# Patient Record
Sex: Female | Born: 1960 | Race: White | Hispanic: No | Marital: Married | Smoking: Never smoker
Health system: Southern US, Community
[De-identification: ages and names within clinical notes are randomized; demographics above are authoritative.]

## PROBLEM LIST (undated history)

## (undated) DIAGNOSIS — K121 Other forms of stomatitis: Secondary | ICD-10-CM

## (undated) DIAGNOSIS — M810 Age-related osteoporosis without current pathological fracture: Secondary | ICD-10-CM

## (undated) DIAGNOSIS — I10 Essential (primary) hypertension: Secondary | ICD-10-CM

## (undated) DIAGNOSIS — D649 Anemia, unspecified: Secondary | ICD-10-CM

## (undated) DIAGNOSIS — F32A Depression, unspecified: Secondary | ICD-10-CM

## (undated) DIAGNOSIS — M8580 Other specified disorders of bone density and structure, unspecified site: Secondary | ICD-10-CM

## (undated) DIAGNOSIS — F329 Major depressive disorder, single episode, unspecified: Secondary | ICD-10-CM

## (undated) DIAGNOSIS — F419 Anxiety disorder, unspecified: Secondary | ICD-10-CM

## (undated) HISTORY — PX: TONSILLECTOMY: SUR1361

## (undated) HISTORY — DX: Other specified disorders of bone density and structure, unspecified site: M85.80

## (undated) HISTORY — DX: Other forms of stomatitis: K12.1

## (undated) HISTORY — DX: Major depressive disorder, single episode, unspecified: F32.9

## (undated) HISTORY — PX: HERNIA REPAIR: SHX51

## (undated) HISTORY — DX: Anxiety disorder, unspecified: F41.9

## (undated) HISTORY — DX: Anemia, unspecified: D64.9

## (undated) HISTORY — DX: Depression, unspecified: F32.A

## (undated) HISTORY — DX: Essential (primary) hypertension: I10

## (undated) HISTORY — DX: Age-related osteoporosis without current pathological fracture: M81.0

---

## 1983-04-12 HISTORY — PX: FOOT SURGERY: SHX648

## 1997-08-26 ENCOUNTER — Other Ambulatory Visit: Admission: RE | Admit: 1997-08-26 | Discharge: 1997-08-26 | Payer: Self-pay | Admitting: Obstetrics and Gynecology

## 1998-10-08 ENCOUNTER — Other Ambulatory Visit: Admission: RE | Admit: 1998-10-08 | Discharge: 1998-10-08 | Payer: Self-pay | Admitting: Obstetrics and Gynecology

## 1999-10-12 ENCOUNTER — Other Ambulatory Visit: Admission: RE | Admit: 1999-10-12 | Discharge: 1999-10-12 | Payer: Self-pay | Admitting: Gynecology

## 2000-11-27 ENCOUNTER — Other Ambulatory Visit: Admission: RE | Admit: 2000-11-27 | Discharge: 2000-11-27 | Payer: Self-pay | Admitting: Gynecology

## 2002-06-03 ENCOUNTER — Other Ambulatory Visit: Admission: RE | Admit: 2002-06-03 | Discharge: 2002-06-03 | Payer: Self-pay | Admitting: Gynecology

## 2002-08-12 ENCOUNTER — Encounter: Payer: Self-pay | Admitting: Gynecology

## 2002-08-12 ENCOUNTER — Ambulatory Visit (HOSPITAL_COMMUNITY): Admission: RE | Admit: 2002-08-12 | Discharge: 2002-08-12 | Payer: Self-pay | Admitting: Gynecology

## 2003-10-10 ENCOUNTER — Ambulatory Visit (HOSPITAL_COMMUNITY): Admission: RE | Admit: 2003-10-10 | Discharge: 2003-10-10 | Payer: Self-pay | Admitting: Gynecology

## 2003-12-30 ENCOUNTER — Other Ambulatory Visit: Admission: RE | Admit: 2003-12-30 | Discharge: 2003-12-30 | Payer: Self-pay | Admitting: Gynecology

## 2004-12-06 ENCOUNTER — Ambulatory Visit (HOSPITAL_COMMUNITY): Admission: RE | Admit: 2004-12-06 | Discharge: 2004-12-06 | Payer: Self-pay | Admitting: Gynecology

## 2005-02-18 ENCOUNTER — Other Ambulatory Visit: Admission: RE | Admit: 2005-02-18 | Discharge: 2005-02-18 | Payer: Self-pay | Admitting: Gynecology

## 2005-03-25 ENCOUNTER — Encounter: Admission: RE | Admit: 2005-03-25 | Discharge: 2005-03-25 | Payer: Self-pay | Admitting: Gynecology

## 2005-09-23 ENCOUNTER — Encounter: Admission: RE | Admit: 2005-09-23 | Discharge: 2005-09-23 | Payer: Self-pay | Admitting: Gynecology

## 2006-01-06 ENCOUNTER — Ambulatory Visit (HOSPITAL_COMMUNITY): Admission: RE | Admit: 2006-01-06 | Discharge: 2006-01-06 | Payer: Self-pay | Admitting: Gynecology

## 2006-02-20 ENCOUNTER — Other Ambulatory Visit: Admission: RE | Admit: 2006-02-20 | Discharge: 2006-02-20 | Payer: Self-pay | Admitting: Gynecology

## 2007-01-09 ENCOUNTER — Ambulatory Visit (HOSPITAL_COMMUNITY): Admission: RE | Admit: 2007-01-09 | Discharge: 2007-01-09 | Payer: Self-pay | Admitting: Gynecology

## 2007-03-12 ENCOUNTER — Other Ambulatory Visit: Admission: RE | Admit: 2007-03-12 | Discharge: 2007-03-12 | Payer: Self-pay | Admitting: Gynecology

## 2007-12-21 ENCOUNTER — Ambulatory Visit: Payer: Self-pay | Admitting: Women's Health

## 2008-01-25 ENCOUNTER — Ambulatory Visit (HOSPITAL_COMMUNITY): Admission: RE | Admit: 2008-01-25 | Discharge: 2008-01-25 | Payer: Self-pay | Admitting: Gynecology

## 2008-05-02 ENCOUNTER — Other Ambulatory Visit: Admission: RE | Admit: 2008-05-02 | Discharge: 2008-05-02 | Payer: Self-pay | Admitting: Gynecology

## 2008-05-02 ENCOUNTER — Ambulatory Visit: Payer: Self-pay | Admitting: Women's Health

## 2008-05-02 ENCOUNTER — Encounter: Payer: Self-pay | Admitting: Women's Health

## 2009-01-30 ENCOUNTER — Ambulatory Visit: Payer: Self-pay | Admitting: Women's Health

## 2009-02-04 ENCOUNTER — Ambulatory Visit (HOSPITAL_COMMUNITY): Admission: RE | Admit: 2009-02-04 | Discharge: 2009-02-04 | Payer: Self-pay | Admitting: Gynecology

## 2009-05-08 ENCOUNTER — Other Ambulatory Visit: Admission: RE | Admit: 2009-05-08 | Discharge: 2009-05-08 | Payer: Self-pay | Admitting: Gynecology

## 2009-05-08 ENCOUNTER — Ambulatory Visit: Payer: Self-pay | Admitting: Women's Health

## 2009-09-11 ENCOUNTER — Ambulatory Visit: Payer: Self-pay | Admitting: Women's Health

## 2009-09-24 ENCOUNTER — Ambulatory Visit: Payer: Self-pay | Admitting: Women's Health

## 2009-11-11 ENCOUNTER — Other Ambulatory Visit: Admission: RE | Admit: 2009-11-11 | Discharge: 2009-11-11 | Payer: Self-pay | Admitting: Gynecology

## 2009-11-11 ENCOUNTER — Ambulatory Visit: Payer: Self-pay | Admitting: Women's Health

## 2010-02-05 ENCOUNTER — Ambulatory Visit (HOSPITAL_COMMUNITY): Admission: RE | Admit: 2010-02-05 | Discharge: 2010-02-05 | Payer: Self-pay | Admitting: Gynecology

## 2010-05-12 ENCOUNTER — Other Ambulatory Visit (HOSPITAL_COMMUNITY)
Admission: RE | Admit: 2010-05-12 | Discharge: 2010-05-12 | Disposition: A | Discharge status: Home / Self Care | Payer: BC Managed Care – PPO | Source: Ambulatory Visit | Attending: Gynecology | Admitting: Gynecology

## 2010-05-12 ENCOUNTER — Other Ambulatory Visit: Payer: Self-pay | Admitting: Women's Health

## 2010-05-12 ENCOUNTER — Encounter: Payer: Self-pay | Admitting: Women's Health

## 2010-05-12 ENCOUNTER — Encounter (INDEPENDENT_AMBULATORY_CARE_PROVIDER_SITE_OTHER): Payer: BC Managed Care – PPO | Admitting: Women's Health

## 2010-05-12 ENCOUNTER — Ambulatory Visit: Admit: 2010-05-12 | Payer: Self-pay | Admitting: Women's Health

## 2010-05-12 DIAGNOSIS — Z01419 Encounter for gynecological examination (general) (routine) without abnormal findings: Secondary | ICD-10-CM

## 2010-05-12 DIAGNOSIS — Z13220 Encounter for screening for lipoid disorders: Secondary | ICD-10-CM

## 2010-05-12 DIAGNOSIS — Z833 Family history of diabetes mellitus: Secondary | ICD-10-CM

## 2010-05-12 DIAGNOSIS — R87619 Unspecified abnormal cytological findings in specimens from cervix uteri: Secondary | ICD-10-CM | POA: Insufficient documentation

## 2011-01-21 ENCOUNTER — Other Ambulatory Visit: Payer: Self-pay | Admitting: Gynecology

## 2011-01-21 DIAGNOSIS — Z1231 Encounter for screening mammogram for malignant neoplasm of breast: Secondary | ICD-10-CM

## 2011-02-11 ENCOUNTER — Ambulatory Visit (HOSPITAL_COMMUNITY)
Admission: RE | Admit: 2011-02-11 | Discharge: 2011-02-11 | Disposition: A | Discharge status: Home / Self Care | Payer: BC Managed Care – PPO | Source: Ambulatory Visit | Attending: Gynecology | Admitting: Gynecology

## 2011-02-11 DIAGNOSIS — Z1231 Encounter for screening mammogram for malignant neoplasm of breast: Secondary | ICD-10-CM | POA: Insufficient documentation

## 2011-07-15 ENCOUNTER — Encounter: Payer: Self-pay | Admitting: Women's Health

## 2011-07-15 ENCOUNTER — Ambulatory Visit (INDEPENDENT_AMBULATORY_CARE_PROVIDER_SITE_OTHER): Payer: BC Managed Care – PPO | Admitting: Women's Health

## 2011-07-15 ENCOUNTER — Other Ambulatory Visit (HOSPITAL_COMMUNITY)
Admission: RE | Admit: 2011-07-15 | Discharge: 2011-07-15 | Disposition: A | Discharge status: Home / Self Care | Payer: BC Managed Care – PPO | Source: Ambulatory Visit | Attending: Obstetrics and Gynecology | Admitting: Obstetrics and Gynecology

## 2011-07-15 VITALS — BP 118/70 | Ht 67.0 in | Wt 108.0 lb

## 2011-07-15 DIAGNOSIS — N912 Amenorrhea, unspecified: Secondary | ICD-10-CM

## 2011-07-15 DIAGNOSIS — Z833 Family history of diabetes mellitus: Secondary | ICD-10-CM

## 2011-07-15 DIAGNOSIS — F419 Anxiety disorder, unspecified: Secondary | ICD-10-CM | POA: Insufficient documentation

## 2011-07-15 DIAGNOSIS — Z01419 Encounter for gynecological examination (general) (routine) without abnormal findings: Secondary | ICD-10-CM | POA: Insufficient documentation

## 2011-07-15 DIAGNOSIS — F411 Generalized anxiety disorder: Secondary | ICD-10-CM

## 2011-07-15 DIAGNOSIS — E079 Disorder of thyroid, unspecified: Secondary | ICD-10-CM

## 2011-07-15 NOTE — Progress Notes (Signed)
Lindsey Sanford 09-28-1960 [id number]    History:    The patient presents for annual exam. Regular monthly 3-4 day cycle until January. Amenorrheic for 3 months with increased hot flushes. History of normal mammograms, ascus on Pap in 2000 with normal Paps since. History of anxiety and depression currently without symptoms.  Past medical history, past surgical history, family history and social history were all reviewed and documented in the EPIC chart. 2 daughters doing well, one daughter graduating from [employer] planning to go to med school. Son doing well academically although having a few issues at  [facility].   ROS:  A  ROS was performed and pertinent positives and negatives are included in the history.  Exam:  Filed Vitals:   07/15/11 1104  BP: 118/70    General appearance:  Normal Head/Neck:  Normal, without cervical or supraclavicular adenopathy. Thyroid:  Symmetrical, normal in size, without palpable masses or nodularity. Respiratory  Effort:  Normal  Auscultation:  Clear without wheezing or rhonchi Cardiovascular  Auscultation:  Regular rate, without rubs, murmurs or gallops  Edema/varicosities:  Not grossly evident Abdominal  Soft,nontender, without masses, guarding or rebound.  Liver/spleen:  No organomegaly noted  Hernia:  None appreciated  Skin  Inspection:  Grossly normal  Palpation:  Grossly normal Neurologic/psychiatric  Orientation:  Normal with appropriate conversation.  Mood/affect:  Normal  Genitourinary    Breasts: Examined lying and sitting.     Right: Without masses, retractions, discharge or axillary adenopathy.     Left: Without masses, retractions, discharge or axillary adenopathy.   Inguinal/mons:  Normal without inguinal adenopathy  External genitalia:  Normal  BUS/Urethra/Skene's glands:  Normal  Bladder:  Normal  Vagina:  Normal  Cervix:  Normal  Uterus:   normal in size, shape and contour.  Midline and mobile  Adnexa/parametria:      Rt: Without masses or tenderness.   Lt: Without masses or tenderness.  Anus and perineum: Normal  Digital rectal exam: Normal sphincter tone without palpated masses or tenderness  Assessment/Plan:  51 y.o.  MWF G3 P3 for annual exam amenorrheic for 3 months with menopausal symptoms.  Menopausal symptoms  Plan: CBC, FSH, TSH, glucose, UA and Pap. Instructed to call if further bleeding. If FSH elevated proceed with the density. Instructed to schedule screening colonoscopy. HRT pros, cons, discussed declines at this time will try vitamin E twice a day. SBE's, annual mammogram, calcium rich diet, continue exercise and vitamin D 2000 daily.    Harrington Challenger [facility], 12:45 PM 07/15/2011

## 2011-07-15 NOTE — Patient Instructions (Signed)

## 2011-07-16 LAB — URINALYSIS W MICROSCOPIC + REFLEX CULTURE
Casts: NONE SEEN
Crystals: NONE SEEN
Glucose, UA: NEGATIVE mg/dL
Hgb urine dipstick: NEGATIVE
Leukocytes, UA: NEGATIVE
Specific Gravity, Urine: 1.008 (ref 1.005–1.030)
pH: 7 (ref 5.0–8.0)

## 2011-07-16 LAB — CBC WITH DIFFERENTIAL/PLATELET
Basophils Relative: 1 % (ref 0–1)
Eosinophils Absolute: 0.1 10*3/uL (ref 0.0–0.7)
HCT: 43.5 % (ref 36.0–46.0)
Hemoglobin: 13.8 g/dL (ref 12.0–15.0)
Lymphs Abs: 1.6 10*3/uL (ref 0.7–4.0)
MCH: 29.2 pg (ref 26.0–34.0)
MCHC: 31.7 g/dL (ref 30.0–36.0)
Monocytes Absolute: 0.5 10*3/uL (ref 0.1–1.0)
Monocytes Relative: 7 % (ref 3–12)
Neutro Abs: 4.5 10*3/uL (ref 1.7–7.7)

## 2011-07-16 LAB — GLUCOSE, RANDOM: Glucose, Bld: 91 mg/dL (ref 70–99)

## 2011-10-06 ENCOUNTER — Telehealth: Payer: Self-pay | Admitting: *Deleted

## 2011-10-06 NOTE — Telephone Encounter (Signed)
Pt was told to call if bleeding should occur again,  Cycle started yesterday, very light, no cycle in 6 months prior to this cycle. Please advise

## 2011-10-07 NOTE — Telephone Encounter (Signed)
Telephone call, states had a light period she is still within the first year of no cycles. Had an elevated FSH in March having less hot flushes at this time. Reviewed definition of menopause is on no HRT instructed to call if further bleeding.

## 2012-05-10 ENCOUNTER — Other Ambulatory Visit: Payer: Self-pay | Admitting: Women's Health

## 2012-05-10 DIAGNOSIS — Z1231 Encounter for screening mammogram for malignant neoplasm of breast: Secondary | ICD-10-CM

## 2012-05-16 ENCOUNTER — Ambulatory Visit (HOSPITAL_COMMUNITY)
Admission: RE | Admit: 2012-05-16 | Discharge: 2012-05-16 | Disposition: A | Discharge status: Home / Self Care | Payer: BC Managed Care – PPO | Source: Ambulatory Visit | Attending: Women's Health | Admitting: Women's Health

## 2012-05-16 DIAGNOSIS — Z1231 Encounter for screening mammogram for malignant neoplasm of breast: Secondary | ICD-10-CM | POA: Insufficient documentation

## 2012-05-17 ENCOUNTER — Other Ambulatory Visit: Payer: Self-pay | Admitting: Women's Health

## 2012-05-17 DIAGNOSIS — R928 Other abnormal and inconclusive findings on diagnostic imaging of breast: Secondary | ICD-10-CM

## 2012-05-18 ENCOUNTER — Other Ambulatory Visit: Payer: Self-pay | Admitting: *Deleted

## 2012-05-18 DIAGNOSIS — N63 Unspecified lump in unspecified breast: Secondary | ICD-10-CM

## 2012-05-30 ENCOUNTER — Ambulatory Visit
Admission: RE | Admit: 2012-05-30 | Discharge: 2012-05-30 | Disposition: A | Discharge status: Home / Self Care | Payer: BC Managed Care – PPO | Source: Ambulatory Visit | Attending: Women's Health | Admitting: Women's Health

## 2012-07-20 ENCOUNTER — Ambulatory Visit (INDEPENDENT_AMBULATORY_CARE_PROVIDER_SITE_OTHER): Payer: BC Managed Care – PPO | Admitting: Women's Health

## 2012-07-20 ENCOUNTER — Encounter: Payer: Self-pay | Admitting: Women's Health

## 2012-07-20 VITALS — BP 132/62 | Ht 67.0 in | Wt 115.0 lb

## 2012-07-20 DIAGNOSIS — Z13220 Encounter for screening for lipoid disorders: Secondary | ICD-10-CM

## 2012-07-20 DIAGNOSIS — Z833 Family history of diabetes mellitus: Secondary | ICD-10-CM

## 2012-07-20 DIAGNOSIS — Z01419 Encounter for gynecological examination (general) (routine) without abnormal findings: Secondary | ICD-10-CM

## 2012-07-20 LAB — CBC WITH DIFFERENTIAL/PLATELET
Basophils Absolute: 0.1 10*3/uL (ref 0.0–0.1)
Basophils Relative: 2 % — ABNORMAL HIGH (ref 0–1)
Eosinophils Absolute: 0.1 10*3/uL (ref 0.0–0.7)
Eosinophils Relative: 3 % (ref 0–5)
HCT: 41.8 % (ref 36.0–46.0)
Hemoglobin: 13.8 g/dL (ref 12.0–15.0)
MCH: 28.8 pg (ref 26.0–34.0)
MCHC: 33 g/dL (ref 30.0–36.0)
MCV: 87.1 fL (ref 78.0–100.0)
Monocytes Absolute: 0.4 10*3/uL (ref 0.1–1.0)
Monocytes Relative: 8 % (ref 3–12)
Neutro Abs: 2.3 10*3/uL (ref 1.7–7.7)
RDW: 14.1 % (ref 11.5–15.5)

## 2012-07-20 LAB — LIPID PANEL
LDL Cholesterol: 94 mg/dL (ref 0–99)
Total CHOL/HDL Ratio: 2.1 Ratio
VLDL: 8 mg/dL (ref 0–40)

## 2012-07-20 NOTE — Progress Notes (Signed)
RICHARD HOLZ 04/10/1961 [id number]    History:    The patient presents for annual exam.  Regular monthly cycle since last  May, had skipped several cycles had an elevated FSH of 69, 07/2011 with hot flushes which have now resolved. History of normal Paps and mammograms. Has not had a colonoscopy. Has had some problems with depression, doing better on no medication.   Past medical history, past surgical history, family history and social history were all reviewed and documented in the EPIC chart. 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 Judie Grieve at [employer] doing much better. Katie graduated from [employer], Lowella Dandy at [employer] applying to med school doing well.   ROS:  A  ROS was performed and pertinent positives and negatives are included in the history.  Exam:  Filed Vitals:   07/20/12 0911  BP: 132/62    General appearance:  Normal Head/Neck:  Normal, without cervical or supraclavicular adenopathy. Thyroid:  Symmetrical, normal in size, without palpable masses or nodularity. Respiratory  Effort:  Normal  Auscultation:  Clear without wheezing or rhonchi Cardiovascular  Auscultation:  Regular rate, without rubs, murmurs or gallops  Edema/varicosities:  Not grossly evident Abdominal  Soft,nontender, without masses, guarding or rebound.  Liver/spleen:  No organomegaly noted  Hernia:  None appreciated  Skin  Inspection:  Grossly normal  Palpation:  Grossly normal Neurologic/psychiatric  Orientation:  Normal with appropriate conversation.  Mood/affect:  Normal  Genitourinary    Breasts: Examined lying and sitting.     Right: Without masses, retractions, discharge or axillary adenopathy.     Left: Without masses, retractions, discharge or axillary adenopathy.   Inguinal/mons:  Normal without inguinal adenopathy  External genitalia:  Normal  BUS/Urethra/Skene's glands:  Normal  Bladder:  Normal  Vagina:  Normal  Cervix:  Normal  Uterus:   normal in size, shape and contour.   Midline and mobile  Adnexa/parametria:     Rt: Without masses or tenderness.   Lt: Without masses or tenderness.  Anus and perineum: Normal  Digital rectal exam: Normal sphincter tone without palpated masses or tenderness  Assessment/Plan:  52 y.o.  M. WF G3 P99for annual exam with no complaints.  Normal GYN exam Anxiety and depression stable on no medication  Plan: Menopause reviewed, instructed to return to office after 3 minutes cycles for repeat FSH. CBC, glucose, lipid panel, UA, Pap normal 2013, new screening guidelines reviewed. SBE's, continue annual mammogram, calcium rich diet, vitamin D 2000 daily and regular exercise encouraged. Instructed to schedule screening colonoscopy, reviewed importance of screening.     Harrington Challenger [facility], 1:37 PM 07/20/2012

## 2012-07-20 NOTE — Patient Instructions (Addendum)

## 2012-07-21 LAB — URINALYSIS W MICROSCOPIC + REFLEX CULTURE
Bilirubin Urine: NEGATIVE
Crystals: NONE SEEN
Glucose, UA: NEGATIVE mg/dL
Leukocytes, UA: NEGATIVE
Protein, ur: NEGATIVE mg/dL
Specific Gravity, Urine: 1.019 (ref 1.005–1.030)
Squamous Epithelial / LPF: NONE SEEN
pH: 5.5 (ref 5.0–8.0)

## 2012-07-24 LAB — URINE CULTURE

## 2012-07-25 ENCOUNTER — Other Ambulatory Visit: Payer: Self-pay | Admitting: Women's Health

## 2012-07-25 DIAGNOSIS — N390 Urinary tract infection, site not specified: Secondary | ICD-10-CM

## 2012-07-25 MED ORDER — CIPROFLOXACIN HCL 250 MG PO TABS: 250.0000 mg | ORAL_TABLET | Freq: Two times a day (BID) | ORAL | Status: DC

## 2012-07-26 ENCOUNTER — Encounter: Payer: Self-pay | Admitting: Women's Health

## 2012-08-10 ENCOUNTER — Other Ambulatory Visit: Payer: BC Managed Care – PPO

## 2012-08-10 DIAGNOSIS — N390 Urinary tract infection, site not specified: Secondary | ICD-10-CM

## 2012-08-11 LAB — URINALYSIS W MICROSCOPIC + REFLEX CULTURE
Bacteria, UA: NONE SEEN
Casts: NONE SEEN
Crystals: NONE SEEN
Glucose, UA: NEGATIVE mg/dL
Hgb urine dipstick: NEGATIVE
Ketones, ur: NEGATIVE mg/dL
Leukocytes, UA: NEGATIVE
Nitrite: NEGATIVE
Specific Gravity, Urine: 1.007 (ref 1.005–1.030)
pH: 7.5 (ref 5.0–8.0)

## 2013-02-14 ENCOUNTER — Other Ambulatory Visit: Payer: Self-pay

## 2013-05-22 ENCOUNTER — Other Ambulatory Visit: Payer: Self-pay | Admitting: Women's Health

## 2013-05-22 DIAGNOSIS — Z1231 Encounter for screening mammogram for malignant neoplasm of breast: Secondary | ICD-10-CM

## 2013-05-29 ENCOUNTER — Ambulatory Visit (HOSPITAL_COMMUNITY)
Admission: RE | Admit: 2013-05-29 | Discharge: 2013-05-29 | Disposition: A | Discharge status: Home / Self Care | Payer: BC Managed Care – PPO | Source: Ambulatory Visit | Attending: Women's Health | Admitting: Women's Health

## 2013-05-29 DIAGNOSIS — Z1231 Encounter for screening mammogram for malignant neoplasm of breast: Secondary | ICD-10-CM | POA: Insufficient documentation

## 2013-05-30 ENCOUNTER — Encounter: Payer: Self-pay | Admitting: Women's Health

## 2013-10-18 ENCOUNTER — Encounter: Payer: Self-pay | Admitting: Women's Health

## 2013-10-18 ENCOUNTER — Ambulatory Visit (INDEPENDENT_AMBULATORY_CARE_PROVIDER_SITE_OTHER): Payer: BC Managed Care – PPO | Admitting: Women's Health

## 2013-10-18 VITALS — BP 134/86 | Ht 67.0 in | Wt 110.8 lb

## 2013-10-18 DIAGNOSIS — E079 Disorder of thyroid, unspecified: Secondary | ICD-10-CM

## 2013-10-18 DIAGNOSIS — Z01419 Encounter for gynecological examination (general) (routine) without abnormal findings: Secondary | ICD-10-CM

## 2013-10-18 DIAGNOSIS — M722 Plantar fascial fibromatosis: Secondary | ICD-10-CM

## 2013-10-18 DIAGNOSIS — F411 Generalized anxiety disorder: Secondary | ICD-10-CM

## 2013-10-18 DIAGNOSIS — Z13220 Encounter for screening for lipoid disorders: Secondary | ICD-10-CM

## 2013-10-18 LAB — LIPID PANEL
CHOL/HDL RATIO: 2.1 ratio
CHOLESTEROL: 158 mg/dL (ref 0–200)
HDL: 75 mg/dL (ref >39)
LDL Cholesterol: 71 mg/dL (ref 0–99)
Triglycerides: 61 mg/dL (ref <150)
VLDL: 12 mg/dL (ref 0–40)

## 2013-10-18 LAB — CBC WITH DIFFERENTIAL/PLATELET
Basophils Absolute: 0 10*3/uL (ref 0.0–0.1)
Basophils Relative: 1 % (ref 0–1)
EOS ABS: 0.1 10*3/uL (ref 0.0–0.7)
Eosinophils Relative: 3 % (ref 0–5)
HCT: 39.4 % (ref 36.0–46.0)
HEMOGLOBIN: 13.4 g/dL (ref 12.0–15.0)
LYMPHS ABS: 1.3 10*3/uL (ref 0.7–4.0)
LYMPHS PCT: 34 % (ref 12–46)
MCH: 28.7 pg (ref 26.0–34.0)
MCHC: 34 g/dL (ref 30.0–36.0)
MCV: 84.4 fL (ref 78.0–100.0)
MONOS PCT: 11 % (ref 3–12)
Monocytes Absolute: 0.4 10*3/uL (ref 0.1–1.0)
NEUTROS ABS: 2 10*3/uL (ref 1.7–7.7)
NEUTROS PCT: 51 % (ref 43–77)
Platelets: 229 10*3/uL (ref 150–400)
RBC: 4.67 MIL/uL (ref 3.87–5.11)
RDW: 13.4 % (ref 11.5–15.5)
WBC: 3.9 10*3/uL — AB (ref 4.0–10.5)

## 2013-10-18 LAB — COMPREHENSIVE METABOLIC PANEL
ALT: 15 U/L (ref 0–35)
AST: 17 U/L (ref 0–37)
Albumin: 3.9 g/dL (ref 3.5–5.2)
Alkaline Phosphatase: 49 U/L (ref 39–117)
BILIRUBIN TOTAL: 0.5 mg/dL (ref 0.2–1.2)
BUN: 12 mg/dL (ref 6–23)
CALCIUM: 8.7 mg/dL (ref 8.4–10.5)
CO2: 24 meq/L (ref 19–32)
CREATININE: 0.82 mg/dL (ref 0.50–1.10)
Chloride: 103 mEq/L (ref 96–112)
Glucose, Bld: 97 mg/dL (ref 70–99)
Potassium: 3.6 mEq/L (ref 3.5–5.3)
Sodium: 135 mEq/L (ref 135–145)
Total Protein: 6.7 g/dL (ref 6.0–8.3)

## 2013-10-18 LAB — TSH: TSH: 2.043 u[IU]/mL (ref 0.350–4.500)

## 2013-10-18 MED ORDER — ESCITALOPRAM OXALATE 10 MG PO TABS: 10.0000 mg | ORAL_TABLET | Freq: Every day | ORAL | Status: DC

## 2013-10-18 NOTE — Patient Instructions (Signed)
Health Recommendations for Postmenopausal Women Respected and ongoing research has looked at the most common causes of death, disability, and poor quality of life in postmenopausal women. The causes include heart disease, diseases of blood vessels, diabetes, depression, cancer, and bone loss (osteoporosis). Many things can be done to help lower the chances of developing these and other common problems: CARDIOVASCULAR DISEASE Heart Disease: A heart attack is a medical emergency. Know the signs and symptoms of a heart attack. Below are things women can do to reduce their risk for heart disease.   Do not smoke. If you smoke, quit.  Aim for a healthy weight. Being overweight causes many preventable deaths. Eat a healthy and balanced diet and drink an adequate amount of liquids.  Get moving. Make a commitment to be more physically active. Aim for 30 minutes of activity on most, if not all days of the week.  Eat for heart health. Choose a diet that is low in saturated fat and cholesterol and eliminate trans fat. Include whole grains, vegetables, and fruits. Read and understand the labels on food containers before buying.  Know your numbers. Ask your caregiver to check your blood pressure, cholesterol (total, HDL, LDL, triglycerides) and blood glucose. Work with your caregiver on improving your entire clinical picture.  High blood pressure. Limit or stop your table salt intake (try salt substitute and food seasonings). Avoid salty foods and drinks. Read labels on food containers before buying. Eating well and exercising can help control high blood pressure. STROKE  Stroke is a medical emergency. Stroke may be the result of a blood clot in a blood vessel in the brain or by a brain hemorrhage (bleeding). Know the signs and symptoms of a stroke. To lower the risk of developing a stroke:  Avoid fatty foods.  Quit smoking.  Control your diabetes, blood pressure, and irregular heart rate. THROMBOPHLEBITIS  (BLOOD CLOT) OF THE LEG  Becoming overweight and leading a stationary lifestyle may also contribute to developing blood clots. Controlling your diet and exercising will help lower the risk of developing blood clots. CANCER SCREENING  Breast Cancer: Take steps to reduce your risk of breast cancer.  You should practice "breast self-awareness." This means understanding the normal appearance and feel of your breasts and should include breast self-examination. Any changes detected, no matter how small, should be reported to your caregiver.  After age 40, you should have a clinical breast exam (CBE) every year.  Starting at age 40, you should consider having a mammogram (breast X-ray) every year.  If you have a family history of breast cancer, talk to your caregiver about genetic screening.  If you are at high risk for breast cancer, talk to your caregiver about having an MRI and a mammogram every year.  Intestinal or Stomach Cancer: Tests to consider are a rectal exam, fecal occult blood, sigmoidoscopy, and colonoscopy. Women who are high risk may need to be screened at an earlier age and more often.  Cervical Cancer:  Beginning at age 30, you should have a Pap test every 3 years as long as the past 3 Pap tests have been normal.  If you have had past treatment for cervical cancer or a condition that could lead to cancer, you need Pap tests and screening for cancer for at least 20 years after your treatment.  If you had a hysterectomy for a problem that was not cancer or a condition that could lead to cancer, then you no longer need Pap tests.    If you are between ages 65 and 70, and you have had normal Pap tests going back 10 years, you no longer need Pap tests.  If Pap tests have been discontinued, risk factors (such as a new sexual partner) need to be reassessed to determine if screening should be resumed.  Some medical problems can increase the chance of getting cervical cancer. In these  cases, your caregiver may recommend more frequent screening and Pap tests.  Uterine Cancer: If you have vaginal bleeding after reaching menopause, you should notify your caregiver.  Ovarian cancer: Other than yearly pelvic exams, there are no reliable tests available to screen for ovarian cancer at this time except for yearly pelvic exams.  Lung Cancer: Yearly chest X-rays can detect lung cancer and should be done on high risk women, such as cigarette smokers and women with chronic lung disease (emphysema).  Skin Cancer: A complete body skin exam should be done at your yearly examination. Avoid overexposure to the sun and ultraviolet light lamps. Use a strong sun block cream when in the sun. All of these things are important in lowering the risk of skin cancer. MENOPAUSE Menopause Symptoms: Hormone therapy products are effective for treating symptoms associated with menopause:  Moderate to severe hot flashes.  Night sweats.  Mood swings.  Headaches.  Tiredness.  Loss of sex drive.  Insomnia.  Other symptoms. Hormone replacement carries certain risks, especially in older women. Women who use or are thinking about using estrogen or estrogen with progestin treatments should discuss that with their caregiver. Your caregiver will help you understand the benefits and risks. The ideal dose of hormone replacement therapy is not known. The Food and Drug Administration (FDA) has concluded that hormone therapy should be used only at the lowest doses and for the shortest amount of time to reach treatment goals.  OSTEOPOROSIS Protecting Against Bone Loss and Preventing Fracture: If you use hormone therapy for prevention of bone loss (osteoporosis), the risks for bone loss must outweigh the risk of the therapy. Ask your caregiver about other medications known to be safe and effective for preventing bone loss and fractures. To guard against bone loss or fractures, the following is recommended:  If  you are less than age 50, take 1000 mg of calcium and at least 600 mg of Vitamin D per day.  If you are greater than age 50 but less than age 70, take 1200 mg of calcium and at least 600 mg of Vitamin D per day.  If you are greater than age 70, take 1200 mg of calcium and at least 800 mg of Vitamin D per day. Smoking and excessive alcohol intake increases the risk of osteoporosis. Eat foods rich in calcium and vitamin D and do weight bearing exercises several times a week as your caregiver suggests. DIABETES Diabetes Melitus: If you have Type I or Type 2 diabetes, you should keep your blood sugar under control with diet, exercise and recommended medication. Avoid too many sweets, starchy and fatty foods. Being overweight can make control more difficult. COGNITION AND MEMORY Cognition and Memory: Menopausal hormone therapy is not recommended for the prevention of cognitive disorders such as Alzheimer's disease or memory loss.  DEPRESSION  Depression may occur at any age, but is common in elderly women. The reasons may be because of physical, medical, social (loneliness), or financial problems and needs. If you are experiencing depression because of medical problems and control of symptoms, talk to your caregiver about this. Physical activity and   exercise may help with mood and sleep. Community and volunteer involvement may help your sense of value and worth. If you have depression and you feel that the problem is getting worse or becoming severe, talk to your caregiver about treatment options that are best for you. ACCIDENTS  Accidents are common and can be serious in the elderly woman. Prepare your house to prevent accidents. Eliminate throw rugs, place hand bars in the bath, shower and toilet areas. Avoid wearing high heeled shoes or walking on wet, snowy, and icy areas. Limit or stop driving if you have vision or hearing problems, or you feel you are unsteady with you movements and  reflexes. HEPATITIS C Hepatitis C is a type of viral infection affecting the liver. It is spread mainly through contact with blood from an infected person. It can be treated, but if left untreated, it can lead to severe liver damage over years. Many people who are infected do not know that the virus is in their blood. If you are a "baby-boomer", it is recommended that you have one screening test for Hepatitis C. IMMUNIZATIONS  Several immunizations are important to consider having during your senior years, including:   Tetanus, diptheria, and pertussis booster shot.  Influenza every year before the flu season begins.  Pneumonia vaccine.  Shingles vaccine.  Others as indicated based on your specific needs. Talk to your caregiver about these. Document Released: 05/20/2005 Document Revised: 03/14/2012 Document Reviewed: 01/14/2008 [facility] Patient Information 2015 [place], [place]. This information is not intended to replace advice given to you by your health care provider. Make sure you discuss any questions you have with your health care provider.

## 2013-10-18 NOTE — Progress Notes (Signed)
Lindsey Sanford 1960/04/12 [id number]    History:    Presents for annual exam.  Continues to have irregular cycles, has had a cycle last 2-3 months, prior amenorrheic for 6 months. Diaphragm. Vasomotor symptoms. Continues to struggle with anxiety and depression has been resistant to medication but would like to try. Reports home life, children all doing well, job change in past year.  Normal Pap and mammogram history. Has not had a colonoscopy  Past medical history, past surgical history, family history and social history were all reviewed and documented in the EPIC chart. 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 [name] starting med school, Scientist, research (physical sciences). graduating from [employer] graduating from [employer].  Father diabetes.  ROS:  A  12 point ROS was performed and pertinent positives and negatives are included.  Exam:  Filed Vitals:   10/18/13 0813  BP: 134/86    General appearance:  Normal Thyroid:  Symmetrical, normal in size, without palpable masses or nodularity. Respiratory  Auscultation:  Clear without wheezing or rhonchi Cardiovascular  Auscultation:  Regular rate, without rubs, murmurs or gallops  Edema/varicosities:  Not grossly evident Abdominal  Soft,nontender, without masses, guarding or rebound.  Liver/spleen:  No organomegaly noted  Hernia:  None appreciated  Skin  Inspection:  Grossly normal   Breasts: Examined lying and sitting.     Right: Without masses, retractions, discharge or axillary adenopathy.     Left: Without masses, retractions, discharge or axillary adenopathy. Gentitourinary   Inguinal/mons:  Normal without inguinal adenopathy  External genitalia:  Normal  BUS/Urethra/Skene's glands:  Normal  Vagina:  Normal  Cervix:  Normal  Uterus:   normal in size, shape and contour.  Midline and mobile  Adnexa/parametria:     Rt: Without masses or tenderness.   Lt: Without masses or tenderness.  Anus and perineum: Normal  Digital rectal exam: Normal sphincter  tone without palpated masses or tenderness  Assessment/Plan:  53 y.o. MWF G3P3 for annual exam.     Perimenopausal/diaphragm for contraception Anxiety/depression  Plan: Counseling suggested, Lexapro 10 mg start with half tablet daily and increase to full tablet after 2 weeks. Reviewed importance of self-care, increasing leisure activities, delligating at work. SBE's, continue annual mammogram,3D tomography reviewed and encouraged history of dense breast. Calcium rich diet, vitamin D 2000 daily encouraged. CBC, comprehensive metabolic panel, TSH, lipid panel, UA, Pap normal 2013, new screening guidelines reviewed. Aware of importance of screening colonoscopy will schedule.   Note: This dictation was prepared with Dragon/digital dictation.  Any transcriptional errors that result are unintentional. Huel Cote [facility], 9:30 AM 10/18/2013

## 2013-10-19 LAB — URINALYSIS W MICROSCOPIC + REFLEX CULTURE
BILIRUBIN URINE: NEGATIVE
Bacteria, UA: NONE SEEN
Casts: NONE SEEN
Crystals: NONE SEEN
Glucose, UA: NEGATIVE mg/dL
HGB URINE DIPSTICK: NEGATIVE
Ketones, ur: NEGATIVE mg/dL
Leukocytes, UA: NEGATIVE
Nitrite: NEGATIVE
PROTEIN: NEGATIVE mg/dL
Specific Gravity, Urine: 1.015 (ref 1.005–1.030)
Urobilinogen, UA: 0.2 mg/dL (ref 0.0–1.0)
pH: 6 (ref 5.0–8.0)

## 2013-10-21 ENCOUNTER — Encounter: Payer: Self-pay | Admitting: Women's Health

## 2013-10-24 ENCOUNTER — Telehealth: Payer: Self-pay | Admitting: *Deleted

## 2013-10-24 NOTE — Telephone Encounter (Signed)
Please call and have her take half tablet daily, may need to take every other day if she is taking a half tablet now and increase more gradually.

## 2013-10-24 NOTE — Telephone Encounter (Signed)
Pt informed with the below note. 

## 2013-10-24 NOTE — Telephone Encounter (Signed)
Pt was prescribed lexapro 10 mg on 10/18/13 she asked if feeling "sick is normal'? C/o body weakness, fatigue if this is common for body to adjust to medication. Please advise

## 2013-11-12 ENCOUNTER — Telehealth: Payer: Self-pay | Admitting: *Deleted

## 2013-11-12 NOTE — Telephone Encounter (Signed)
I called and informed her that her orthotics are here.  She asked if we open at 8 in the morning.  I told her yes.  She stated thanks for calling.

## 2013-11-12 NOTE — Telephone Encounter (Signed)
I sent a pair of orthotics out to be refurbished.  Just checking to see if they are back in the office.

## 2013-12-25 ENCOUNTER — Telehealth: Payer: Self-pay | Admitting: *Deleted

## 2013-12-25 NOTE — Telephone Encounter (Signed)
Pt called requesting name of orthopedic office, I left message on pt voicemail [facility]al[facility] with number [phone]

## 2014-02-10 ENCOUNTER — Encounter: Payer: Self-pay | Admitting: Women's Health

## 2014-05-13 ENCOUNTER — Other Ambulatory Visit: Payer: Self-pay | Admitting: Orthopedic Surgery

## 2014-05-13 DIAGNOSIS — M25511 Pain in right shoulder: Secondary | ICD-10-CM

## 2014-06-02 ENCOUNTER — Ambulatory Visit
Admission: RE | Admit: 2014-06-02 | Discharge: 2014-06-02 | Disposition: A | Discharge status: Home / Self Care | Payer: PRIVATE HEALTH INSURANCE | Source: Ambulatory Visit | Attending: Orthopedic Surgery | Admitting: Orthopedic Surgery

## 2014-06-02 DIAGNOSIS — M25511 Pain in right shoulder: Secondary | ICD-10-CM

## 2014-08-12 ENCOUNTER — Other Ambulatory Visit: Payer: Self-pay | Admitting: Women's Health

## 2014-08-12 DIAGNOSIS — Z1231 Encounter for screening mammogram for malignant neoplasm of breast: Secondary | ICD-10-CM

## 2014-08-22 ENCOUNTER — Ambulatory Visit (HOSPITAL_COMMUNITY)
Admission: RE | Admit: 2014-08-22 | Discharge: 2014-08-22 | Disposition: A | Discharge status: Home / Self Care | Payer: PRIVATE HEALTH INSURANCE | Source: Ambulatory Visit | Attending: Women's Health | Admitting: Women's Health

## 2014-08-22 DIAGNOSIS — Z1231 Encounter for screening mammogram for malignant neoplasm of breast: Secondary | ICD-10-CM | POA: Insufficient documentation

## 2014-11-05 ENCOUNTER — Ambulatory Visit (INDEPENDENT_AMBULATORY_CARE_PROVIDER_SITE_OTHER): Payer: PRIVATE HEALTH INSURANCE | Admitting: Women's Health

## 2014-11-05 ENCOUNTER — Other Ambulatory Visit (HOSPITAL_COMMUNITY)
Admission: RE | Admit: 2014-11-05 | Discharge: 2014-11-05 | Disposition: A | Discharge status: Home / Self Care | Payer: PRIVATE HEALTH INSURANCE | Source: Ambulatory Visit | Attending: Women's Health | Admitting: Women's Health

## 2014-11-05 ENCOUNTER — Encounter: Payer: Self-pay | Admitting: Women's Health

## 2014-11-05 VITALS — BP 118/80 | Ht 67.0 in | Wt 113.0 lb

## 2014-11-05 DIAGNOSIS — Z1151 Encounter for screening for human papillomavirus (HPV): Secondary | ICD-10-CM | POA: Insufficient documentation

## 2014-11-05 DIAGNOSIS — Z01419 Encounter for gynecological examination (general) (routine) without abnormal findings: Secondary | ICD-10-CM | POA: Diagnosis present

## 2014-11-05 LAB — CBC WITH DIFFERENTIAL/PLATELET
BASOS ABS: 0.1 10*3/uL (ref 0.0–0.1)
Basophils Relative: 2 % — ABNORMAL HIGH (ref 0–1)
EOS ABS: 0.2 10*3/uL (ref 0.0–0.7)
Eosinophils Relative: 4 % (ref 0–5)
HCT: 39.2 % (ref 36.0–46.0)
Hemoglobin: 13.2 g/dL (ref 12.0–15.0)
LYMPHS ABS: 1.7 10*3/uL (ref 0.7–4.0)
Lymphocytes Relative: 36 % (ref 12–46)
MCH: 29.3 pg (ref 26.0–34.0)
MCHC: 33.7 g/dL (ref 30.0–36.0)
MCV: 87.1 fL (ref 78.0–100.0)
MONO ABS: 0.4 10*3/uL (ref 0.1–1.0)
MPV: 9 fL (ref 8.6–12.4)
Monocytes Relative: 8 % (ref 3–12)
NEUTROS ABS: 2.4 10*3/uL (ref 1.7–7.7)
NEUTROS PCT: 50 % (ref 43–77)
Platelets: 237 10*3/uL (ref 150–400)
RBC: 4.5 MIL/uL (ref 3.87–5.11)
RDW: 13.8 % (ref 11.5–15.5)
WBC: 4.8 10*3/uL (ref 4.0–10.5)

## 2014-11-05 NOTE — Progress Notes (Signed)
Lindsey Sanford 1961/01/19 [id number]    History:    Presents for annual exam.  Postmenopausal 1 year with elevated FSH. Has not had a colonoscopy. History of  Pap in 2011 ascus with negative HR HPV  and normal mammograms.  Past medical history, past surgical history, family history and social history were all reviewed and documented in the EPIC chart. 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 Lindsey Sanford med school at [employer] graduated from [employer] living in [place] doing well. 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 Father diabetes. Mother doing well , living at [facility].  ROS:  A ROS was performed and pertinent positives and negatives are included.  Exam:  Filed Vitals:   11/05/14 0857  BP: 118/80    General appearance:  Normal Thyroid:  Symmetrical, normal in size, without palpable masses or nodularity. Respiratory  Auscultation:  Clear without wheezing or rhonchi Cardiovascular  Auscultation:  Regular rate, without rubs, murmurs or gallops  Edema/varicosities:  Not grossly evident Abdominal  Soft,nontender, without masses, guarding or rebound.  Liver/spleen:  No organomegaly noted  Hernia:  None appreciated  Skin  Inspection:  Grossly normal   Breasts: Examined lying and sitting.     Right: Without masses, retractions, discharge or axillary adenopathy.     Left: Without masses, retractions, discharge or axillary adenopathy. Gentitourinary   Inguinal/mons:  Normal without inguinal adenopathy  External genitalia:  Normal  BUS/Urethra/Skene's glands:  Normal  Vagina:  Normal  Cervix:  Normal  Uterus:  normal in size, shape and contour.  Midline and mobile  Adnexa/parametria:     Rt: Without masses or tenderness.   Lt: Without masses or tenderness.  Anus and perineum: Normal  Digital rectal exam: Normal sphincter tone without palpated masses or tenderness  Assessment/Plan:  54 y.o. MWF G3P3 for annual exam.     Postmenopausal/no  bleeding greater than one year with elevated FSH  Menopausal symptoms Anxiety/depression-no medication  [place]: Options for menopausal symptoms reviewed HRT, declines. Reviewed importance of continuing regular exercise, leisure activities, vaginal lubricants with intercourse. SBE's, continue annual screening mammogram, 3-D tomography reviewed and encouraged history of dense breasts. CBC, CMP, vitamin D, UA, Pap with HR HPV typing new screening guidelines reviewed. (Excellent lipid panel 2015.) Denies need for counseling or medication. [facility] information given instructed to schedule colonoscopy. Will check DEXA next year.   Huel Cote WHNP, 11:59 AM 11/05/2014

## 2014-11-06 LAB — COMPREHENSIVE METABOLIC PANEL
ALK PHOS: 53 U/L (ref 33–130)
ALT: 13 U/L (ref 6–29)
AST: 17 U/L (ref 10–35)
Albumin: 4 g/dL (ref 3.6–5.1)
BILIRUBIN TOTAL: 0.4 mg/dL (ref 0.2–1.2)
BUN: 12 mg/dL (ref 7–25)
CO2: 24 mEq/L (ref 20–31)
Calcium: 8.7 mg/dL (ref 8.6–10.4)
Chloride: 100 mEq/L (ref 98–110)
Creat: 0.73 mg/dL (ref 0.50–1.05)
GLUCOSE: 84 mg/dL (ref 65–99)
POTASSIUM: 3.6 meq/L (ref 3.5–5.3)
Sodium: 132 mEq/L — ABNORMAL LOW (ref 135–146)
Total Protein: 6.5 g/dL (ref 6.1–8.1)

## 2014-11-06 LAB — VITAMIN D 25 HYDROXY (VIT D DEFICIENCY, FRACTURES): Vit D, 25-Hydroxy: 45 ng/mL (ref 30–100)

## 2014-11-06 LAB — CYTOLOGY - PAP

## 2015-11-11 ENCOUNTER — Encounter: Payer: Self-pay | Admitting: Women's Health

## 2015-11-11 ENCOUNTER — Ambulatory Visit (INDEPENDENT_AMBULATORY_CARE_PROVIDER_SITE_OTHER): Payer: PRIVATE HEALTH INSURANCE | Admitting: Women's Health

## 2015-11-11 VITALS — BP 119/78 | Ht 67.0 in | Wt 108.0 lb

## 2015-11-11 DIAGNOSIS — Z1329 Encounter for screening for other suspected endocrine disorder: Secondary | ICD-10-CM

## 2015-11-11 DIAGNOSIS — Z13220 Encounter for screening for lipoid disorders: Secondary | ICD-10-CM

## 2015-11-11 DIAGNOSIS — M25539 Pain in unspecified wrist: Secondary | ICD-10-CM

## 2015-11-11 DIAGNOSIS — Z01419 Encounter for gynecological examination (general) (routine) without abnormal findings: Secondary | ICD-10-CM | POA: Diagnosis not present

## 2015-11-11 LAB — RHEUMATOID FACTOR: Rhuematoid fact SerPl-aCnc: 15 IU/mL — ABNORMAL HIGH (ref <=14)

## 2015-11-11 LAB — COMPREHENSIVE METABOLIC PANEL
ALT: 21 U/L (ref 6–29)
AST: 25 U/L (ref 10–35)
Albumin: 4.3 g/dL (ref 3.6–5.1)
Alkaline Phosphatase: 70 U/L (ref 33–130)
BUN: 13 mg/dL (ref 7–25)
CO2: 24 mmol/L (ref 20–31)
CREATININE: 0.91 mg/dL (ref 0.50–1.05)
Calcium: 9.4 mg/dL (ref 8.6–10.4)
Chloride: 101 mmol/L (ref 98–110)
GLUCOSE: 97 mg/dL (ref 65–99)
Potassium: 4.3 mmol/L (ref 3.5–5.3)
Sodium: 136 mmol/L (ref 135–146)
Total Bilirubin: 0.5 mg/dL (ref 0.2–1.2)
Total Protein: 7.4 g/dL (ref 6.1–8.1)

## 2015-11-11 LAB — CBC WITH DIFFERENTIAL/PLATELET
BASOS ABS: 54 {cells}/uL (ref 0–200)
Basophils Relative: 1 %
EOS ABS: 108 {cells}/uL (ref 15–500)
Eosinophils Relative: 2 %
HEMATOCRIT: 43.6 % (ref 35.0–45.0)
HEMOGLOBIN: 14.7 g/dL (ref 11.7–15.5)
LYMPHS PCT: 29 %
Lymphs Abs: 1566 cells/uL (ref 850–3900)
MCH: 29.6 pg (ref 27.0–33.0)
MCHC: 33.7 g/dL (ref 32.0–36.0)
MCV: 87.9 fL (ref 80.0–100.0)
MONO ABS: 540 {cells}/uL (ref 200–950)
MPV: 9.6 fL (ref 7.5–12.5)
Monocytes Relative: 10 %
NEUTROS PCT: 58 %
Neutro Abs: 3132 cells/uL (ref 1500–7800)
Platelets: 264 10*3/uL (ref 140–400)
RBC: 4.96 MIL/uL (ref 3.80–5.10)
RDW: 13.6 % (ref 11.0–15.0)
WBC: 5.4 10*3/uL (ref 3.8–10.8)

## 2015-11-11 LAB — LIPID PANEL
Cholesterol: 207 mg/dL — ABNORMAL HIGH (ref 125–200)
HDL: 104 mg/dL (ref >=46)
LDL CALC: 91 mg/dL (ref <130)
Total CHOL/HDL Ratio: 2 Ratio (ref <=5.0)
Triglycerides: 60 mg/dL (ref <150)
VLDL: 12 mg/dL (ref <30)

## 2015-11-11 LAB — TSH: TSH: 1.12 mIU/L

## 2015-11-11 NOTE — Progress Notes (Signed)
Lindsey Sanford 1960/08/21 [id number]    History:    Presents for annual exam.  Postmenopausal 2 years with no bleeding no HRT. Normal Pap and mammogram history. Has not had a colonoscopy. 2013 T dap. Vitamin D 45.  Past medical history, past surgical history, family history and social history were all reviewed and documented in the EPIC chart. 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 Mother healthy. Son living in [place] doing sustainable farming. Daughter in med school, other daughter in [place].   ROS:  A ROS was performed and pertinent positives and negatives are included.  Exam:  Vitals:   11/11/15 0934  BP: 119/78     General appearance:  Normal Thyroid:  Symmetrical, normal in size, without palpable masses or nodularity. Respiratory  Auscultation:  Clear without wheezing or rhonchi Cardiovascular  Auscultation:  Regular rate, without rubs, murmurs or gallops  Edema/varicosities:  Not grossly evident Abdominal  Soft,nontender, without masses, guarding or rebound.  Liver/spleen:  No organomegaly noted  Hernia:  None appreciated  Skin  Inspection:  Grossly normal   Breasts: Examined lying and sitting.     Right: Without masses, retractions, discharge or axillary adenopathy.     Left: Without masses, retractions, discharge or axillary adenopathy. Gentitourinary   Inguinal/mons:  Normal without inguinal adenopathy  External genitalia:  Normal  BUS/Urethra/Skene's glands:  Normal  Vagina:  Atrophic  Cervix:  Normal  Uterus:   normal in size, shape and contour.  Midline and mobile  Adnexa/parametria:     Rt: Without masses or tenderness.   Lt: Without masses or tenderness.  Anus and perineum: Normal  Digital rectal exam: Normal sphincter tone without palpated masses or tenderness  Assessment/Plan:  55 y.o. MWF G3 P3 for annual exam with complaint of joint discomfort with mild swelling occasionally in hands.  Postmenopausal/no bleeding/no  HRT Probable arthritis  Plan: CBC, CMP, lipid panel, TSH, rheumatoid factor, UA, Pap normal with negative HR HPV 2016 new screening guidelines reviewed. SBE's, overdue for mammogram instructed to schedule 3-D tomography history of dense breasts. Continue active lifestyle, calcium rich diet, vitamin D 1000 daily encouraged. History of a normal vitamin D level. Reviewed importance of screening colonoscopy, [facility] information given and reviewed instructed to schedule. Reviewed joint discomfort and hands and mild swelling occasionally is more likely related to arthritis, reviewed importance of drinking enough fluids preventing overuse of joints. Will check a DEXA next year.  Huel Cote [facility], 10:35 AM 11/11/2015

## 2015-11-11 NOTE — Patient Instructions (Signed)
Schedule mammogram  [id number]  Colonoscopy  Dr Celine Ahr  [phone]  Menopause is a normal process in which your reproductive ability comes to an end. This process happens gradually over a span of months to years, usually between the ages of 33 and 79. Menopause is complete when you have missed 12 consecutive menstrual periods. It is important to talk with your health care provider about some of the most common conditions that affect postmenopausal women, such as heart disease, cancer, and bone loss (osteoporosis). Adopting a healthy lifestyle and getting preventive care can help to promote your health and wellness. Those actions can also lower your chances of developing some of these common conditions. WHAT SHOULD I KNOW ABOUT MENOPAUSE? During menopause, you may experience a number of symptoms, such as:  Moderate-to-severe hot flashes.  Night sweats.  Decrease in sex drive.  Mood swings.  Headaches.  Tiredness.  Irritability.  Memory problems.  Insomnia. Choosing to treat or not to treat menopausal changes is an individual decision that you make with your health care provider. WHAT SHOULD I KNOW ABOUT HORMONE REPLACEMENT THERAPY AND SUPPLEMENTS? Hormone therapy products are effective for treating symptoms that are associated with menopause, such as hot flashes and night sweats. Hormone replacement carries certain risks, especially as you become older. If you are thinking about using estrogen or estrogen with progestin treatments, discuss the benefits and risks with your health care provider. WHAT SHOULD I KNOW ABOUT HEART DISEASE AND STROKE? Heart disease, heart attack, and stroke become more likely as you age. This may be due, in part, to the hormonal changes that your body experiences during menopause. These can affect how your body processes dietary fats, triglycerides, and cholesterol. Heart attack and stroke are both medical emergencies. There are many things that you can  do to help prevent heart disease and stroke:  Have your blood pressure checked at least every 1-2 years. High blood pressure causes heart disease and increases the risk of stroke.  If you are 59-60 years old, ask your health care provider if you should take aspirin to prevent a heart attack or a stroke.  Do not use any tobacco products, including cigarettes, chewing tobacco, or electronic cigarettes. If you need help quitting, ask your health care provider.  It is important to eat a healthy diet and maintain a healthy weight.  Be sure to include plenty of vegetables, fruits, low-fat dairy products, and lean protein.  Avoid eating foods that are high in solid fats, added sugars, or salt (sodium).  Get regular exercise. This is one of the most important things that you can do for your health.  Try to exercise for at least 150 minutes each week. The type of exercise that you do should increase your heart rate and make you sweat. This is known as moderate-intensity exercise.  Try to do strengthening exercises at least twice each week. Do these in addition to the moderate-intensity exercise.  Know your numbers.Ask your health care provider to check your cholesterol and your blood glucose. Continue to have your blood tested as directed by your health care provider. WHAT SHOULD I KNOW ABOUT CANCER SCREENING? There are several types of cancer. Take the following steps to reduce your risk and to catch any cancer development as early as possible. Breast Cancer  Practice breast self-awareness.  This means understanding how your breasts normally appear and feel.  It also means doing regular breast self-exams. Let your health care provider know about any changes, no matter how  small.  If you are 40 or older, have a clinician do a breast exam (clinical breast exam or CBE) every year. Depending on your age, family history, and medical history, it may be recommended that you also have a yearly breast  X-ray (mammogram).  If you have a family history of breast cancer, talk with your health care provider about genetic screening.  If you are at high risk for breast cancer, talk with your health care provider about having an MRI and a mammogram every year.  Breast cancer (BRCA) gene test is recommended for women who have family members with BRCA-related cancers. Results of the assessment will determine the need for genetic counseling and BRCA1 and for BRCA2 testing. BRCA-related cancers include these types:  Breast. This occurs in males or females.  Ovarian.  Tubal. This may also be called fallopian tube cancer.  Cancer of the abdominal or pelvic lining (peritoneal cancer).  Prostate.  Pancreatic. Cervical, Uterine, and Ovarian Cancer Your health care provider may recommend that you be screened regularly for cancer of the pelvic organs. These include your ovaries, uterus, and vagina. This screening involves a pelvic exam, which includes checking for microscopic changes to the surface of your cervix (Pap test).  For women ages 21-65, health care providers may recommend a pelvic exam and a Pap test every three years. For women ages 98-65, they may recommend the Pap test and pelvic exam, combined with testing for human papilloma virus (HPV), every five years. Some types of HPV increase your risk of cervical cancer. Testing for HPV may also be done on women of any age who have unclear Pap test results.  Other health care providers may not recommend any screening for nonpregnant women who are considered low risk for pelvic cancer and have no symptoms. Ask your health care provider if a screening pelvic exam is right for you.  If you have had past treatment for cervical cancer or a condition that could lead to cancer, you need Pap tests and screening for cancer for at least 20 years after your treatment. If Pap tests have been discontinued for you, your risk factors (such as having a new sexual  partner) need to be reassessed to determine if you should start having screenings again. Some women have medical problems that increase the chance of getting cervical cancer. In these cases, your health care provider may recommend that you have screening and Pap tests more often.  If you have a family history of uterine cancer or ovarian cancer, talk with your health care provider about genetic screening.  If you have vaginal bleeding after reaching menopause, tell your health care provider.  There are currently no reliable tests available to screen for ovarian cancer. Lung Cancer Lung cancer screening is recommended for adults 74-56 years old who are at high risk for lung cancer because of a history of smoking. A yearly low-dose CT scan of the lungs is recommended if you:  Currently smoke.  Have a history of at least 30 pack-years of smoking and you currently smoke or have quit within the past 15 years. A pack-year is smoking an average of one pack of cigarettes per day for one year. Yearly screening should:  Continue until it has been 15 years since you quit.  Stop if you develop a health problem that would prevent you from having lung cancer treatment. Colorectal Cancer  This type of cancer can be detected and can often be prevented.  Routine colorectal cancer screening usually  begins at age 50 and continues through age 3.  If you have risk factors for colon cancer, your health care provider may recommend that you be screened at an earlier age.  If you have a family history of colorectal cancer, talk with your health care provider about genetic screening.  Your health care provider may also recommend using home test kits to check for hidden blood in your stool.  A small camera at the end of a tube can be used to examine your colon directly (sigmoidoscopy or colonoscopy). This is done to check for the earliest forms of colorectal cancer.  Direct examination of the colon should be  repeated every 5-10 years until age 65. However, if early forms of precancerous polyps or small growths are found or if you have a family history or genetic risk for colorectal cancer, you may need to be screened more often. Skin Cancer  Check your skin from head to toe regularly.  Monitor any moles. Be sure to tell your health care provider:  About any new moles or changes in moles, especially if there is a change in a mole's shape or color.  If you have a mole that is larger than the size of a pencil eraser.  If any of your family members has a history of skin cancer, especially at a Elenie Coven age, talk with your health care provider about genetic screening.  Always use sunscreen. Apply sunscreen liberally and repeatedly throughout the day.  Whenever you are outside, protect yourself by wearing long sleeves, pants, a wide-brimmed hat, and sunglasses. WHAT SHOULD I KNOW ABOUT OSTEOPOROSIS? Osteoporosis is a condition in which bone destruction happens more quickly than new bone creation. After menopause, you may be at an increased risk for osteoporosis. To help prevent osteoporosis or the bone fractures that can happen because of osteoporosis, the following is recommended:  If you are 51-30 years old, get at least 1,000 mg of calcium and at least 600 mg of vitamin D per day.  If you are older than age 74 but younger than age 34, get at least 1,200 mg of calcium and at least 600 mg of vitamin D per day.  If you are older than age 30, get at least 1,200 mg of calcium and at least 800 mg of vitamin D per day. Smoking and excessive alcohol intake increase the risk of osteoporosis. Eat foods that are rich in calcium and vitamin D, and do weight-bearing exercises several times each week as directed by your health care provider. WHAT SHOULD I KNOW ABOUT HOW MENOPAUSE AFFECTS [place]? Depression may occur at any age, but it is more common as you become older. Common symptoms of depression  include:  Low or sad mood.  Changes in sleep patterns.  Changes in appetite or eating patterns.  Feeling an overall lack of motivation or enjoyment of activities that you previously enjoyed.  Frequent crying spells. Talk with your health care provider if you think that you are experiencing depression. WHAT SHOULD I KNOW ABOUT IMMUNIZATIONS? It is important that you get and maintain your immunizations. These include:  Tetanus, diphtheria, and pertussis (Tdap) booster vaccine.  Influenza every year before the flu season begins.  Pneumonia vaccine.  Shingles vaccine. Your health care provider may also recommend other immunizations.   This information is not intended to replace advice given to you by your health care provider. Make sure you discuss any questions you have with your health care provider.   Document Released:  05/20/2005 Document Revised: 04/18/2014 Document Reviewed: 11/28/2013 Elsevier Interactive Patient Education [employer].

## 2015-11-12 ENCOUNTER — Telehealth: Payer: Self-pay | Admitting: *Deleted

## 2015-11-12 NOTE — Telephone Encounter (Signed)
Notes faxed the doctor will review notes to approve referral, if approved they will fax me back with time and date.

## 2015-11-12 NOTE — Telephone Encounter (Signed)
Per nancy young, NP ". refer to rheumatologist Dr. Estanislado Pandy"

## 2015-11-19 NOTE — Telephone Encounter (Signed)
I called Dr.Deveshwar office and she did approve to see patient, the scheduler will call to schedule.

## 2015-11-30 NOTE — Telephone Encounter (Signed)
Appointment on 01/05/16 @ 8:45am with Dr.Deveshwar

## 2016-01-20 ENCOUNTER — Ambulatory Visit (INDEPENDENT_AMBULATORY_CARE_PROVIDER_SITE_OTHER): Payer: PRIVATE HEALTH INSURANCE | Admitting: Rheumatology

## 2016-01-20 DIAGNOSIS — M79641 Pain in right hand: Secondary | ICD-10-CM | POA: Diagnosis not present

## 2016-01-20 DIAGNOSIS — M79642 Pain in left hand: Secondary | ICD-10-CM | POA: Diagnosis not present

## 2016-02-22 DIAGNOSIS — M19042 Primary osteoarthritis, left hand: Principal | ICD-10-CM

## 2016-02-22 DIAGNOSIS — R5383 Other fatigue: Secondary | ICD-10-CM | POA: Insufficient documentation

## 2016-02-22 DIAGNOSIS — M19071 Primary osteoarthritis, right ankle and foot: Secondary | ICD-10-CM | POA: Insufficient documentation

## 2016-02-22 DIAGNOSIS — M19072 Primary osteoarthritis, left ankle and foot: Secondary | ICD-10-CM

## 2016-02-22 DIAGNOSIS — M19041 Primary osteoarthritis, right hand: Secondary | ICD-10-CM | POA: Insufficient documentation

## 2016-02-22 DIAGNOSIS — D1631 Benign neoplasm of short bones of right lower limb: Secondary | ICD-10-CM | POA: Insufficient documentation

## 2016-02-22 NOTE — Progress Notes (Signed)
Office Visit Note  Patient: Lindsey Sanford             Date of Birth: November 01, 1960           MRN: [id number]             PCP: Huel Cote, NP Referring: No ref. provider found Visit Date: 02/25/2016 Occupation: Event coordinator    Subjective:  Pain hands   History of Present Illness: Lindsey Sanford is a 55 y.o. right-handed female she continues to have some pain and stiffness in her hands. She also reports some discomfort in her feet. She denies any joint swelling. She states her fatigue is a still an issue. She has had no episodes of recent oral ulcers or photosensitivity.  Activities of Daily Living:  Patient reports morning stiffness for 5 minutes.   Patient Denies nocturnal pain.  Difficulty dressing/grooming: Denies Difficulty climbing stairs: Denies Difficulty getting out of chair: Denies Difficulty using hands for taps, buttons, cutlery, and/or writing: Denies   Review of Systems  Constitutional: Negative for fatigue, night sweats, weight gain, weight loss and weakness.  HENT: Negative for mouth sores, trouble swallowing, trouble swallowing, mouth dryness and nose dryness.   Eyes: Negative for pain, redness, visual disturbance and dryness.  Respiratory: Negative for cough, shortness of breath and difficulty breathing.   Cardiovascular: Negative for chest pain, palpitations, hypertension, irregular heartbeat and swelling in legs/feet.  Gastrointestinal: Negative for blood in stool, constipation and diarrhea.  Endocrine: Negative for increased urination.  Genitourinary: Negative for vaginal dryness.  Musculoskeletal: Negative for arthralgias, joint pain, joint swelling, myalgias, muscle weakness, morning stiffness, muscle tenderness and myalgias.  Skin: Negative for color change, rash, hair loss, skin tightness, ulcers and sensitivity to sunlight.  Allergic/Immunologic: Negative for susceptible to infections.  Neurological: Negative for dizziness, memory loss and  night sweats.  Hematological: Negative for swollen glands.  Psychiatric/Behavioral: Negative for depressed mood and sleep disturbance. The patient is not nervous/anxious.     PMFS History:  Patient Active Problem List   Diagnosis Date Noted  . Primary osteoarthritis of both hands 02/22/2016  . Primary osteoarthritis of both feet 02/22/2016  . Fatigue 02/22/2016  . Osteochondroma of foot, right 02/22/2016  . Anxiety 07/15/2011    Past Medical History:  Diagnosis Date  . Anxiety   . Depression   . Osteopenia     Family History  Problem Relation Age of Onset  . Diabetes Father   . Eczema Father   . Breast cancer Paternal Aunt   . Eczema Sister    Past Surgical History:  Procedure Laterality Date  . FOOT SURGERY    . HERNIA REPAIR    . TONSILLECTOMY     Social History   Social History Narrative  . No narrative on file     Objective: Vital Signs: BP 123/60 (BP Location: Left Arm, Patient Position: Sitting, Cuff Size: Large)   Pulse 70   Resp 12   Ht 5' 7.5" (1.715 m)   Wt 111 lb (50.3 kg)   LMP 09/11/2013   BMI 17.13 kg/m    Physical Exam  Constitutional: She is oriented to person, place, and time. She appears well-developed and well-nourished.  HENT:  Head: Normocephalic and atraumatic.  Eyes: Conjunctivae and EOM are normal.  Neck: Normal range of motion.  Cardiovascular: Normal rate, regular rhythm, normal heart sounds and intact distal pulses.   Pulmonary/Chest: Effort normal and breath sounds normal.  Abdominal: Soft. Bowel sounds are normal.  Lymphadenopathy:  She has no cervical adenopathy.  Neurological: She is alert and oriented to person, place, and time.  Skin: Skin is warm and dry. Capillary refill takes less than 2 seconds.  Psychiatric: She has a normal mood and affect. Her behavior is normal.  Nursing note and vitals reviewed.    Musculoskeletal Exam: C-spine, thoracic spine, lumbar spine good range of motion no SI joint tenderness.  Bilateral shoulder joints elbow joints, wrist joints, MCPs with good range of motion with no synovitis. She has some thickening of PIP/DIP joints in her hands. Hip joints, knee joints, ankle joints are good range of motion she is thickening of bilateral first MTP joints. With no synovitis.  CDAI Exam: No CDAI exam completed.    Investigation: Findings:  01/05/2016 ESR 4, CK 65, UA negative, ANA negative, CCP negative, 14 33 eta negative, 11/12/2015 RF15 normal<14    Imaging: No results found.  Speciality Comments: No specialty comments available.    Procedures:  No procedures performed Allergies: Nitrofurantoin monohyd macro   Assessment / Plan: Visit Diagnoses: Primary osteoarthritis of both hands - RF15,-CCP,-14-3-3n, ESRn,01/05/16 Hand xrays c/w OA,US -synovitis. She continues to have some stiffness in her hands and discomfort she has no findings of rheumatoid arthritis on clinical examination or ultrasound examination. Detailed counseling regarding osteoarthritis was provided and muscle strengthening exercises were demonstrated and discussed. Joint protection was also discussed.  Primary osteoarthritis of both feet: Proper fitting shoes were discussed.  Fatigue: Need for regular exercise was discussed.  Viral warts - On hands: Advised to see dermatologist.   Her other medical problems are listed as follows:  Osteochondroma of foot, right - Status post surgery 1985     Face-to-face time spent with patient was 30 minutes. 50% of time was spent in counseling and coordination of care.  Follow-Up Instructions: Return in about 1 year (around 02/24/2017) for Osteoarthritis.   Shaili Deveshwar, MD    

## 2016-02-24 ENCOUNTER — Encounter: Payer: Self-pay | Admitting: *Deleted

## 2016-02-25 ENCOUNTER — Encounter: Payer: Self-pay | Admitting: Rheumatology

## 2016-02-25 ENCOUNTER — Ambulatory Visit (INDEPENDENT_AMBULATORY_CARE_PROVIDER_SITE_OTHER): Payer: PRIVATE HEALTH INSURANCE | Admitting: Rheumatology

## 2016-02-25 VITALS — BP 123/60 | HR 70 | Resp 12 | Ht 67.5 in | Wt 111.0 lb

## 2016-02-25 DIAGNOSIS — M19072 Primary osteoarthritis, left ankle and foot: Secondary | ICD-10-CM | POA: Diagnosis not present

## 2016-02-25 DIAGNOSIS — B079 Viral wart, unspecified: Secondary | ICD-10-CM | POA: Diagnosis not present

## 2016-02-25 DIAGNOSIS — M19042 Primary osteoarthritis, left hand: Secondary | ICD-10-CM

## 2016-02-25 DIAGNOSIS — D1631 Benign neoplasm of short bones of right lower limb: Secondary | ICD-10-CM | POA: Diagnosis not present

## 2016-02-25 DIAGNOSIS — M19071 Primary osteoarthritis, right ankle and foot: Secondary | ICD-10-CM

## 2016-02-25 DIAGNOSIS — M19041 Primary osteoarthritis, right hand: Secondary | ICD-10-CM

## 2016-02-25 DIAGNOSIS — R5383 Other fatigue: Secondary | ICD-10-CM | POA: Diagnosis not present

## 2016-02-25 NOTE — Patient Instructions (Signed)
Supplements for OA Natural anti-inflammatories  You can purchase these at [employer], [employer] or online.  . Turmeric (capsules)  . Ginger (ginger root or capsules)  . Omega 3 (Fish, flax seeds, chia seeds, walnuts, almonds)  . Tart cherry (dried or extract)   Patient should be under the care of a physician while taking these supplements. This may not be reproduced without the permission of Dr. Chanita Boden.  

## 2016-08-24 ENCOUNTER — Encounter: Payer: Self-pay | Admitting: Gynecology

## 2016-10-07 ENCOUNTER — Encounter: Payer: Self-pay | Admitting: Gynecology

## 2016-10-07 ENCOUNTER — Ambulatory Visit (INDEPENDENT_AMBULATORY_CARE_PROVIDER_SITE_OTHER): Payer: PRIVATE HEALTH INSURANCE | Admitting: Gynecology

## 2016-10-07 VITALS — BP 128/70

## 2016-10-07 DIAGNOSIS — B9689 Other specified bacterial agents as the cause of diseases classified elsewhere: Secondary | ICD-10-CM

## 2016-10-07 DIAGNOSIS — N760 Acute vaginitis: Secondary | ICD-10-CM | POA: Diagnosis not present

## 2016-10-07 DIAGNOSIS — L292 Pruritus vulvae: Secondary | ICD-10-CM

## 2016-10-07 LAB — WET PREP FOR TRICH, YEAST, CLUE
TRICH WET PREP: NONE SEEN
Yeast Wet Prep HPF POC: NONE SEEN

## 2016-10-07 MED ORDER — TINIDAZOLE 500 MG PO TABS: ORAL_TABLET | ORAL | 0 refills | Status: DC

## 2016-10-07 MED ORDER — FLUCONAZOLE 150 MG PO TABS: 150.0000 mg | ORAL_TABLET | Freq: Once | ORAL | 0 refills | Status: DC

## 2016-10-07 NOTE — Progress Notes (Signed)
   Patient is a 56 year old that returned from a camping trip and was complaining of vulvar irritation and pruritus but no true discharge. She is in a monogamous relationship. She is on no hormone replacement therapy. Patient denies any fever, chills, nausea, vomiting or any dysuria or frequency or any back pain and no GI complaints.  Exam: The external genitalia was inspected with a magnification lens and no abnormality was noted. Bartholin urethra Skene was within normal limits Vagina: No lesions or discharge Cervix: No lesions or discharge Bimanual exam not done  Wet prep: Moderate clue cells 2 numerous to count white blood cell tumors to count bacteria  Assessment/plan: Clinical evidence of bacterial vaginosis will be treated with Tindamax 500 mg tablets. She will take 4 tablets today repeat in 24 hours. For the external pruritus she's can use Monistat 3 to apply external genitalia before bedtime for 3 days. For future prophylaxis of recommended she take a daily refresh probiotic tablet. Patient due for her annual exam in August of this year.

## 2016-10-07 NOTE — Patient Instructions (Addendum)
Bacterial Vaginosis Bacterial vaginosis is a vaginal infection that occurs when the normal balance of bacteria in the vagina is disrupted. It results from an overgrowth of certain bacteria. This is the most common vaginal infection among women ages 15-44. Because bacterial vaginosis increases your risk for STIs (sexually transmitted infections), getting treated can help reduce your risk for chlamydia, gonorrhea, herpes, and HIV (human immunodeficiency virus). Treatment is also important for preventing complications in pregnant women, because this condition can cause an early (premature) delivery. What are the causes? This condition is caused by an increase in harmful bacteria that are normally present in small amounts in the vagina. However, the reason that the condition develops is not fully understood. What increases the risk? The following factors may make you more likely to develop this condition:  Having a new sexual partner or multiple sexual partners.  Having unprotected sex.  Douching.  Having an intrauterine device (IUD).  Smoking.  Drug and alcohol abuse.  Taking certain antibiotic medicines.  Being pregnant.  You cannot get bacterial vaginosis from toilet seats, bedding, swimming pools, or contact with objects around you. What are the signs or symptoms? Symptoms of this condition include:  Grey or white vaginal discharge. The discharge can also be watery or foamy.  A fish-like odor with discharge, especially after sexual intercourse or during menstruation.  Itching in and around the vagina.  Burning or pain with urination.  Some women with bacterial vaginosis have no signs or symptoms. How is this diagnosed? This condition is diagnosed based on:  Your medical history.  A physical exam of the vagina.  Testing a sample of vaginal fluid under a microscope to look for a large amount of bad bacteria or abnormal cells. Your health care provider may use a cotton swab  or a small wooden spatula to collect the sample.  How is this treated? This condition is treated with antibiotics. These may be given as a pill, a vaginal cream, or a medicine that is put into the vagina (suppository). If the condition comes back after treatment, a second round of antibiotics may be needed. Follow these instructions at home: Medicines  Take over-the-counter and prescription medicines only as told by your health care provider.  Take or use your antibiotic as told by your health care provider. Do not stop taking or using the antibiotic even if you start to feel better. General instructions  If you have a female sexual partner, tell her that you have a vaginal infection. She should see her health care provider and be treated if she has symptoms. If you have a female sexual partner, he does not need treatment.  During treatment: ? Avoid sexual activity until you finish treatment. ? Do not douche. ? Avoid alcohol as directed by your health care provider. ? Avoid breastfeeding as directed by your health care provider.  Drink enough water and fluids to keep your urine clear or pale yellow.  Keep the area around your vagina and rectum clean. ? Wash the area daily with warm water. ? Wipe yourself from front to back after using the toilet.  Keep all follow-up visits as told by your health care provider. This is important. How is this prevented?  Do not douche.  Wash the outside of your vagina with warm water only.  Use protection when having sex. This includes latex condoms and dental dams.  Limit how many sexual partners you have. To help prevent bacterial vaginosis, it is best to have sex with just   one partner (monogamous).  Make sure you and your sexual partner are tested for STIs.  Wear cotton or cotton-lined underwear.  Avoid wearing tight pants and pantyhose, especially during summer.  Limit the amount of alcohol that you drink.  Do not use any products that  contain nicotine or tobacco, such as cigarettes and e-cigarettes. If you need help quitting, ask your health care provider.  Do not use illegal drugs. Where to find more information:  Centers for Disease Control and Prevention: AppraiserFraud.fi  American Sexual Health Association (ASHA): www.ashastd.org  [place] Department of Health and Financial controller, Office on Women's Health: DustingSprays.pl or SecuritiesCard.it Contact a health care provider if:  Your symptoms do not improve, even after treatment.  You have more discharge or pain when urinating.  You have a fever.  You have pain in your abdomen.  You have pain during sex.  You have vaginal bleeding between periods. Summary  Bacterial vaginosis is a vaginal infection that occurs when the normal balance of bacteria in the vagina is disrupted.  Because bacterial vaginosis increases your risk for STIs (sexually transmitted infections), getting treated can help reduce your risk for chlamydia, gonorrhea, herpes, and HIV (human immunodeficiency virus). Treatment is also important for preventing complications in pregnant women, because the condition can cause an early (premature) delivery.  This condition is treated with antibiotic medicines. These may be given as a pill, a vaginal cream, or a medicine that is put into the vagina (suppository). This information is not intended to replace advice given to you by your health care provider. Make sure you discuss any questions you have with your health care provider. Document Released: 03/28/2005 Document Revised: 12/12/2015 Document Reviewed: 12/12/2015 Elsevier Interactive Patient Education  2017 [employer]. Tinidazole tablets What is this medicine? TINIDAZOLE (tye NI da zole) is an antiinfective. It is used to treat amebiasis, giardiasis, trichomoniasis, and vaginosis. It will not work for colds, flu, or other viral infections. This medicine  may be used for other purposes; ask your health care provider or pharmacist if you have questions. COMMON BRAND NAME(S): Tindamax What should I tell my health care provider before I take this medicine? They need to know if you have any of these conditions: -anemia or other blood disorders -if you frequently drink alcohol containing drinks -receiving hemodialysis -seizure disorder -an unusual or allergic reaction to tinidazole, other medicines, foods, dyes, or preservatives -pregnant or trying to get pregnant -breast-feeding How should I use this medicine? Take this medicine by mouth with a full glass of water. Follow the directions on the prescription label. Take with food. Take your medicine at regular intervals. Do not take your medicine more often than directed. Take all of your medicine as directed even if you think you are better. Do not skip doses or stop your medicine early. Talk to your pediatrician regarding the use of this medicine in children. While this drug may be prescribed for children as young as 20 years of age for selected conditions, precautions do apply. Overdosage: If you think you have taken too much of this medicine contact a poison control center or emergency room at once. NOTE: This medicine is only for you. Do not share this medicine with others. What if I miss a dose? If you miss a dose, take it as soon as you can. If it is almost time for your next dose, take only that dose. Do not take double or extra doses. What may interact with this medicine? Do not take this  medicine with any of the following medications: -alcohol or any product that contains alcohol -amprenavir oral solution -disulfiram -paclitaxel injection -ritonavir oral solution -sertraline oral solution -sulfamethoxazole-trimethoprim injection This medicine may also interact with the following medications: -cholestyramine -cimetidine -conivaptan -cyclosporin -fluorouracil -fosphenytoin,  phenytoin -ketoconazole -lithium -phenobarbital -tacrolimus -warfarin This list may not describe all possible interactions. Give your health care provider a list of all the medicines, herbs, non-prescription drugs, or dietary supplements you use. Also tell them if you smoke, drink alcohol, or use illegal drugs. Some items may interact with your medicine. What should I watch for while using this medicine? Tell your doctor or health care professional if your symptoms do not improve or if they get worse. Avoid alcoholic drinks while you are taking this medicine and for three days afterward. Alcohol may make you feel dizzy, sick, or flushed. If you are being treated for a sexually transmitted disease, avoid sexual contact until you have finished your treatment. Your sexual partner may also need treatment. What side effects may I notice from receiving this medicine? Side effects that you should report to your doctor or health care professional as soon as possible: -allergic reactions like skin rash, itching or hives, swelling of the face, lips, or tongue -breathing problems -confusion, depression -dark or white patches in the mouth -feeling faint or lightheaded, falls -fever, infection -numbness, tingling, pain or weakness in the hands or feet -pain when passing urine -seizures -unusually weak or tired -vaginal irritation or discharge -vomiting Side effects that usually do not require medical attention (report to your doctor or health care professional if they continue or are bothersome): -dark brown or reddish urine -diarrhea -headache -loss of appetite -metallic taste -nausea -stomach upset This list may not describe all possible side effects. Call your doctor for medical advice about side effects. You may report side effects to FDA at 1-800-FDA-1088. Where should I keep my medicine? Keep out of the reach of children. Store at room temperature between 15 and 30 degrees C (59 and 86  degrees F). Protect from light and moisture. Keep container tightly closed. Throw away any unused medicine after the expiration date. NOTE: This sheet is a summary. It may not cover all possible information. If you have questions about this medicine, talk to your doctor, pharmacist, or health care provider.  2018 Elsevier/Gold Standard (2007-12-24 15:22:28)

## 2016-10-19 ENCOUNTER — Other Ambulatory Visit: Payer: Self-pay | Admitting: Women's Health

## 2016-10-19 DIAGNOSIS — Z1231 Encounter for screening mammogram for malignant neoplasm of breast: Secondary | ICD-10-CM

## 2016-10-28 ENCOUNTER — Ambulatory Visit
Admission: RE | Admit: 2016-10-28 | Discharge: 2016-10-28 | Disposition: A | Discharge status: Home / Self Care | Payer: PRIVATE HEALTH INSURANCE | Source: Ambulatory Visit | Attending: Women's Health | Admitting: Women's Health

## 2016-10-28 DIAGNOSIS — Z1231 Encounter for screening mammogram for malignant neoplasm of breast: Secondary | ICD-10-CM

## 2016-10-31 ENCOUNTER — Encounter: Payer: Self-pay | Admitting: Women's Health

## 2016-11-18 ENCOUNTER — Telehealth: Payer: Self-pay | Admitting: *Deleted

## 2016-11-18 NOTE — Telephone Encounter (Signed)
Pt called requesting to have Tdap vaccination, will be a new grandmother and was informed that she will need to have T-dap. Okay ?

## 2016-11-20 NOTE — Telephone Encounter (Signed)
WOW great news, have her come in for Tdap.  Yes everyone with close contact to the baby will need.  Grandfather too!

## 2016-11-21 NOTE — Telephone Encounter (Signed)
Pt informed, will call back to schedule nurse appointment

## 2016-11-25 ENCOUNTER — Ambulatory Visit (INDEPENDENT_AMBULATORY_CARE_PROVIDER_SITE_OTHER): Payer: PRIVATE HEALTH INSURANCE | Admitting: *Deleted

## 2016-11-25 DIAGNOSIS — Z23 Encounter for immunization: Secondary | ICD-10-CM | POA: Diagnosis not present

## 2017-01-02 ENCOUNTER — Encounter: Payer: PRIVATE HEALTH INSURANCE | Admitting: Women's Health

## 2017-01-10 ENCOUNTER — Encounter: Payer: Self-pay | Admitting: Women's Health

## 2017-01-10 ENCOUNTER — Ambulatory Visit (INDEPENDENT_AMBULATORY_CARE_PROVIDER_SITE_OTHER): Payer: PRIVATE HEALTH INSURANCE | Admitting: Women's Health

## 2017-01-10 VITALS — BP 148/80 | Ht 67.0 in | Wt 105.0 lb

## 2017-01-10 DIAGNOSIS — E559 Vitamin D deficiency, unspecified: Secondary | ICD-10-CM | POA: Diagnosis not present

## 2017-01-10 DIAGNOSIS — Z01419 Encounter for gynecological examination (general) (routine) without abnormal findings: Secondary | ICD-10-CM | POA: Diagnosis not present

## 2017-01-10 DIAGNOSIS — Z13820 Encounter for screening for osteoporosis: Secondary | ICD-10-CM | POA: Diagnosis not present

## 2017-01-10 DIAGNOSIS — Z13220 Encounter for screening for lipoid disorders: Secondary | ICD-10-CM | POA: Diagnosis not present

## 2017-01-10 NOTE — Progress Notes (Signed)
Lindsey Sanford 11-Nov-1960 [id number]    History:    Presents for annual exam.  Postmenopausal on no HRT with no bleeding. Normal Pap and mammogram history. 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 Has not had a screening colonoscopy. Feels very stressed with work, tearful most of exam.  Past medical history, past surgical history, family history and social history were all reviewed and documented in the EPIC chart. 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 One daughter and meds school, one daughter in [place], 1 son lives in [place]. Father diabetes, deceased, mother healthy.  ROS:  A ROS was performed and pertinent positives and negatives are included.  Exam:  Vitals:   01/10/17 0825  BP: (!) 148/80  Weight: 105 lb (47.6 kg)  Height: 5\' 7"  (1.702 m)   Body mass index is 16.45 kg/m.   General appearance:  Normal Thyroid:  Symmetrical, normal in size, without palpable masses or nodularity. Respiratory  Auscultation:  Clear without wheezing or rhonchi Cardiovascular  Auscultation:  Regular rate, without rubs, murmurs or gallops  Edema/varicosities:  Not grossly evident Abdominal  Soft,nontender, without masses, guarding or rebound.  Liver/spleen:  No organomegaly noted  Hernia:  None appreciated  Skin  Inspection:  Grossly normal   Breasts: Examined lying and sitting.     Right: Without masses, retractions, discharge or axillary adenopathy.     Left: Without masses, retractions, discharge or axillary adenopathy. Gentitourinary   Inguinal/mons:  Normal without inguinal adenopathy  External genitalia:  Normal  BUS/Urethra/Skene's glands:  Normal  Vagina:  Normal  Cervix:  Normal  Uterus:  normal in size, shape and contour.  Midline and mobile  Adnexa/parametria:     Rt: Without masses or tenderness.   Lt: Without masses or tenderness.  Anus and perineum: Normal  Digital rectal exam: Normal sphincter tone without palpated masses or tenderness  Assessment/Plan:  57 y.o. MWF G3  P3 for annual exam.     Postmenopausal/no HRT/no bleeding Elevated blood pressure Situational stress, anxiety, tearfulness Osteoarthritis-hands-rheumatologist  Plan: Strongly encouraged counseling, Sonda Primes name and number given. Reviewed importance of self-care, leisure activities. Denies any feelings of self-harm. SBE's, continue annual screening mammogram, calcium rich diet, vitamin D 2000 daily encouraged. DEXA. Reviewed importance of screening colonoscopy, [facility] information given instructed to schedule. Continue active lifestyle, cardia type exercise encouraged. Will check blood pressure call if continues greater than 130/80 follow-up with primary care for possible medication. Normal in the past. CBC, vitamin D, lipid panel, CMP. Pap normal with negative HR HPV 2016, new screening guidelines reviewed.      Huel Cote [facility], 8:31 AM 01/10/2017

## 2017-01-10 NOTE — Patient Instructions (Signed)
[facility]  [phone] Colonoscopy  Dr Leone Payor  [phone]  DASH Eating Plan DASH stands for "Dietary Approaches to Stop Hypertension." The DASH eating plan is a healthy eating plan that has been shown to reduce high blood pressure (hypertension). It may also reduce your risk for type 2 diabetes, heart disease, and stroke. The DASH eating plan may also help with weight loss. What are tips for following this plan? General guidelines  Avoid eating more than 2,300 mg (milligrams) of salt (sodium) a day. If you have hypertension, you may need to reduce your sodium intake to 1,500 mg a day.  Limit alcohol intake to no more than 1 drink a day for nonpregnant women and 2 drinks a day for men. One drink equals 12 oz of beer, 5 oz of wine, or 1 oz of hard liquor.  Work with your health care provider to maintain a healthy body weight or to lose weight. Ask what an ideal weight is for you.  Get at least 30 minutes of exercise that causes your heart to beat faster (aerobic exercise) most days of the week. Activities may include walking, swimming, or biking.  Work with your health care provider or diet and nutrition specialist (dietitian) to adjust your eating plan to your individual calorie needs. Reading food labels  Check food labels for the amount of sodium per serving. Choose foods with less than 5 percent of the Daily Value of sodium. Generally, foods with less than 300 mg of sodium per serving fit into this eating plan.  To find whole grains, look for the word "whole" as the first word in the ingredient list. Shopping  Buy products labeled as "low-sodium" or "no salt added."  Buy fresh foods. Avoid canned foods and premade or frozen meals. Cooking  Avoid adding salt when cooking. Use salt-free seasonings or herbs instead of table salt or sea salt. Check with your health care provider or pharmacist before using salt substitutes.  Do not fry foods. Cook foods using healthy methods such as  baking, boiling, grilling, and broiling instead.  Cook with heart-healthy oils, such as olive, canola, soybean, or sunflower oil. Meal planning   Eat a balanced diet that includes: ? 5 or more servings of fruits and vegetables each day. At each meal, try to fill half of your plate with fruits and vegetables. ? Up to 6-8 servings of whole grains each day. ? Less than 6 oz of lean meat, poultry, or fish each day. A 3-oz serving of meat is about the same size as a deck of cards. One egg equals 1 oz. ? 2 servings of low-fat dairy each day. ? A serving of nuts, seeds, or beans 5 times each week. ? Heart-healthy fats. Healthy fats called Omega-3 fatty acids are found in foods such as flaxseeds and coldwater fish, like sardines, salmon, and mackerel.  Limit how much you eat of the following: ? Canned or prepackaged foods. ? Food that is high in trans fat, such as fried foods. ? Food that is high in saturated fat, such as fatty meat. ? Sweets, desserts, sugary drinks, and other foods with added sugar. ? Full-fat dairy products.  Do not salt foods before eating.  Try to eat at least 2 vegetarian meals each week.  Eat more home-cooked food and less restaurant, buffet, and fast food.  When eating at a restaurant, ask that your food be prepared with less salt or no salt, if possible. What foods are recommended? The items listed may  not be a complete list. Talk with your dietitian about what dietary choices are best for you. Grains Whole-grain or whole-wheat bread. Whole-grain or whole-wheat pasta. Brown rice. Orpah Cobb. Bulgur. Whole-grain and low-sodium cereals. Pita bread. Low-fat, low-sodium crackers. Whole-wheat flour tortillas. Vegetables Fresh or frozen vegetables (raw, steamed, roasted, or grilled). Low-sodium or reduced-sodium tomato and vegetable juice. Low-sodium or reduced-sodium tomato sauce and tomato paste. Low-sodium or reduced-sodium canned vegetables. Fruits All fresh,  dried, or frozen fruit. Canned fruit in natural juice (without added sugar). Meat and other protein foods Skinless chicken or [place]. Ground chicken or [place]. Pork with fat trimmed off. Fish and seafood. Egg whites. Dried beans, peas, or lentils. Unsalted nuts, nut butters, and seeds. Unsalted canned beans. Lean cuts of beef with fat trimmed off. Low-sodium, lean deli meat. Dairy Low-fat (1%) or fat-free (skim) milk. Fat-free, low-fat, or reduced-fat cheeses. Nonfat, low-sodium ricotta or cottage cheese. Low-fat or nonfat yogurt. Low-fat, low-sodium cheese. Fats and oils Soft margarine without trans fats. Vegetable oil. Low-fat, reduced-fat, or light mayonnaise and salad dressings (reduced-sodium). Canola, safflower, olive, soybean, and sunflower oils. Avocado. Seasoning and other foods Herbs. Spices. Seasoning mixes without salt. Unsalted popcorn and pretzels. Fat-free sweets. What foods are not recommended? The items listed may not be a complete list. Talk with your dietitian about what dietary choices are best for you. Grains Baked goods made with fat, such as croissants, muffins, or some breads. Dry pasta or rice meal packs. Vegetables Creamed or fried vegetables. Vegetables in a cheese sauce. Regular canned vegetables (not low-sodium or reduced-sodium). Regular canned tomato sauce and paste (not low-sodium or reduced-sodium). Regular tomato and vegetable juice (not low-sodium or reduced-sodium). Rosita Fire. Olives. Fruits Canned fruit in a light or heavy syrup. Fried fruit. Fruit in cream or butter sauce. Meat and other protein foods Fatty cuts of meat. Ribs. Fried meat. Tomasa Blase. Sausage. Bologna and other processed lunch meats. Salami. Fatback. Hotdogs. Bratwurst. Salted nuts and seeds. Canned beans with added salt. Canned or smoked fish. Whole eggs or egg yolks. Chicken or [place] with skin. Dairy Whole or 2% milk, cream, and half-and-half. Whole or full-fat cream cheese. Whole-fat or sweetened  yogurt. Full-fat cheese. Nondairy creamers. Whipped toppings. Processed cheese and cheese spreads. Fats and oils Butter. Stick margarine. Lard. Shortening. Ghee. Bacon fat. Tropical oils, such as coconut, palm kernel, or palm oil. Seasoning and other foods Salted popcorn and pretzels. Onion salt, garlic salt, seasoned salt, table salt, and sea salt. Worcestershire sauce. Tartar sauce. Barbecue sauce. Teriyaki sauce. Soy sauce, including reduced-sodium. Steak sauce. Canned and packaged gravies. Fish sauce. Oyster sauce. Cocktail sauce. Horseradish that you find on the shelf. Ketchup. Mustard. Meat flavorings and tenderizers. Bouillon cubes. Hot sauce and Tabasco sauce. Premade or packaged marinades. Premade or packaged taco seasonings. Relishes. Regular salad dressings. Where to find more information:  National Heart, Lung, and Blood Institute: PopSteam.is  American Heart Association: www.heart.org Summary  The DASH eating plan is a healthy eating plan that has been shown to reduce high blood pressure (hypertension). It may also reduce your risk for type 2 diabetes, heart disease, and stroke.  With the DASH eating plan, you should limit salt (sodium) intake to 2,300 mg a day. If you have hypertension, you may need to reduce your sodium intake to 1,500 mg a day.  When on the DASH eating plan, aim to eat more fresh fruits and vegetables, whole grains, lean proteins, low-fat dairy, and heart-healthy fats.  Work with your health care provider or diet and  nutrition specialist (dietitian) to adjust your eating plan to your individual calorie needs. This information is not intended to replace advice given to you by your health care provider. Make sure you discuss any questions you have with your health care provider. Document Released: 03/17/2011 Document Revised: 03/21/2016 Document Reviewed: 03/21/2016 Elsevier Interactive Patient Education  2017 [employer]  Health Maintenance for  Postmenopausal Women Menopause is a normal process in which your reproductive ability comes to an end. This process happens gradually over a span of months to years, usually between the ages of 28 and 71. Menopause is complete when you have missed 12 consecutive menstrual periods. It is important to talk with your health care provider about some of the most common conditions that affect postmenopausal women, such as heart disease, cancer, and bone loss (osteoporosis). Adopting a healthy lifestyle and getting preventive care can help to promote your health and wellness. Those actions can also lower your chances of developing some of these common conditions. What should I know about menopause? During menopause, you may experience a number of symptoms, such as:  Moderate-to-severe hot flashes.  Night sweats.  Decrease in sex drive.  Mood swings.  Headaches.  Tiredness.  Irritability.  Memory problems.  Insomnia.  Choosing to treat or not to treat menopausal changes is an individual decision that you make with your health care provider. What should I know about hormone replacement therapy and supplements? Hormone therapy products are effective for treating symptoms that are associated with menopause, such as hot flashes and night sweats. Hormone replacement carries certain risks, especially as you become older. If you are thinking about using estrogen or estrogen with progestin treatments, discuss the benefits and risks with your health care provider. What should I know about heart disease and stroke? Heart disease, heart attack, and stroke become more likely as you age. This may be due, in part, to the hormonal changes that your body experiences during menopause. These can affect how your body processes dietary fats, triglycerides, and cholesterol. Heart attack and stroke are both medical emergencies. There are many things that you can do to help prevent heart disease and stroke:  Have  your blood pressure checked at least every 1-2 years. High blood pressure causes heart disease and increases the risk of stroke.  If you are 74-23 years old, ask your health care provider if you should take aspirin to prevent a heart attack or a stroke.  Do not use any tobacco products, including cigarettes, chewing tobacco, or electronic cigarettes. If you need help quitting, ask your health care provider.  It is important to eat a healthy diet and maintain a healthy weight. ? Be sure to include plenty of vegetables, fruits, low-fat dairy products, and lean protein. ? Avoid eating foods that are high in solid fats, added sugars, or salt (sodium).  Get regular exercise. This is one of the most important things that you can do for your health. ? Try to exercise for at least 150 minutes each week. The type of exercise that you do should increase your heart rate and make you sweat. This is known as moderate-intensity exercise. ? Try to do strengthening exercises at least twice each week. Do these in addition to the moderate-intensity exercise.  Know your numbers.Ask your health care provider to check your cholesterol and your blood glucose. Continue to have your blood tested as directed by your health care provider.  What should I know about cancer screening? There are several types of cancer.  Take the following steps to reduce your risk and to catch any cancer development as early as possible. Breast Cancer  Practice breast self-awareness. ? This means understanding how your breasts normally appear and feel. ? It also means doing regular breast self-exams. Let your health care provider know about any changes, no matter how small.  If you are 40 or older, have a clinician do a breast exam (clinical breast exam or CBE) every year. Depending on your age, family history, and medical history, it may be recommended that you also have a yearly breast X-ray (mammogram).  If you have a family history  of breast cancer, talk with your health care provider about genetic screening.  If you are at high risk for breast cancer, talk with your health care provider about having an MRI and a mammogram every year.  Breast cancer (BRCA) gene test is recommended for women who have family members with BRCA-related cancers. Results of the assessment will determine the need for genetic counseling and BRCA1 and for BRCA2 testing. BRCA-related cancers include these types: ? Breast. This occurs in males or females. ? Ovarian. ? Tubal. This may also be called fallopian tube cancer. ? Cancer of the abdominal or pelvic lining (peritoneal cancer). ? Prostate. ? Pancreatic.  Cervical, Uterine, and Ovarian Cancer Your health care provider may recommend that you be screened regularly for cancer of the pelvic organs. These include your ovaries, uterus, and vagina. This screening involves a pelvic exam, which includes checking for microscopic changes to the surface of your cervix (Pap test).  For women ages 21-65, health care providers may recommend a pelvic exam and a Pap test every three years. For women ages 51-65, they may recommend the Pap test and pelvic exam, combined with testing for human papilloma virus (HPV), every five years. Some types of HPV increase your risk of cervical cancer. Testing for HPV may also be done on women of any age who have unclear Pap test results.  Other health care providers may not recommend any screening for nonpregnant women who are considered low risk for pelvic cancer and have no symptoms. Ask your health care provider if a screening pelvic exam is right for you.  If you have had past treatment for cervical cancer or a condition that could lead to cancer, you need Pap tests and screening for cancer for at least 20 years after your treatment. If Pap tests have been discontinued for you, your risk factors (such as having a new sexual partner) need to be reassessed to determine if you  should start having screenings again. Some women have medical problems that increase the chance of getting cervical cancer. In these cases, your health care provider may recommend that you have screening and Pap tests more often.  If you have a family history of uterine cancer or ovarian cancer, talk with your health care provider about genetic screening.  If you have vaginal bleeding after reaching menopause, tell your health care provider.  There are currently no reliable tests available to screen for ovarian cancer.  Lung Cancer Lung cancer screening is recommended for adults 40-76 years old who are at high risk for lung cancer because of a history of smoking. A yearly low-dose CT scan of the lungs is recommended if you:  Currently smoke.  Have a history of at least 30 pack-years of smoking and you currently smoke or have quit within the past 15 years. A pack-year is smoking an average of one pack of cigarettes  per day for one year.  Yearly screening should:  Continue until it has been 15 years since you quit.  Stop if you develop a health problem that would prevent you from having lung cancer treatment.  Colorectal Cancer  This type of cancer can be detected and can often be prevented.  Routine colorectal cancer screening usually begins at age 61 and continues through age 59.  If you have risk factors for colon cancer, your health care provider may recommend that you be screened at an earlier age.  If you have a family history of colorectal cancer, talk with your health care provider about genetic screening.  Your health care provider may also recommend using home test kits to check for hidden blood in your stool.  A small camera at the end of a tube can be used to examine your colon directly (sigmoidoscopy or colonoscopy). This is done to check for the earliest forms of colorectal cancer.  Direct examination of the colon should be repeated every 5-10 years until age 17.  However, if early forms of precancerous polyps or small growths are found or if you have a family history or genetic risk for colorectal cancer, you may need to be screened more often.  Skin Cancer  Check your skin from head to toe regularly.  Monitor any moles. Be sure to tell your health care provider: ? About any new moles or changes in moles, especially if there is a change in a mole's shape or color. ? If you have a mole that is larger than the size of a pencil eraser.  If any of your family members has a history of skin cancer, especially at a young age, talk with your health care provider about genetic screening.  Always use sunscreen. Apply sunscreen liberally and repeatedly throughout the day.  Whenever you are outside, protect yourself by wearing long sleeves, pants, a wide-brimmed hat, and sunglasses.  What should I know about osteoporosis? Osteoporosis is a condition in which bone destruction happens more quickly than new bone creation. After menopause, you may be at an increased risk for osteoporosis. To help prevent osteoporosis or the bone fractures that can happen because of osteoporosis, the following is recommended:  If you are 31-67 years old, get at least 1,000 mg of calcium and at least 600 mg of vitamin D per day.  If you are older than age 29 but younger than age 26, get at least 1,200 mg of calcium and at least 600 mg of vitamin D per day.  If you are older than age 44, get at least 1,200 mg of calcium and at least 800 mg of vitamin D per day.  Smoking and excessive alcohol intake increase the risk of osteoporosis. Eat foods that are rich in calcium and vitamin D, and do weight-bearing exercises several times each week as directed by your health care provider. What should I know about how menopause affects my mental health? Depression may occur at any age, but it is more common as you become older. Common symptoms of depression include:  Low or sad  mood.  Changes in sleep patterns.  Changes in appetite or eating patterns.  Feeling an overall lack of motivation or enjoyment of activities that you previously enjoyed.  Frequent crying spells.  Talk with your health care provider if you think that you are experiencing depression. What should I know about immunizations? It is important that you get and maintain your immunizations. These include:  Tetanus, diphtheria, and  pertussis (Tdap) booster vaccine.  Influenza every year before the flu season begins.  Pneumonia vaccine.  Shingles vaccine.  Your health care provider may also recommend other immunizations. This information is not intended to replace advice given to you by your health care provider. Make sure you discuss any questions you have with your health care provider. Document Released: 05/20/2005 Document Revised: 10/16/2015 Document Reviewed: 12/30/2014 Elsevier Interactive Patient Education  2018 [employer].

## 2017-01-11 LAB — NO CULTURE INDICATED

## 2017-01-11 LAB — COMPREHENSIVE METABOLIC PANEL
AG RATIO: 1.5 (calc) (ref 1.0–2.5)
ALKALINE PHOSPHATASE (APISO): 66 U/L (ref 33–130)
ALT: 14 U/L (ref 6–29)
AST: 18 U/L (ref 10–35)
Albumin: 4.2 g/dL (ref 3.6–5.1)
BUN: 13 mg/dL (ref 7–25)
CALCIUM: 9.4 mg/dL (ref 8.6–10.4)
CHLORIDE: 102 mmol/L (ref 98–110)
CO2: 24 mmol/L (ref 20–32)
Creat: 0.83 mg/dL (ref 0.50–1.05)
GLOBULIN: 2.8 g/dL (ref 1.9–3.7)
Glucose, Bld: 110 mg/dL — ABNORMAL HIGH (ref 65–99)
Potassium: 3.9 mmol/L (ref 3.5–5.3)
Sodium: 136 mmol/L (ref 135–146)
Total Bilirubin: 0.4 mg/dL (ref 0.2–1.2)
Total Protein: 7 g/dL (ref 6.1–8.1)

## 2017-01-11 LAB — LIPID PANEL
CHOLESTEROL: 206 mg/dL — AB (ref <200)
HDL: 95 mg/dL (ref >50)
LDL Cholesterol (Calc): 95 mg/dL (calc)
Non-HDL Cholesterol (Calc): 111 mg/dL (calc) (ref <130)
Total CHOL/HDL Ratio: 2.2 (calc) (ref <5.0)
Triglycerides: 71 mg/dL (ref <150)

## 2017-01-11 LAB — CBC WITH DIFFERENTIAL/PLATELET
BASOS PCT: 1.1 %
Basophils Absolute: 57 cells/uL (ref 0–200)
EOS ABS: 130 {cells}/uL (ref 15–500)
Eosinophils Relative: 2.5 %
HEMATOCRIT: 40.5 % (ref 35.0–45.0)
Hemoglobin: 14 g/dL (ref 11.7–15.5)
LYMPHS ABS: 1622 {cells}/uL (ref 850–3900)
MCH: 29.5 pg (ref 27.0–33.0)
MCHC: 34.6 g/dL (ref 32.0–36.0)
MCV: 85.4 fL (ref 80.0–100.0)
MPV: 9.6 fL (ref 7.5–12.5)
Monocytes Relative: 9 %
Neutro Abs: 2922 cells/uL (ref 1500–7800)
Neutrophils Relative %: 56.2 %
Platelets: 276 10*3/uL (ref 140–400)
RBC: 4.74 10*6/uL (ref 3.80–5.10)
RDW: 12.8 % (ref 11.0–15.0)
TOTAL LYMPHOCYTE: 31.2 %
WBC: 5.2 10*3/uL (ref 3.8–10.8)
WBCMIX: 468 {cells}/uL (ref 200–950)

## 2017-01-11 LAB — URINALYSIS W MICROSCOPIC + REFLEX CULTURE
BILIRUBIN URINE: NEGATIVE
Bacteria, UA: NONE SEEN /HPF
Glucose, UA: NEGATIVE
HYALINE CAST: NONE SEEN /LPF
Hgb urine dipstick: NEGATIVE
Ketones, ur: NEGATIVE
Leukocyte Esterase: NEGATIVE
Nitrites, Initial: NEGATIVE
PROTEIN: NEGATIVE
RBC / HPF: NONE SEEN /HPF (ref 0–2)
SPECIFIC GRAVITY, URINE: 1.016 (ref 1.001–1.03)
SQUAMOUS EPITHELIAL / LPF: NONE SEEN /HPF (ref <=5)
WBC, UA: NONE SEEN /HPF (ref 0–5)
pH: 7 (ref 5.0–8.0)

## 2017-01-11 LAB — VITAMIN D 25 HYDROXY (VIT D DEFICIENCY, FRACTURES): VIT D 25 HYDROXY: 45 ng/mL (ref 30–100)

## 2017-02-24 ENCOUNTER — Ambulatory Visit: Payer: PRIVATE HEALTH INSURANCE | Admitting: Rheumatology

## 2017-03-21 ENCOUNTER — Ambulatory Visit: Payer: PRIVATE HEALTH INSURANCE | Admitting: Rheumatology

## 2017-05-10 ENCOUNTER — Ambulatory Visit (INDEPENDENT_AMBULATORY_CARE_PROVIDER_SITE_OTHER): Payer: PRIVATE HEALTH INSURANCE | Admitting: Licensed Clinical Social Worker

## 2017-05-10 DIAGNOSIS — F419 Anxiety disorder, unspecified: Secondary | ICD-10-CM

## 2017-06-27 ENCOUNTER — Ambulatory Visit: Payer: PRIVATE HEALTH INSURANCE | Admitting: Licensed Clinical Social Worker

## 2017-06-27 DIAGNOSIS — F419 Anxiety disorder, unspecified: Secondary | ICD-10-CM

## 2017-07-24 ENCOUNTER — Ambulatory Visit (INDEPENDENT_AMBULATORY_CARE_PROVIDER_SITE_OTHER): Payer: PRIVATE HEALTH INSURANCE | Admitting: Licensed Clinical Social Worker

## 2017-07-24 DIAGNOSIS — F419 Anxiety disorder, unspecified: Secondary | ICD-10-CM | POA: Diagnosis not present

## 2017-08-22 ENCOUNTER — Ambulatory Visit (INDEPENDENT_AMBULATORY_CARE_PROVIDER_SITE_OTHER): Payer: PRIVATE HEALTH INSURANCE | Admitting: Licensed Clinical Social Worker

## 2017-08-22 DIAGNOSIS — F419 Anxiety disorder, unspecified: Secondary | ICD-10-CM | POA: Diagnosis not present

## 2017-10-09 ENCOUNTER — Ambulatory Visit (INDEPENDENT_AMBULATORY_CARE_PROVIDER_SITE_OTHER): Payer: PRIVATE HEALTH INSURANCE | Admitting: Licensed Clinical Social Worker

## 2017-10-09 DIAGNOSIS — F419 Anxiety disorder, unspecified: Secondary | ICD-10-CM | POA: Diagnosis not present

## 2017-11-01 ENCOUNTER — Encounter: Payer: Self-pay | Admitting: Gastroenterology

## 2017-11-01 ENCOUNTER — Other Ambulatory Visit: Payer: Self-pay | Admitting: Women's Health

## 2017-11-01 DIAGNOSIS — Z1231 Encounter for screening mammogram for malignant neoplasm of breast: Secondary | ICD-10-CM

## 2017-11-23 ENCOUNTER — Ambulatory Visit
Admission: RE | Admit: 2017-11-23 | Discharge: 2017-11-23 | Disposition: A | Discharge status: Home / Self Care | Payer: PRIVATE HEALTH INSURANCE | Source: Ambulatory Visit | Attending: Women's Health | Admitting: Women's Health

## 2017-11-23 DIAGNOSIS — Z1231 Encounter for screening mammogram for malignant neoplasm of breast: Secondary | ICD-10-CM

## 2017-12-06 ENCOUNTER — Ambulatory Visit (AMBULATORY_SURGERY_CENTER): Payer: Self-pay

## 2017-12-06 VITALS — Ht 67.5 in | Wt 105.6 lb

## 2017-12-06 DIAGNOSIS — Z1211 Encounter for screening for malignant neoplasm of colon: Secondary | ICD-10-CM

## 2017-12-06 MED ORDER — NA SULFATE-K SULFATE-MG SULF 17.5-3.13-1.6 GM/177ML PO SOLN: 1.0000 | Freq: Once | ORAL | 0 refills | Status: AC

## 2017-12-06 NOTE — Progress Notes (Signed)
Per pt, no allergies to soy or egg products.Pt not taking any weight loss meds or using  O2 at home.  Pt refused emmi video. 

## 2017-12-12 ENCOUNTER — Encounter: Payer: Self-pay | Admitting: Gastroenterology

## 2017-12-13 ENCOUNTER — Telehealth: Payer: Self-pay | Admitting: Gastroenterology

## 2017-12-13 NOTE — Telephone Encounter (Signed)
Called patient, notified her that it is okay to take the antibiotic Doxycycline.

## 2017-12-18 ENCOUNTER — Ambulatory Visit: Payer: PRIVATE HEALTH INSURANCE | Admitting: Licensed Clinical Social Worker

## 2017-12-26 ENCOUNTER — Ambulatory Visit (AMBULATORY_SURGERY_CENTER): Payer: PRIVATE HEALTH INSURANCE | Admitting: Gastroenterology

## 2017-12-26 ENCOUNTER — Encounter: Payer: Self-pay | Admitting: Gastroenterology

## 2017-12-26 VITALS — BP 132/76 | HR 68 | Temp 97.7°F | Resp 16 | Ht 67.0 in | Wt 105.0 lb

## 2017-12-26 DIAGNOSIS — D122 Benign neoplasm of ascending colon: Secondary | ICD-10-CM | POA: Diagnosis not present

## 2017-12-26 DIAGNOSIS — Z1211 Encounter for screening for malignant neoplasm of colon: Secondary | ICD-10-CM

## 2017-12-26 MED ORDER — SODIUM CHLORIDE 0.9 % IV SOLN
500.0000 mL | Freq: Once | INTRAVENOUS | Status: DC
Start: 2017-12-26 — End: 2017-12-26

## 2017-12-26 NOTE — Op Note (Signed)
[place] Patient Name: Lindsey Sanford Procedure Date: 12/26/2017 8:51 AM MRN: [id number] Endoscopist: Justice Britain , MD Age: 57 Referring MD:  Date of Birth: 22-Jun-1960 Gender: Female Account #: [id number] Procedure:                Colonoscopy Indications:              Screening for colorectal malignant neoplasm, This                            is the patient's first colonoscopy Medicines:                Monitored Anesthesia Care Procedure:                Pre-Anesthesia Assessment:                           - Prior to the procedure, a History and Physical                            was performed, and patient medications and                            allergies were reviewed. The patient's tolerance of                            previous anesthesia was also reviewed. The risks                            and benefits of the procedure and the sedation                            options and risks were discussed with the patient.                            All questions were answered, and informed consent                            was obtained. Prior Anticoagulants: The patient has                            taken no previous anticoagulant or antiplatelet                            agents. ASA Grade Assessment: I - A normal, healthy                            patient. After reviewing the risks and benefits,                            the patient was deemed in satisfactory condition to                            undergo the procedure.  After obtaining informed consent, the colonoscope                            was passed under direct vision. Throughout the                            procedure, the patient's blood pressure, pulse, and                            oxygen saturations were monitored continuously. The                            Colonoscope was introduced through the anus and                            advanced to the 5 cm into the  ileum. The                            colonoscopy was performed without difficulty. The                            patient tolerated the procedure well. The quality                            of the bowel preparation was evaluated using the                            BBPS [facility] Bowel Preparation Scale) with scores                            of: Right Colon = 3 (entire mucosa seen well with                            no residual staining, small fragments of stool or                            opaque liquid), Transverse Colon = 2 (minor amount                            of residual staining, small fragments of stool                            and/or opaque liquid, but mucosa seen well) and                            Left Colon = 2 (minor amount of residual staining,                            small fragments of stool and/or opaque liquid, but                            mucosa seen well). The total BBPS score equals 7.  The quality of the bowel preparation was fair. Scope In: 9:03:17 AM Scope Out: 9:27:57 AM Scope Withdrawal Time: 0 hours 16 minutes 33 seconds  Total Procedure Duration: 0 hours 24 minutes 40 seconds  Findings:                 Hemorrhoids were found on perianal exam.                           The terminal ileum and ileocecal valve appeared                            normal.                           A single medium-sized angioectasia with typical                            arborization was found in the cecum.                           Two sessile polyps were found in the ascending                            colon. The polyps were 1 to 2 mm in size. These                            polyps were removed with a jumbo cold forceps.                            Resection and retrieval were complete.                           On multiple of the haustral folds, a mucosal                            vascular pattern change was noted segmentally. Due                             to the possibility of preparation artifact vs                            sessile serrated lesion evaluation, decision was                            made to biopsy with a cold forceps for histology                            purposes. Area was tattooed with an injection of                            Spot (carbon black) laterally to demarcate the                            larger lesion that was sampled.Marland Kitchen  A few small-mouthed diverticula were found in the                            sigmoid colon and transverse colon.                           The recto-sigmoid colon and sigmoid colon were                            grossly tortuous. Advancing the scope required                            changing the patient's position and using manual                            pressure.                           Non-bleeding non-thrombosed external and internal                            hemorrhoids were found during perianal exam, during                            digital exam and during endoscopy. The hemorrhoids                            were Grade I (internal hemorrhoids that do not                            prolapse). 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 Complications:            No immediate complications. Estimated blood loss:                            Minimal. Estimated Blood Loss:     Estimated blood loss was minimal. Impression:               - Preparation of the colon was fair.                           - Hemorrhoids found on perianal exam.                           - The examined portion of the ileum was normal.                           - A single medium-sized angioectasia with typical                            arborization was found in the cecum.                           -  Two 1 to 2 mm polyps in the ascending colon,                            removed with a jumbo cold forceps. Resected and                             retrieved.                           - Areas of mucosal vascular pattern variance in the                            ascending colon were noted on haustral folds.                            Biopsied to rule out sessile serrated adenoma.                            Tattooed adjacent.                           - Diverticulosis in the sigmoid colon and in the                            transverse colon.                           - Tortuous colon.                           - Non-bleeding non-thrombosed external and internal                            hemorrhoids. Recommendation:           - The patient will be observed post-procedure,                            until all discharge criteria are met.                           - Discharge patient to home.                           - Patient has a contact number available for                            emergencies. The signs and symptoms of potential                            delayed complications were discussed with the                            patient. Return to normal activities tomorrow.  Written discharge instructions were provided to the                            patient.                           - Resume previous diet.                           - Continue present medications.                           - Await pathology results. If adenomatous tissue                            found on first 2 polyps then would require a 5-year                            follow up. If sessile serrated polyp tissue is                            found then would require discussion and follow up                            earlier colonoscopy attempt as lesions are greater                            than 20-25 mm in size each. If no adenomatous                            tissue, would recommend 5-year follow up due to                            fair preparation either way.                           - Repeat  colonoscopy for surveillance based on                            pathology results.                           - The findings and recommendations were discussed                            with the patient.                           - The findings and recommendations were discussed                            with the patient's family. Justice Britain, MD 12/26/2017 10:25:13 AM

## 2017-12-26 NOTE — Patient Instructions (Addendum)
YOU HAD AN ENDOSCOPIC PROCEDURE TODAY AT [facility]:   Refer to the procedure report that was given to you for any specific questions about what was found during the examination.  If the procedure report does not answer your questions, please call your gastroenterologist to clarify.  If you requested that your care partner not be given the details of your procedure findings, then the procedure report has been included in a sealed envelope for you to review at your convenience later.  YOU SHOULD EXPECT: Some feelings of bloating in the abdomen. Passage of more gas than usual.  Walking can help get rid of the air that was put into your GI tract during the procedure and reduce the bloating. If you had a lower endoscopy (such as a colonoscopy or flexible sigmoidoscopy) you may notice spotting of blood in your stool or on the toilet paper. If you underwent a bowel prep for your procedure, you may not have a normal bowel movement for a few days.  Please Note:  You might notice some irritation and congestion in your nose or some drainage.  This is from the oxygen used during your procedure.  There is no need for concern and it should clear up in a day or so.  SYMPTOMS TO REPORT IMMEDIATELY:   Following lower endoscopy (colonoscopy or flexible sigmoidoscopy):  Excessive amounts of blood in the stool  Significant tenderness or worsening of abdominal pains  Swelling of the abdomen that is new, acute  Fever of 100F or higher  For urgent or emergent issues, a gastroenterologist can be reached at any hour by calling [phone].   DIET:  We do recommend a small meal at first, but then you may proceed to your regular diet.  Drink plenty of fluids but you should avoid alcoholic beverages for 24 hours.  ACTIVITY:  You should plan to take it easy for the rest of today and you should NOT DRIVE or use heavy machinery until tomorrow (because of the sedation medicines used during the test).     FOLLOW UP: Our staff will call the number listed on your records the next business day following your procedure to check on you and address any questions or concerns that you may have regarding the information given to you following your procedure. If we do not reach you, we will leave a message.  However, if you are feeling well and you are not experiencing any problems, there is no need to return our call.  We will assume that you have returned to your regular daily activities without incident.  If any biopsies were taken you will be contacted by phone or by letter within the next 1-3 weeks.  Please call us at [phone] if you have not heard about the biopsies in 3 weeks.    SIGNATURES/CONFIDENTIALITY: You and/or your care partner have signed paperwork which will be entered into your electronic medical record.  These signatures attest to the fact that that the information above on your After Visit Summary has been reviewed and is understood.  Full responsibility of the confidentiality of this discharge information lies with you and/or your care-partner.    Handouts were given to your care partner on polyps, diverticulosis and hemorrhoids. You may resume your current medications today. Await biopsy results. Please call if any questions or concerns.

## 2017-12-26 NOTE — Progress Notes (Signed)
Pt's states no medical or surgical changes since previsit or office visit. 

## 2017-12-26 NOTE — Progress Notes (Signed)
Pt was tearful while Dr. Rush Landmark went over the findings with pt and her husband.  He explained findings and explained he did not mean to upset her.  She was understanding.  No problems noted in the recovery room. maw

## 2017-12-26 NOTE — Progress Notes (Signed)
Called to room to assist during endoscopic procedure.  Patient ID and intended procedure confirmed with present staff. Received instructions for my participation in the procedure from the performing physician.  

## 2017-12-26 NOTE — Progress Notes (Signed)
Report given to PACU, vss 

## 2017-12-27 ENCOUNTER — Telehealth: Payer: Self-pay | Admitting: *Deleted

## 2017-12-27 NOTE — Telephone Encounter (Signed)
  Follow up Call-  Call back number 12/26/2017  Post procedure Call Back phone  # [phone]  Permission to leave phone message Yes  Some recent data might be hidden     Patient questions:  Do you have a fever, pain , or abdominal swelling? No. Pain Score  0 *  Have you tolerated food without any problems? Yes.    Have you been able to return to your normal activities? Yes.    Do you have any questions about your discharge instructions: Diet   No. Medications  No. Follow up visit  No.  Do you have questions or concerns about your Care? No.  Actions: * If pain score is 4 or above: No action needed, pain <4.

## 2017-12-27 NOTE — Telephone Encounter (Signed)
  Follow up Call-  Call back number 12/26/2017  Post procedure Call Back phone  # [phone]  Permission to leave phone message Yes  Some recent data might be hidden     Patient questions:  Do you have a fever, pain , or abdominal swelling? No. Pain Score  0 *  Have you tolerated food without any problems? Yes.    Have you been able to return to your normal activities? Yes.    Do you have any questions about your discharge instructions: Diet   No. Medications  No. Follow up visit  No.  Do you have questions or concerns about your Care? No.  Actions: * If pain score is 4 or above: No action needed, pain <4.

## 2017-12-29 ENCOUNTER — Encounter: Payer: Self-pay | Admitting: Gastroenterology

## 2018-01-24 ENCOUNTER — Encounter: Payer: Self-pay | Admitting: Women's Health

## 2018-01-24 ENCOUNTER — Ambulatory Visit (INDEPENDENT_AMBULATORY_CARE_PROVIDER_SITE_OTHER): Payer: PRIVATE HEALTH INSURANCE | Admitting: Women's Health

## 2018-01-24 VITALS — BP 126/80 | Ht 67.0 in | Wt 108.0 lb

## 2018-01-24 DIAGNOSIS — E559 Vitamin D deficiency, unspecified: Secondary | ICD-10-CM

## 2018-01-24 DIAGNOSIS — Z01419 Encounter for gynecological examination (general) (routine) without abnormal findings: Secondary | ICD-10-CM

## 2018-01-24 DIAGNOSIS — Z13220 Encounter for screening for lipoid disorders: Secondary | ICD-10-CM | POA: Diagnosis not present

## 2018-01-24 NOTE — Addendum Note (Signed)
Addended by: Lorine Bears on: 01/24/2018 10:03 AM   Modules accepted: Orders

## 2018-01-24 NOTE — Patient Instructions (Signed)
Health Maintenance for Postmenopausal Women Menopause is a normal process in which your reproductive ability comes to an end. This process happens gradually over a span of months to years, usually between the ages of 22 and 9. Menopause is complete when you have missed 12 consecutive menstrual periods. It is important to talk with your health care provider about some of the most common conditions that affect postmenopausal women, such as heart disease, cancer, and bone loss (osteoporosis). Adopting a healthy lifestyle and getting preventive care can help to promote your health and wellness. Those actions can also lower your chances of developing some of these common conditions. What should I know about menopause? During menopause, you may experience a number of symptoms, such as:  Moderate-to-severe hot flashes.  Night sweats.  Decrease in sex drive.  Mood swings.  Headaches.  Tiredness.  Irritability.  Memory problems.  Insomnia.  Choosing to treat or not to treat menopausal changes is an individual decision that you make with your health care provider. What should I know about hormone replacement therapy and supplements? Hormone therapy products are effective for treating symptoms that are associated with menopause, such as hot flashes and night sweats. Hormone replacement carries certain risks, especially as you become older. If you are thinking about using estrogen or estrogen with progestin treatments, discuss the benefits and risks with your health care provider. What should I know about heart disease and stroke? Heart disease, heart attack, and stroke become more likely as you age. This may be due, in part, to the hormonal changes that your body experiences during menopause. These can affect how your body processes dietary fats, triglycerides, and cholesterol. Heart attack and stroke are both medical emergencies. There are many things that you can do to help prevent heart disease  and stroke:  Have your blood pressure checked at least every 1-2 years. High blood pressure causes heart disease and increases the risk of stroke.  If you are 53-22 years old, ask your health care provider if you should take aspirin to prevent a heart attack or a stroke.  Do not use any tobacco products, including cigarettes, chewing tobacco, or electronic cigarettes. If you need help quitting, ask your health care provider.  It is important to eat a healthy diet and maintain a healthy weight. ? Be sure to include plenty of vegetables, fruits, low-fat dairy products, and lean protein. ? Avoid eating foods that are high in solid fats, added sugars, or salt (sodium).  Get regular exercise. This is one of the most important things that you can do for your health. ? Try to exercise for at least 150 minutes each week. The type of exercise that you do should increase your heart rate and make you sweat. This is known as moderate-intensity exercise. ? Try to do strengthening exercises at least twice each week. Do these in addition to the moderate-intensity exercise.  Know your numbers.Ask your health care provider to check your cholesterol and your blood glucose. Continue to have your blood tested as directed by your health care provider.  What should I know about cancer screening? There are several types of cancer. Take the following steps to reduce your risk and to catch any cancer development as early as possible. Breast Cancer  Practice breast self-awareness. ? This means understanding how your breasts normally appear and feel. ? It also means doing regular breast self-exams. Let your health care provider know about any changes, no matter how small.  If you are 40  or older, have a clinician do a breast exam (clinical breast exam or CBE) every year. Depending on your age, family history, and medical history, it may be recommended that you also have a yearly breast X-ray (mammogram).  If you  have a family history of breast cancer, talk with your health care provider about genetic screening.  If you are at high risk for breast cancer, talk with your health care provider about having an MRI and a mammogram every year.  Breast cancer (BRCA) gene test is recommended for women who have family members with BRCA-related cancers. Results of the assessment will determine the need for genetic counseling and BRCA1 and for BRCA2 testing. BRCA-related cancers include these types: ? Breast. This occurs in males or females. ? Ovarian. ? Tubal. This may also be called fallopian tube cancer. ? Cancer of the abdominal or pelvic lining (peritoneal cancer). ? Prostate. ? Pancreatic.  Cervical, Uterine, and Ovarian Cancer Your health care provider may recommend that you be screened regularly for cancer of the pelvic organs. These include your ovaries, uterus, and vagina. This screening involves a pelvic exam, which includes checking for microscopic changes to the surface of your cervix (Pap test).  For women ages 21-65, health care providers may recommend a pelvic exam and a Pap test every three years. For women ages 79-65, they may recommend the Pap test and pelvic exam, combined with testing for human papilloma virus (HPV), every five years. Some types of HPV increase your risk of cervical cancer. Testing for HPV may also be done on women of any age who have unclear Pap test results.  Other health care providers may not recommend any screening for nonpregnant women who are considered low risk for pelvic cancer and have no symptoms. Ask your health care provider if a screening pelvic exam is right for you.  If you have had past treatment for cervical cancer or a condition that could lead to cancer, you need Pap tests and screening for cancer for at least 20 years after your treatment. If Pap tests have been discontinued for you, your risk factors (such as having a new sexual partner) need to be  reassessed to determine if you should start having screenings again. Some women have medical problems that increase the chance of getting cervical cancer. In these cases, your health care provider may recommend that you have screening and Pap tests more often.  If you have a family history of uterine cancer or ovarian cancer, talk with your health care provider about genetic screening.  If you have vaginal bleeding after reaching menopause, tell your health care provider.  There are currently no reliable tests available to screen for ovarian cancer.  Lung Cancer Lung cancer screening is recommended for adults 69-62 years old who are at high risk for lung cancer because of a history of smoking. A yearly low-dose CT scan of the lungs is recommended if you:  Currently smoke.  Have a history of at least 30 pack-years of smoking and you currently smoke or have quit within the past 15 years. A pack-year is smoking an average of one pack of cigarettes per day for one year.  Yearly screening should:  Continue until it has been 15 years since you quit.  Stop if you develop a health problem that would prevent you from having lung cancer treatment.  Colorectal Cancer  This type of cancer can be detected and can often be prevented.  Routine colorectal cancer screening usually begins at  age 42 and continues through age 45.  If you have risk factors for colon cancer, your health care provider may recommend that you be screened at an earlier age.  If you have a family history of colorectal cancer, talk with your health care provider about genetic screening.  Your health care provider may also recommend using home test kits to check for hidden blood in your stool.  A small camera at the end of a tube can be used to examine your colon directly (sigmoidoscopy or colonoscopy). This is done to check for the earliest forms of colorectal cancer.  Direct examination of the colon should be repeated every  5-10 years until age 71. However, if early forms of precancerous polyps or small growths are found or if you have a family history or genetic risk for colorectal cancer, you may need to be screened more often.  Skin Cancer  Check your skin from head to toe regularly.  Monitor any moles. Be sure to tell your health care provider: ? About any new moles or changes in moles, especially if there is a change in a mole's shape or color. ? If you have a mole that is larger than the size of a pencil eraser.  If any of your family members has a history of skin cancer, especially at a Sylus Stgermain age, talk with your health care provider about genetic screening.  Always use sunscreen. Apply sunscreen liberally and repeatedly throughout the day.  Whenever you are outside, protect yourself by wearing long sleeves, pants, a wide-brimmed hat, and sunglasses.  What should I know about osteoporosis? Osteoporosis is a condition in which bone destruction happens more quickly than new bone creation. After menopause, you may be at an increased risk for osteoporosis. To help prevent osteoporosis or the bone fractures that can happen because of osteoporosis, the following is recommended:  If you are 46-71 years old, get at least 1,000 mg of calcium and at least 600 mg of vitamin D per day.  If you are older than age 55 but younger than age 65, get at least 1,200 mg of calcium and at least 600 mg of vitamin D per day.  If you are older than age 54, get at least 1,200 mg of calcium and at least 800 mg of vitamin D per day.  Smoking and excessive alcohol intake increase the risk of osteoporosis. Eat foods that are rich in calcium and vitamin D, and do weight-bearing exercises several times each week as directed by your health care provider. What should I know about how menopause affects my mental health? Depression may occur at any age, but it is more common as you become older. Common symptoms of depression  include:  Low or sad mood.  Changes in sleep patterns.  Changes in appetite or eating patterns.  Feeling an overall lack of motivation or enjoyment of activities that you previously enjoyed.  Frequent crying spells.  Talk with your health care provider if you think that you are experiencing depression. What should I know about immunizations? It is important that you get and maintain your immunizations. These include:  Tetanus, diphtheria, and pertussis (Tdap) booster vaccine.  Influenza every year before the flu season begins.  Pneumonia vaccine.  Shingles vaccine.  Your health care provider may also recommend other immunizations. This information is not intended to replace advice given to you by your health care provider. Make sure you discuss any questions you have with your health care provider. Document Released: 05/20/2005  Document Revised: 10/16/2015 Document Reviewed: 12/30/2014 Elsevier Interactive Patient Education  2018 [employer]  

## 2018-01-24 NOTE — Progress Notes (Signed)
Lindsey Sanford 06/03/1960 [id number]    History:    Presents for annual exam.  Menopausal on no HRT with no bleeding.  Normal Pap and mammogram history.  12/2017- colon polyps 5-year follow-up.  Continues to struggle with anxiety but it is better,  no medication.  Past medical history, past surgical history, family history and social history were all reviewed and documented in the EPIC chart.  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  Father diabetes, mother 43, healthy lives at [facility].  3 children all doing well, daughter and medical residency in [place], other daughter Lindsey Buff PhD with a 29-year-old baby, son working in [place] as Psychologist, prison and probation services.  ROS:  A ROS was performed and pertinent positives and negatives are included.  Exam:  Vitals:   01/24/18 0919  BP: 126/80  Weight: 108 lb (49 kg)  Height: 5\' 7"  (1.702 m)   Body mass index is 16.92 kg/m.   General appearance:  Normal Thyroid:  Symmetrical, normal in size, without palpable masses or nodularity. Respiratory  Auscultation:  Clear without wheezing or rhonchi Cardiovascular  Auscultation:  Regular rate, without rubs, murmurs or gallops  Edema/varicosities:  Not grossly evident Abdominal  Soft,nontender, without masses, guarding or rebound.  Liver/spleen:  No organomegaly noted  Hernia:  None appreciated  Skin  Inspection:  Grossly normal   Breasts: Examined lying and sitting.     Right: Without masses, retractions, discharge or axillary adenopathy.     Left: Without masses, retractions, discharge or axillary adenopathy. Gentitourinary   Inguinal/mons:  Normal without inguinal adenopathy  External genitalia:  Normal  BUS/Urethra/Skene's glands:  Normal  Vagina: Atrophic  Cervix:  Normal  Uterus:   normal in size, shape and contour.  Midline and mobile  Adnexa/parametria:     Rt: Without masses or tenderness.   Lt: Without masses or tenderness.  Anus and perineum: Normal  Digital rectal exam: Normal  sphincter tone without palpated masses or tenderness  Assessment/Plan:  57 y.o. MWF G3, P3 for annual exam with no complaints.  Postmenopausal/no HRT/no bleeding September 2019 tick bite treated with doxycycline Anxiety-stable on no medication  Plan: SBE's, continue annual screening mammogram, calcium rich foods, vitamin D 2000 daily encouraged.  Encouraged leisure activities, weightbearing as well as balance type exercise.  Vaginal lubricants encouraged,  declines vaginal estrogen.  DEXA, declines at this time.  CBC, CMP, lipid panel, vitamin D, Pap with HR HPV typing.  New screening guidelines reviewed.  [place], 9:56 AM 01/24/2018

## 2018-01-25 LAB — COMPREHENSIVE METABOLIC PANEL
AG RATIO: 1.4 (calc) (ref 1.0–2.5)
ALBUMIN MSPROF: 4 g/dL (ref 3.6–5.1)
ALT: 14 U/L (ref 6–29)
AST: 19 U/L (ref 10–35)
Alkaline phosphatase (APISO): 59 U/L (ref 33–130)
BUN: 10 mg/dL (ref 7–25)
CO2: 24 mmol/L (ref 20–32)
Calcium: 9.1 mg/dL (ref 8.6–10.4)
Chloride: 98 mmol/L (ref 98–110)
Creat: 0.82 mg/dL (ref 0.50–1.05)
GLOBULIN: 2.8 g/dL (ref 1.9–3.7)
Glucose, Bld: 85 mg/dL (ref 65–99)
POTASSIUM: 4 mmol/L (ref 3.5–5.3)
SODIUM: 131 mmol/L — AB (ref 135–146)
TOTAL PROTEIN: 6.8 g/dL (ref 6.1–8.1)
Total Bilirubin: 0.5 mg/dL (ref 0.2–1.2)

## 2018-01-25 LAB — CBC WITH DIFFERENTIAL/PLATELET
Basophils Absolute: 88 cells/uL (ref 0–200)
Basophils Relative: 1.3 %
EOS PCT: 4.1 %
Eosinophils Absolute: 279 cells/uL (ref 15–500)
HCT: 40 % (ref 35.0–45.0)
HEMOGLOBIN: 13.6 g/dL (ref 11.7–15.5)
LYMPHS ABS: 1904 {cells}/uL (ref 850–3900)
MCH: 29.6 pg (ref 27.0–33.0)
MCHC: 34 g/dL (ref 32.0–36.0)
MCV: 87.1 fL (ref 80.0–100.0)
MPV: 9.5 fL (ref 7.5–12.5)
Monocytes Relative: 8.1 %
NEUTROS ABS: 3978 {cells}/uL (ref 1500–7800)
Neutrophils Relative %: 58.5 %
Platelets: 266 10*3/uL (ref 140–400)
RBC: 4.59 10*6/uL (ref 3.80–5.10)
RDW: 12.6 % (ref 11.0–15.0)
Total Lymphocyte: 28 %
WBC mixed population: 551 cells/uL (ref 200–950)
WBC: 6.8 10*3/uL (ref 3.8–10.8)

## 2018-01-25 LAB — LIPID PANEL
CHOL/HDL RATIO: 2.7 (calc) (ref <5.0)
Cholesterol: 216 mg/dL — ABNORMAL HIGH (ref <200)
HDL: 80 mg/dL (ref >50)
LDL CHOLESTEROL (CALC): 119 mg/dL — AB
Non-HDL Cholesterol (Calc): 136 mg/dL (calc) — ABNORMAL HIGH (ref <130)
TRIGLYCERIDES: 73 mg/dL (ref <150)

## 2018-01-25 LAB — VITAMIN D 25 HYDROXY (VIT D DEFICIENCY, FRACTURES): Vit D, 25-Hydroxy: 46 ng/mL (ref 30–100)

## 2018-01-26 LAB — PAP, TP IMAGING W/ HPV RNA, RFLX HPV TYPE 16,18/45: HPV DNA High Risk: NOT DETECTED

## 2018-08-09 ENCOUNTER — Other Ambulatory Visit: Payer: Self-pay | Admitting: Women's Health

## 2018-08-09 ENCOUNTER — Telehealth: Payer: Self-pay | Admitting: *Deleted

## 2018-08-09 MED ORDER — FLUOXETINE HCL 10 MG PO CAPS: 10.0000 mg | ORAL_CAPSULE | Freq: Every day | ORAL | 1 refills | Status: DC

## 2018-08-09 NOTE — Telephone Encounter (Addendum)
You are back up MD) Patient called c/o lots of stress related to COVID-19 called to report elevated blood pressure readings. Yesterday her blood pressure reading at work was 150/90 and this am with her personal cuff at 6am it was 170/90. Patient was tearful on phone when talking. Patient does not have PCP. Has nancy listed as PCP. Please advise

## 2018-08-09 NOTE — Telephone Encounter (Signed)
TC, tearful, states very stressed at work with covid 19 at [employer].  Encouraged to call counselor and may be time to try med, agreeable, tried lexapro in the past had nausea. Will try prozac 10 mg reviewed very low dose, may need to increase to 20mg .  Encouraged self care and leisure activities. Will reassess in 3 mo.  Recheck B/P when calm, if continues elevated see a primary care for possible med.  Mother is 90 and not hypertensive.

## 2018-08-14 ENCOUNTER — Ambulatory Visit (INDEPENDENT_AMBULATORY_CARE_PROVIDER_SITE_OTHER): Payer: PRIVATE HEALTH INSURANCE | Admitting: Licensed Clinical Social Worker

## 2018-08-14 DIAGNOSIS — F419 Anxiety disorder, unspecified: Secondary | ICD-10-CM

## 2018-08-15 IMAGING — MG DIGITAL SCREENING BILATERAL MAMMOGRAM WITH TOMO AND CAD
7 series · 9 of 19 positions shown · non-contrast
Comparison: Previous exam(s).

CLINICAL DATA: Screening.

EXAM:
DIGITAL SCREENING BILATERAL MAMMOGRAM WITH TOMO AND CAD

[L CC synth-2D]
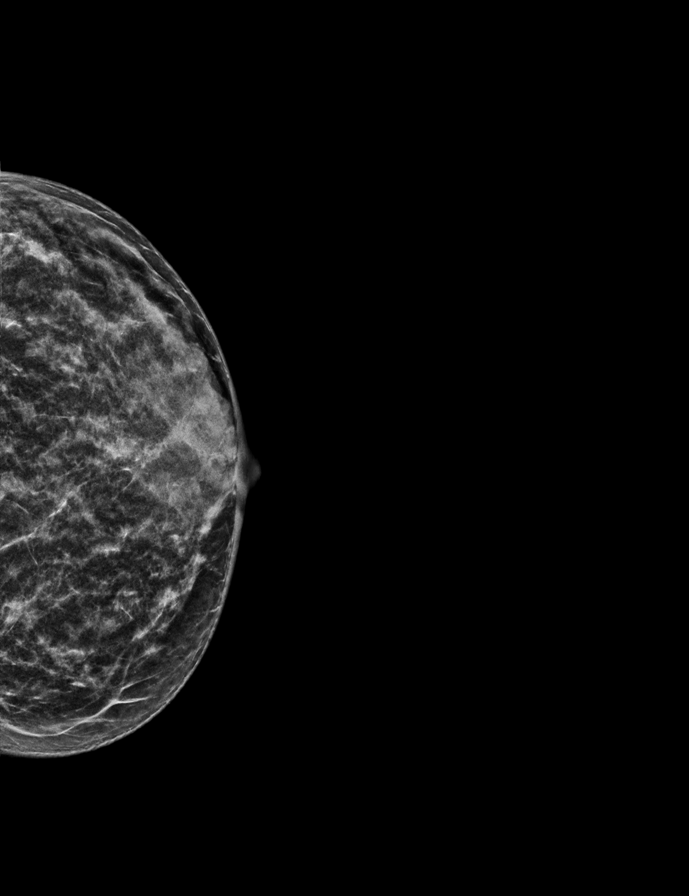

[R MLO synth-2D]
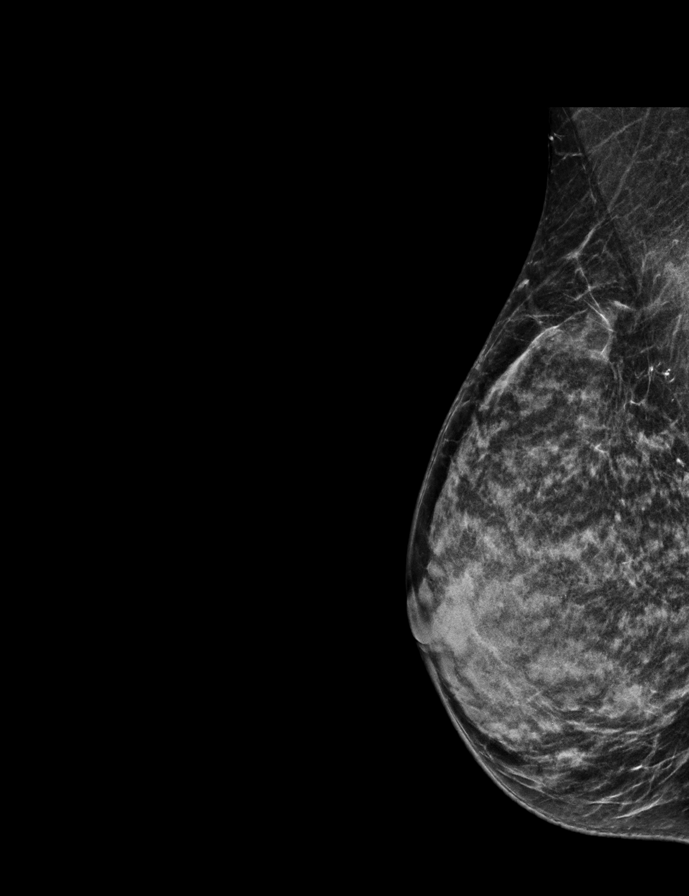

[R CC synth-2D]
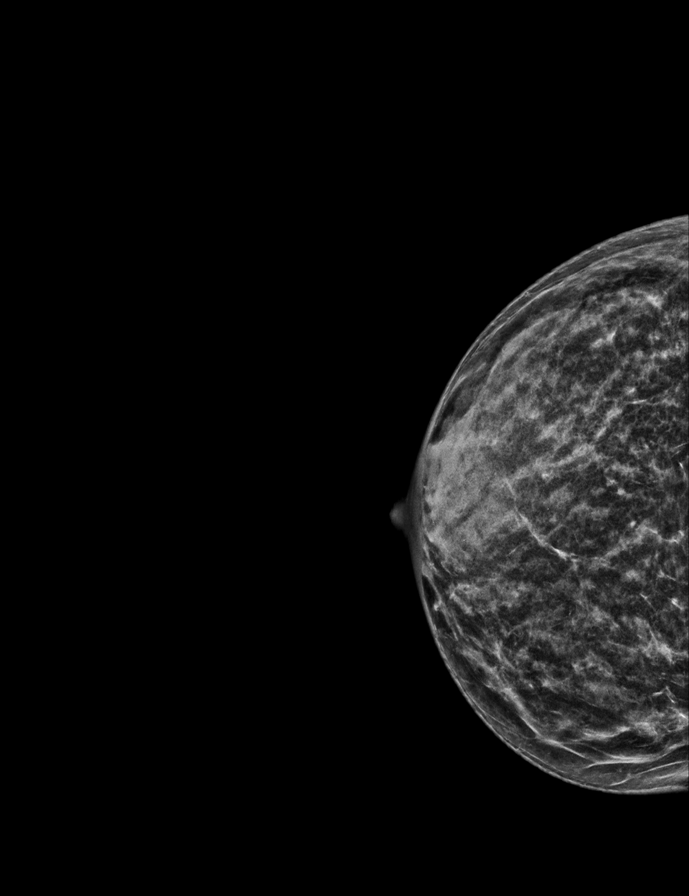

[L MLO synth-2D]
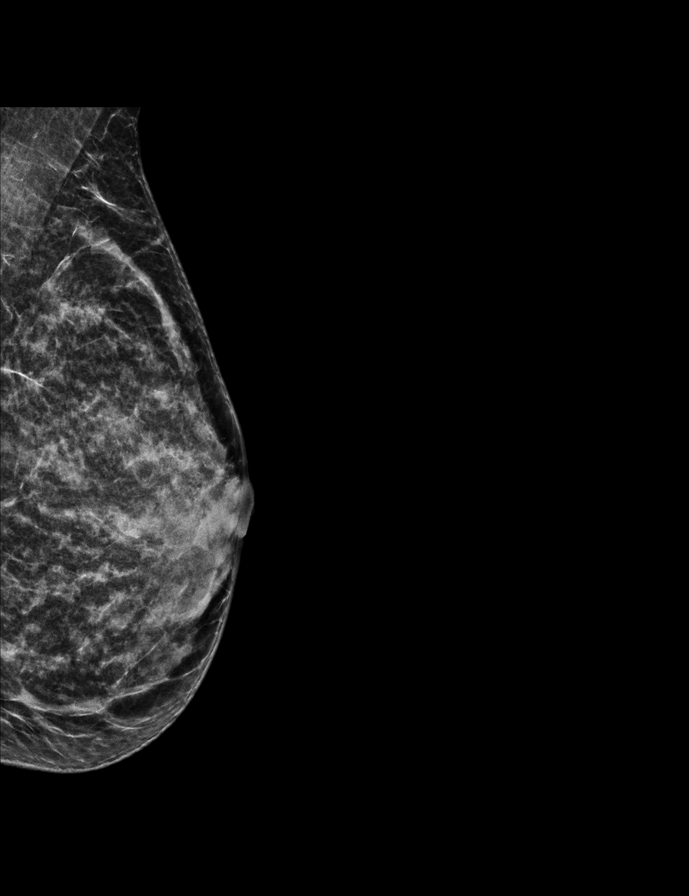

[R MLO tomo · 3 of 41 frames shown]
[frame 14/41]
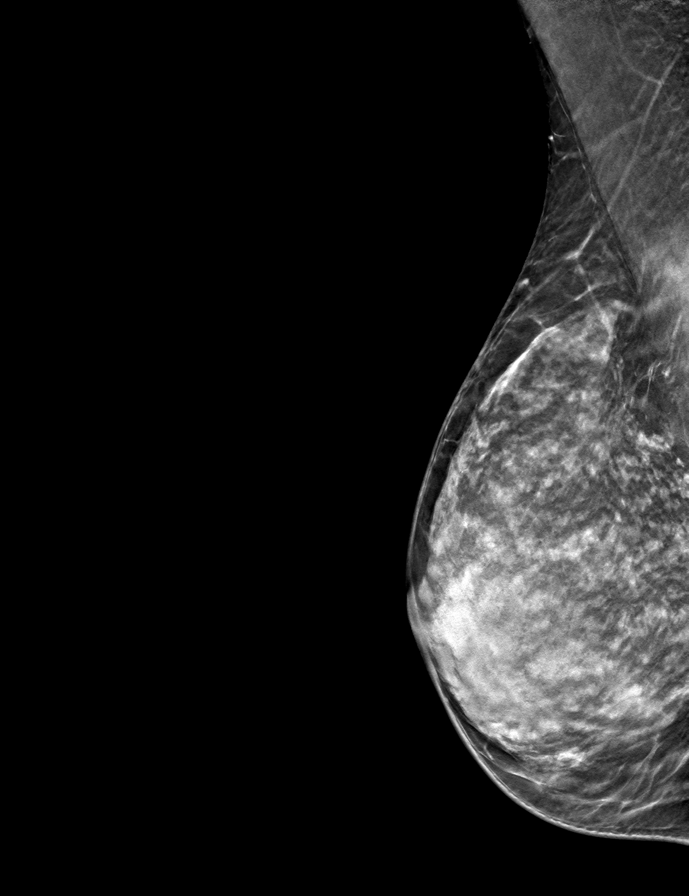
[frame 21/41]
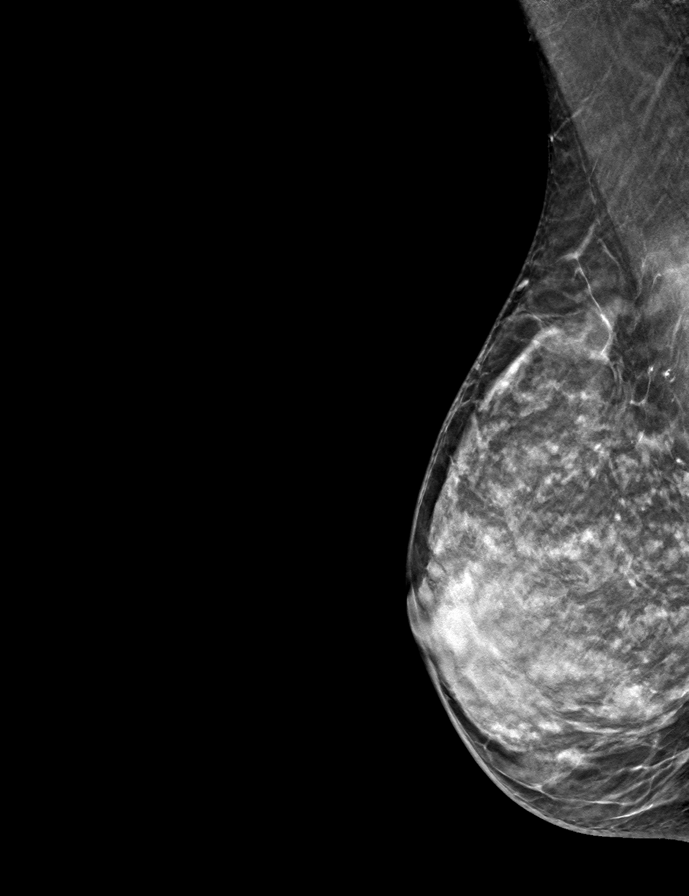
[frame 28/41]
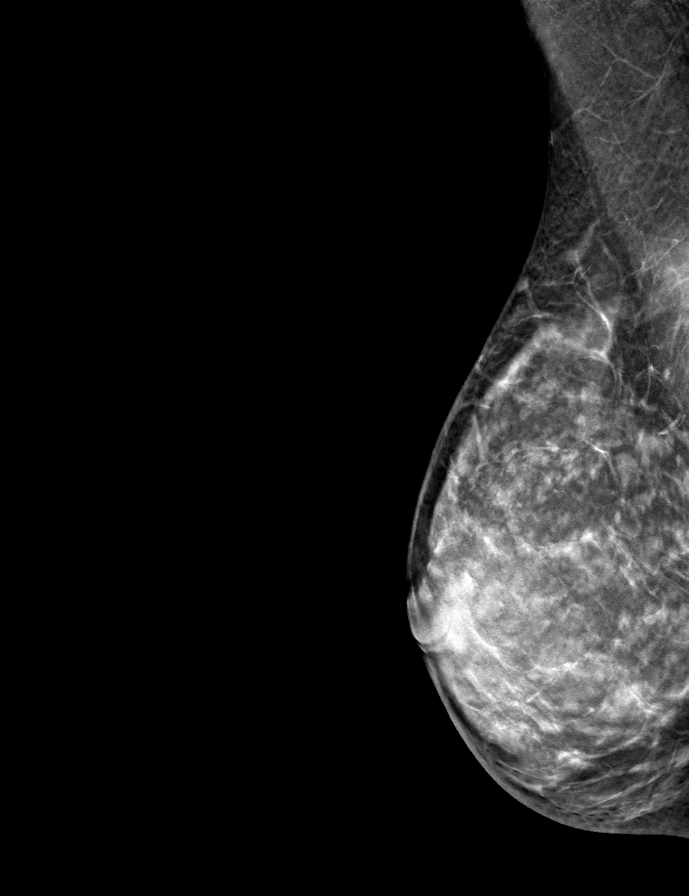

[R CC tomo · tomo slice 21/41.0]
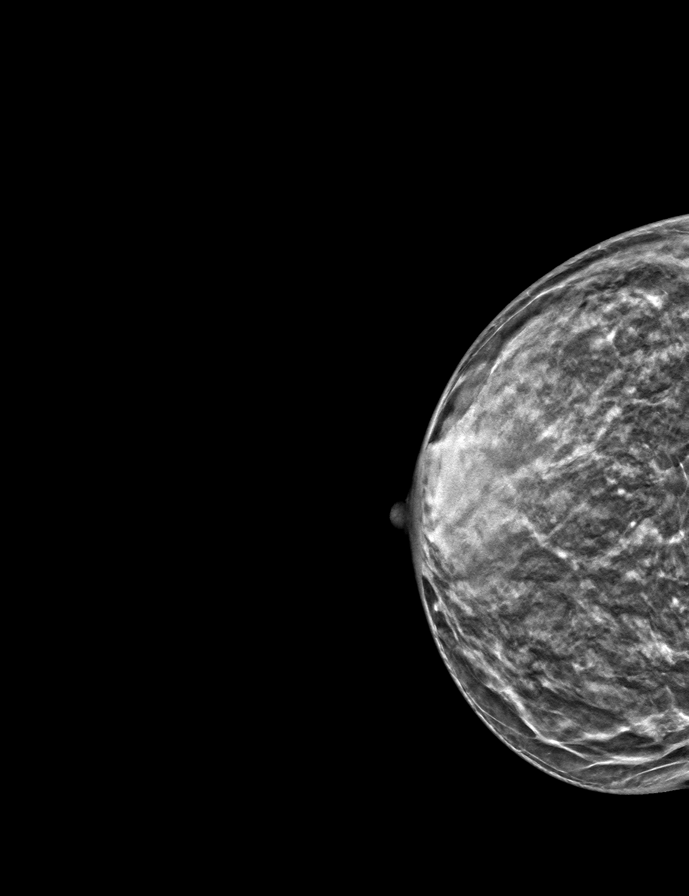

[L CC tomo · tomo slice 20/39.0]
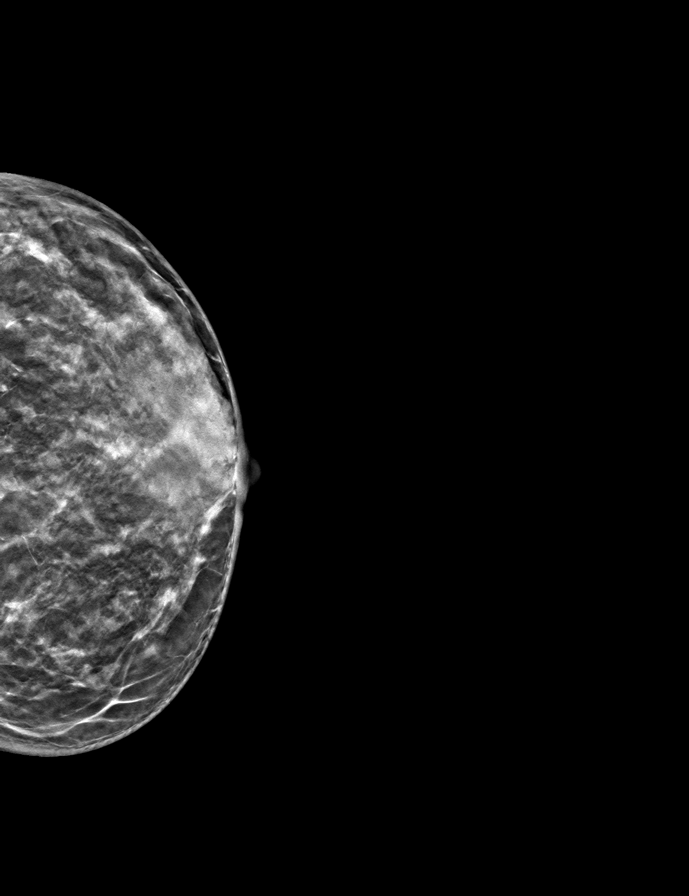

[9 of 19 positions shown; findings below may reference images not displayed]

ACR Breast Density Category c: The breast tissue is heterogeneously
dense, which may obscure small masses.
FINDINGS: There are no findings suspicious for malignancy. Images were
processed with CAD.
IMPRESSION: No mammographic evidence of malignancy. A result letter of this
screening mammogram will be mailed directly to the patient.

RECOMMENDATION:
Screening mammogram in one year. (Code:[id number]-U-LHB)

BI-RADS CATEGORY  1: Negative.

## 2018-08-30 ENCOUNTER — Telehealth: Payer: Self-pay | Admitting: *Deleted

## 2018-08-30 ENCOUNTER — Ambulatory Visit (INDEPENDENT_AMBULATORY_CARE_PROVIDER_SITE_OTHER): Payer: PRIVATE HEALTH INSURANCE | Admitting: Licensed Clinical Social Worker

## 2018-08-30 DIAGNOSIS — F419 Anxiety disorder, unspecified: Secondary | ICD-10-CM | POA: Diagnosis not present

## 2018-08-30 NOTE — Telephone Encounter (Signed)
Patient called to follow up from telephone encounter 08/09/18, patient said she is taking Prozac, has PCP visit next week, blood pressure still elevated, had visit with [name] recommended patient do a trial of Xanax. Are you willing to prescribe? Please advise

## 2018-08-30 NOTE — Telephone Encounter (Signed)
Left message for pt to call.

## 2018-08-30 NOTE — Telephone Encounter (Signed)
Yes ok for xANax .25, review start with 1/2 tab, addictive to use sparingly.  #30 with 1 refill

## 2018-09-05 ENCOUNTER — Ambulatory Visit: Payer: PRIVATE HEALTH INSURANCE | Admitting: Family Medicine

## 2018-09-05 ENCOUNTER — Other Ambulatory Visit: Payer: Self-pay

## 2018-09-05 ENCOUNTER — Encounter: Payer: Self-pay | Admitting: Family Medicine

## 2018-09-05 VITALS — BP 130/78 | HR 87 | Temp 98.7°F | Resp 16 | Ht 67.5 in | Wt 108.2 lb

## 2018-09-05 DIAGNOSIS — R030 Elevated blood-pressure reading, without diagnosis of hypertension: Secondary | ICD-10-CM | POA: Diagnosis not present

## 2018-09-05 DIAGNOSIS — F439 Reaction to severe stress, unspecified: Secondary | ICD-10-CM

## 2018-09-05 DIAGNOSIS — G4700 Insomnia, unspecified: Secondary | ICD-10-CM

## 2018-09-05 DIAGNOSIS — F411 Generalized anxiety disorder: Secondary | ICD-10-CM | POA: Diagnosis not present

## 2018-09-05 DIAGNOSIS — R002 Palpitations: Secondary | ICD-10-CM

## 2018-09-05 MED ORDER — HYDROXYZINE HCL 25 MG PO TABS: 12.5000 mg | ORAL_TABLET | Freq: Every evening | ORAL | 0 refills | Status: DC | PRN

## 2018-09-05 NOTE — Patient Instructions (Addendum)
Blood pressure looks okay here today.  I am glad to hear that some of her anxiety symptoms have improved with fluoxetine, but I expect that those should continue to improve medication and meeting with her therapist.  See information below on anxiety and stress management.  Continue meditation if that is helpful, and walking for exercise can also be helpful.  I will check some electrolytes, thyroid test but if the heart palpitations return or worsen, return for other testing.  No change in fluoxetine dose for now.  Hydroxyzine if needed at bedtime.  See information below on insomnia.  Follow-up in 3 weeks, sooner if needed.    Generalized Anxiety Disorder, Adult Generalized anxiety disorder (GAD) is a mental health disorder. People with this condition constantly worry about everyday events. Unlike normal anxiety, worry related to GAD is not triggered by a specific event. These worries also do not fade or get better with time. GAD interferes with life functions, including relationships, work, and school. GAD can vary from mild to severe. People with severe GAD can have intense waves of anxiety with physical symptoms (panic attacks). What are the causes? The exact cause of GAD is not known. What increases the risk? This condition is more likely to develop in:  Women.  People who have a family history of anxiety disorders.  People who are very shy.  People who experience very stressful life events, such as the death of a loved one.  People who have a very stressful family environment. What are the signs or symptoms? People with GAD often worry excessively about many things in their lives, such as their health and family. They may also be overly concerned about:  Doing well at work.  Being on time.  Natural disasters.  Friendships. Physical symptoms of GAD include:  Fatigue.  Muscle tension or having muscle twitches.  Trembling or feeling shaky.  Being easily startled.  Feeling  like your heart is pounding or racing.  Feeling out of breath or like you cannot take a deep breath.  Having trouble falling asleep or staying asleep.  Sweating.  Nausea, diarrhea, or irritable bowel syndrome (IBS).  Headaches.  Trouble concentrating or remembering facts.  Restlessness.  Irritability. How is this diagnosed? Your health care provider can diagnose GAD based on your symptoms and medical history. You will also have a physical exam. The health care provider will ask specific questions about your symptoms, including how severe they are, when they started, and if they come and go. Your health care provider may ask you about your use of alcohol or drugs, including prescription medicines. Your health care provider may refer you to a mental health specialist for further evaluation. Your health care provider will do a thorough examination and may perform additional tests to rule out other possible causes of your symptoms. To be diagnosed with GAD, a person must have anxiety that:  Is out of his or her control.  Affects several different aspects of his or her life, such as work and relationships.  Causes distress that makes him or her unable to take part in normal activities.  Includes at least three physical symptoms of GAD, such as restlessness, fatigue, trouble concentrating, irritability, muscle tension, or sleep problems. Before your health care provider can confirm a diagnosis of GAD, these symptoms must be present more days than they are not, and they must last for six months or longer. How is this treated? The following therapies are usually used to treat GAD:  Medicine.  Antidepressant medicine is usually prescribed for long-term daily control. Antianxiety medicines may be added in severe cases, especially when panic attacks occur.  Talk therapy (psychotherapy). Certain types of talk therapy can be helpful in treating GAD by providing support, education, and guidance.  Options include: ? Cognitive behavioral therapy (CBT). People learn coping skills and techniques to ease their anxiety. They learn to identify unrealistic or negative thoughts and behaviors and to replace them with positive ones. ? Acceptance and commitment therapy (ACT). This treatment teaches people how to be mindful as a way to cope with unwanted thoughts and feelings. ? Biofeedback. This process trains you to manage your body's response (physiological response) through breathing techniques and relaxation methods. You will work with a therapist while machines are used to monitor your physical symptoms.  Stress management techniques. These include yoga, meditation, and exercise. A mental health specialist can help determine which treatment is best for you. Some people see improvement with one type of therapy. However, other people require a combination of therapies. Follow these instructions at home:  Take over-the-counter and prescription medicines only as told by your health care provider.  Try to maintain a normal routine.  Try to anticipate stressful situations and allow extra time to manage them.  Practice any stress management or self-calming techniques as taught by your health care provider.  Do not punish yourself for setbacks or for not making progress.  Try to recognize your accomplishments, even if they are small.  Keep all follow-up visits as told by your health care provider. This is important. Contact a health care provider if:  Your symptoms do not get better.  Your symptoms get worse.  You have signs of depression, such as: ? A persistently sad, cranky, or irritable mood. ? Loss of enjoyment in activities that used to bring you joy. ? Change in weight or eating. ? Changes in sleeping habits. ? Avoiding friends or family members. ? Loss of energy for normal tasks. ? Feelings of guilt or worthlessness. Get help right away if:  You have serious thoughts about  hurting yourself or others. If you ever feel like you may hurt yourself or others, or have thoughts about taking your own life, get help right away. You can go to your nearest emergency department or call:  Your local emergency services (911 in the [place]  A suicide crisis helpline, such as the [place] at 340 083 0543. This is open 24 hours a day. Summary  Generalized anxiety disorder (GAD) is a mental health disorder that involves worry that is not triggered by a specific event.  People with GAD often worry excessively about many things in their lives, such as their health and family.  GAD may cause physical symptoms such as restlessness, trouble concentrating, sleep problems, frequent sweating, nausea, diarrhea, headaches, and trembling or muscle twitching.  A mental health specialist can help determine which treatment is best for you. Some people see improvement with one type of therapy. However, other people require a combination of therapies. This information is not intended to replace advice given to you by your health care provider. Make sure you discuss any questions you have with your health care provider. Document Released: 07/23/2012 Document Revised: 02/16/2016 Document Reviewed: 02/16/2016 Elsevier Interactive Patient Education  2019 [employer].  How to Take Your Blood Pressure You can take your blood pressure at home with a machine. You may need to check your blood pressure at home:  To check if you have high blood  pressure (hypertension).  To check your blood pressure over time.  To make sure your blood pressure medicine is working. Supplies needed: You will need a blood pressure machine, or monitor. You can buy one at a drugstore or online. When choosing one:  Choose one with an arm cuff.  Choose one that wraps around your upper arm. Only one finger should fit between your arm and the cuff.  Do not choose one that measures your blood  pressure from your wrist or finger. Your doctor can suggest a monitor. How to prepare Avoid these things for 30 minutes before checking your blood pressure:  Drinking caffeine.  Drinking alcohol.  Eating.  Smoking.  Exercising. Five minutes before checking your blood pressure:  Pee.  Sit in a dining chair. Avoid sitting in a soft couch or armchair.  Be quiet. Do not talk. How to take your blood pressure Follow the instructions that came with your machine. If you have a digital blood pressure monitor, these may be the instructions: 1. Sit up straight. 2. Place your feet on the floor. Do not cross your ankles or legs. 3. Rest your left arm at the level of your heart. You may rest it on a table, desk, or chair. 4. Pull up your shirt sleeve. 5. Wrap the blood pressure cuff around the upper part of your left arm. The cuff should be 1 inch (2.5 cm) above your elbow. It is best to wrap the cuff around bare skin. 6. Fit the cuff snugly around your arm. You should be able to place only one finger between the cuff and your arm. 7. Put the cord inside the groove of your elbow. 8. Press the power button. 9. Sit quietly while the cuff fills with air and loses air. 10. Write down the numbers on the screen. 11. Wait 2-3 minutes and then repeat steps 1-10. What do the numbers mean? Two numbers make up your blood pressure. The first number is called systolic pressure. The second is called diastolic pressure. An example of a blood pressure reading is "120 over 80" (or 120/80). If you are an adult and do not have a medical condition, use this guide to find out if your blood pressure is normal: Normal  First number: below 120.  Second number: below 80. Elevated  First number: 120-129.  Second number: below 80. Hypertension stage 1  First number: 130-139.  Second number: 80-89. Hypertension stage 2  First number: 140 or above.  Second number: 17 or above. Your blood pressure is  above normal even if only the top or bottom number is above normal. Follow these instructions at home:  Check your blood pressure as often as your doctor tells you to.  Take your monitor to your next doctor's appointment. Your doctor will: ? Make sure you are using it correctly. ? Make sure it is working right.  Make sure you understand what your blood pressure numbers should be.  Tell your doctor if your medicines are causing side effects. Contact a doctor if:  Your blood pressure keeps being high. Get help right away if:  Your first blood pressure number is higher than 180.  Your second blood pressure number is higher than 120. This information is not intended to replace advice given to you by your health care provider. Make sure you discuss any questions you have with your health care provider. Document Released: 03/10/2008 Document Revised: 02/24/2016 Document Reviewed: 09/04/2015 Elsevier Interactive Patient Education  2019 [employer].  Insomnia  Insomnia is a sleep disorder that makes it difficult to fall asleep or stay asleep. Insomnia can cause fatigue, low energy, difficulty concentrating, mood swings, and poor performance at work or school. There are three different ways to classify insomnia:  Difficulty falling asleep.  Difficulty staying asleep.  Waking up too early in the morning. Any type of insomnia can be long-term (chronic) or short-term (acute). Both are common. Short-term insomnia usually lasts for three months or less. Chronic insomnia occurs at least three times a week for longer than three months. What are the causes? Insomnia may be caused by another condition, situation, or substance, such as:  Anxiety.  Certain medicines.  Gastroesophageal reflux disease (GERD) or other gastrointestinal conditions.  Asthma or other breathing conditions.  Restless legs syndrome, sleep apnea, or other sleep disorders.  Chronic  pain.  Menopause.  Stroke.  Abuse of alcohol, tobacco, or illegal drugs.  Mental health conditions, such as depression.  Caffeine.  Neurological disorders, such as Alzheimer's disease.  An overactive thyroid (hyperthyroidism). Sometimes, the cause of insomnia may not be known. What increases the risk? Risk factors for insomnia include:  Gender. Women are affected more often than men.  Age. Insomnia is more common as you get older.  Stress.  Lack of exercise.  Irregular work schedule or working night shifts.  Traveling between different time zones.  Certain medical and mental health conditions. What are the signs or symptoms? If you have insomnia, the main symptom is having trouble falling asleep or having trouble staying asleep. This may lead to other symptoms, such as:  Feeling fatigued or having low energy.  Feeling nervous about going to sleep.  Not feeling rested in the morning.  Having trouble concentrating.  Feeling irritable, anxious, or depressed. How is this diagnosed? This condition may be diagnosed based on:  Your symptoms and medical history. Your health care provider may ask about: ? Your sleep habits. ? Any medical conditions you have. ? Your mental health.  A physical exam. How is this treated? Treatment for insomnia depends on the cause. Treatment may focus on treating an underlying condition that is causing insomnia. Treatment may also include:  Medicines to help you sleep.  Counseling or therapy.  Lifestyle adjustments to help you sleep better. Follow these instructions at home: Eating and drinking   Limit or avoid alcohol, caffeinated beverages, and cigarettes, especially close to bedtime. These can disrupt your sleep.  Do not eat a large meal or eat spicy foods right before bedtime. This can lead to digestive discomfort that can make it hard for you to sleep. Sleep habits   Keep a sleep diary to help you and your health care  provider figure out what could be causing your insomnia. Write down: ? When you sleep. ? When you wake up during the night. ? How well you sleep. ? How rested you feel the next day. ? Any side effects of medicines you are taking. ? What you eat and drink.  Make your bedroom a dark, comfortable place where it is easy to fall asleep. ? Put up shades or blackout curtains to block light from outside. ? Use a white noise machine to block noise. ? Keep the temperature cool.  Limit screen use before bedtime. This includes: ? Watching TV. ? Using your smartphone, tablet, or computer.  Stick to a routine that includes going to bed and waking up at the same times every day and night. This can help you fall asleep faster. Consider  making a quiet activity, such as reading, part of your nighttime routine.  Try to avoid taking naps during the day so that you sleep better at night.  Get out of bed if you are still awake after 15 minutes of trying to sleep. Keep the lights down, but try reading or doing a quiet activity. When you feel sleepy, go back to bed. General instructions  Take over-the-counter and prescription medicines only as told by your health care provider.  Exercise regularly, as told by your health care provider. Avoid exercise starting several hours before bedtime.  Use relaxation techniques to manage stress. Ask your health care provider to suggest some techniques that may work well for you. These may include: ? Breathing exercises. ? Routines to release muscle tension. ? Visualizing peaceful scenes.  Make sure that you drive carefully. Avoid driving if you feel very sleepy.  Keep all follow-up visits as told by your health care provider. This is important. Contact a health care provider if:  You are tired throughout the day.  You have trouble in your daily routine due to sleepiness.  You continue to have sleep problems, or your sleep problems get worse. Get help right  away if:  You have serious thoughts about hurting yourself or someone else. If you ever feel like you may hurt yourself or others, or have thoughts about taking your own life, get help right away. You can go to your nearest emergency department or call:  Your local emergency services (911 in the [place]  A suicide crisis helpline, such as the [place] at (743) 040-1115. This is open 24 hours a day. Summary  Insomnia is a sleep disorder that makes it difficult to fall asleep or stay asleep.  Insomnia can be long-term (chronic) or short-term (acute).  Treatment for insomnia depends on the cause. Treatment may focus on treating an underlying condition that is causing insomnia.  Keep a sleep diary to help you and your health care provider figure out what could be causing your insomnia. This information is not intended to replace advice given to you by your health care provider. Make sure you discuss any questions you have with your health care provider. Document Released: 03/25/2000 Document Revised: 01/05/2017 Document Reviewed: 01/05/2017 Elsevier Interactive Patient Education  [employer].   If you have lab work done today you will be contacted with your lab results within the next 2 weeks.  If you have not heard from [place] then please contact us. The fastest way to get your results is to register for My Chart.   IF you received an x-ray today, you will receive an invoice from [facility]. Please contact [facility] at [phone] with questions or concerns regarding your invoice.   IF you received labwork today, you will receive an invoice from [place]. Please contact [facility] at [phone] with questions or concerns regarding your invoice.   Our billing staff will not be able to assist you with questions regarding bills from these companies.  You will be contacted with the lab results as soon as they are available. The fastest way  to get your results is to activate your My Chart account. Instructions are located on the last page of this paperwork. If you have not heard from [place] regarding the results in 2 weeks, please contact this office.

## 2018-09-05 NOTE — Progress Notes (Signed)
Subjective:    Patient ID: Lindsey Sanford, female    DOB: 10-Apr-1961, 58 y.o.   MRN: [id number]  HPI Lindsey Sanford is a 58 y.o. female Presents today for: Chief Complaint  Patient presents with  . Establish Care    Patient stated the reason for her appt is for elevated blood pressure for the last few week Bp had been 170/90 187/87, 166/89, last week went to urgent care it was 188/83 then went down to 160/80.Called gynocologist and she sent in A  RX for fluoxetine. My Estill Bamberg had been doing my wellness physical up until this point. Patients bp machine read 173/98 on left and my repeat on left was 170/80   Lindsey Sanford is a 58 y.o. female CC: Elevated blood pressure   Elevated BP past few weeks. 170/90, 187/87, 166/89, then at Urgent care 188/83(1 week ago for clammy feeling). then 160/80 on recheck.  BP elevated at work prior.  Recommended continued monitoring of BP and PCP follow up.  No prior hx of HTN.  Alcohol - 1 glass wine per day (few per day ) Increased stress/anxiety - prozac 10mg  started 5/2 by her OBGYN provider Lindsey Sanford , NP). Has been recommended to start anxiety meds in past. Longstanding anxiety, stressful life prior to pandemic, but was balancing, sx's worse with Covid pandemic.  Also called therapist - Lindsey Sanford - saw 1 year ago, and 2 visits recently - appt next week.  Anxiety starting to improve this week. Feeling better, more like self, less heart palpitations (noted with elevated BP).  Difficulty sleeping, mind racing. Thinking about the day. Wakes up with thoughts of stress as well. No meds for sleep.  Walking for exercise.   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  Having to adjust to doing her job differently, worried about the virus.   Depression screen PHQ 2/9 09/05/2018  Decreased Interest 1  Down, Depressed, Hopeless 0  PHQ - 2 Score 1   No flowsheet data found.        BP Readings from Last 3 Encounters:  09/05/18 130/78  01/24/18  126/80  12/26/17 132/76   Lab Results  Component Value Date   CREATININE 0.82 01/24/2018     No orders of the defined types were placed in this encounter.  Patient Instructions       If you have lab work done today you will be contacted with your lab results within the next 2 weeks.  If you have not heard from [place] then please contact us. The fastest way to get your results is to register for My Chart.   IF you received an x-ray today, you will receive an invoice from [facility]. Please contact [facility] at [phone] with questions or concerns regarding your invoice.   IF you received labwork today, you will receive an invoice from [facility]. Please contact [facility] at [phone] with questions or concerns regarding your invoice.   Our billing staff will not be able to assist you with questions regarding bills from these companies.  You will be contacted with the lab results as soon as they are available. The fastest way to get your results is to activate your My Chart account. Instructions are located on the last page of this paperwork. If you have not heard from [place] regarding the results in 2 weeks, please contact this office.         . Patient Active Problem List   Diagnosis Date Noted  . Primary  osteoarthritis of both hands 02/22/2016  . Primary osteoarthritis of both feet 02/22/2016  . Fatigue 02/22/2016  . Osteochondroma of foot, right 02/22/2016  . Anxiety 07/15/2011   Past Medical History:  Diagnosis Date  . Anxiety   . Depression   . Mouth ulcers    most of your life/ due to stress  . Osteopenia    Past Surgical History:  Procedure Laterality Date  . [place]   right foot  . HERNIA REPAIR     s  . TONSILLECTOMY     68-29 years old   Allergies  Allergen Reactions  . Nitrofurantoin Monohyd Macro     Lip swelling   Prior to Admission medications   Medication Sig Start Date End Date Taking? Authorizing Provider   Calcium-Vitamin D (CALTRATE 600 PLUS-VIT D PO) Take by mouth.   Yes [provider]  FLUoxetine (PROZAC) 10 MG capsule Take 1 capsule (10 mg total) by mouth daily. 08/09/18  Yes Huel Cote, NP  Multiple Vitamin (MULTIVITAMIN) tablet Take 1 tablet by mouth daily.   Yes [provider]   Social History   Socioeconomic History  . Marital status: Married    Spouse name: Not on file  . Number of children: Not on file  . Years of education: Not on file  . Highest education level: Not on file  Occupational History  . Not on file  Social Needs  . Financial resource strain: Not on file  . Food insecurity:    Worry: Not on file    Inability: Not on file  . Transportation needs:    Medical: Not on file    Non-medical: Not on file  Tobacco Use  . Smoking status: Never Smoker  . Smokeless tobacco: Never Used  Substance and Sexual Activity  . Alcohol use: Yes    Alcohol/week: 4.0 standard drinks    Types: 4 Glasses of wine per week  . Drug use: No  . Sexual activity: Yes    Comment: INTERCOUSRE AGE 8, SEXUAL PARTNERS LESS THAN 5  Lifestyle  . Physical activity:    Days per week: Not on file    Minutes per session: Not on file  . Stress: Not on file  Relationships  . Social connections:    Talks on phone: Not on file    Gets together: Not on file    Attends religious service: Not on file    Active member of club or organization: Not on file    Attends meetings of clubs or organizations: Not on file    Relationship status: Not on file  . Intimate partner violence:    Fear of current or ex partner: Not on file    Emotionally abused: Not on file    Physically abused: Not on file    Forced sexual activity: Not on file  Other Topics Concern  . Not on file  Social History Narrative  . Not on file    Review of Systems Per hpi.     Objective:   Physical Exam Vitals signs reviewed.  Constitutional:      Appearance: She is well-developed.  HENT:      Head: Normocephalic and atraumatic.  Eyes:     Conjunctiva/sclera: Conjunctivae normal.     Pupils: Pupils are equal, round, and reactive to light.  Neck:     Vascular: No carotid bruit.  Cardiovascular:     Rate and Rhythm: Normal rate and regular rhythm.  Heart sounds: Normal heart sounds.  Pulmonary:     Effort: Pulmonary effort is normal.     Breath sounds: Normal breath sounds.  Abdominal:     Palpations: Abdomen is soft. There is no pulsatile mass.     Tenderness: There is no abdominal tenderness.  Skin:    General: Skin is warm and dry.  Neurological:     Mental Status: She is alert and oriented to person, place, and time.  Psychiatric:        Attention and Perception: Attention normal.        Mood and Affect: Mood is anxious.        Speech: Speech normal.        Behavior: Behavior normal.    Vitals:   09/05/18 1400 09/05/18 1404 09/05/18 1405  BP: (!) 173/78 (!) 170/80 130/78  Pulse: 87    Resp: 16    Temp: 98.7 F (37.1 C)    TempSrc: Oral    SpO2: 96%    Weight: 108 lb 3.2 oz (49.1 kg)    Height: 5' 7.5" (1.715 m)         Assessment & Plan:    SIYONA COTO is a 58 y.o. female Elevated blood pressure reading - Plan: Basic metabolic panel, TSH  Generalized anxiety disorder - Plan: hydrOXYzine (ATARAX/VISTARIL) 25 MG tablet  Situational stress  Insomnia, unspecified type - Plan: hydrOXYzine (ATARAX/VISTARIL) 25 MG tablet  Palpitations - Plan: Basic metabolic panel, TSH  Suspected generalized anxiety disorder with increased stressors.  Blood pressure improved with repeat testing.  -Continue fluoxetine, may need higher dose, can recheck next few weeks.  Agree with meeting with therapist, meditation and other stress relief techniques discussed with handout given.  Walking for exercise discussed.   -Hydroxyzine if needed for sleep, potential side effects discussed.  -Check TSH, other electrolytes, but if recurrence of palpitations, RTC precautions  given.  Can also monitor home blood pressures but handout given on appropriate technique.  RTC/ER precautions   Meds ordered this encounter  Medications  . hydrOXYzine (ATARAX/VISTARIL) 25 MG tablet    Sig: Take 0.5-1 tablets (12.5-25 mg total) by mouth at bedtime as needed for anxiety.    Dispense:  30 tablet    Refill:  0   Patient Instructions    Blood pressure looks okay here today.  I am glad to hear that some of her anxiety symptoms have improved with fluoxetine, but I expect that those should continue to improve medication and meeting with her therapist.  See information below on anxiety and stress management.  Continue meditation if that is helpful, and walking for exercise can also be helpful.  I will check some electrolytes, thyroid test but if the heart palpitations return or worsen, return for other testing.  No change in fluoxetine dose for now.  Hydroxyzine if needed at bedtime.  See information below on insomnia.  Follow-up in 3 weeks, sooner if needed.    Generalized Anxiety Disorder, Adult Generalized anxiety disorder (GAD) is a mental health disorder. People with this condition constantly worry about everyday events. Unlike normal anxiety, worry related to GAD is not triggered by a specific event. These worries also do not fade or get better with time. GAD interferes with life functions, including relationships, work, and school. GAD can vary from mild to severe. People with severe GAD can have intense waves of anxiety with physical symptoms (panic attacks). What are the causes? The exact cause of GAD is not known. What  increases the risk? This condition is more likely to develop in:  Women.  People who have a family history of anxiety disorders.  People who are very shy.  People who experience very stressful life events, such as the death of a loved one.  People who have a very stressful family environment. What are the signs or symptoms? People with GAD often  worry excessively about many things in their lives, such as their health and family. They may also be overly concerned about:  Doing well at work.  Being on time.  Natural disasters.  Friendships. Physical symptoms of GAD include:  Fatigue.  Muscle tension or having muscle twitches.  Trembling or feeling shaky.  Being easily startled.  Feeling like your heart is pounding or racing.  Feeling out of breath or like you cannot take a deep breath.  Having trouble falling asleep or staying asleep.  Sweating.  Nausea, diarrhea, or irritable bowel syndrome (IBS).  Headaches.  Trouble concentrating or remembering facts.  Restlessness.  Irritability. How is this diagnosed? Your health care provider can diagnose GAD based on your symptoms and medical history. You will also have a physical exam. The health care provider will ask specific questions about your symptoms, including how severe they are, when they started, and if they come and go. Your health care provider may ask you about your use of alcohol or drugs, including prescription medicines. Your health care provider may refer you to a mental health specialist for further evaluation. Your health care provider will do a thorough examination and may perform additional tests to rule out other possible causes of your symptoms. To be diagnosed with GAD, a person must have anxiety that:  Is out of his or her control.  Affects several different aspects of his or her life, such as work and relationships.  Causes distress that makes him or her unable to take part in normal activities.  Includes at least three physical symptoms of GAD, such as restlessness, fatigue, trouble concentrating, irritability, muscle tension, or sleep problems. Before your health care provider can confirm a diagnosis of GAD, these symptoms must be present more days than they are not, and they must last for six months or longer. How is this treated? The  following therapies are usually used to treat GAD:  Medicine. Antidepressant medicine is usually prescribed for long-term daily control. Antianxiety medicines may be added in severe cases, especially when panic attacks occur.  Talk therapy (psychotherapy). Certain types of talk therapy can be helpful in treating GAD by providing support, education, and guidance. Options include: ? Cognitive behavioral therapy (CBT). People learn coping skills and techniques to ease their anxiety. They learn to identify unrealistic or negative thoughts and behaviors and to replace them with positive ones. ? Acceptance and commitment therapy (ACT). This treatment teaches people how to be mindful as a way to cope with unwanted thoughts and feelings. ? Biofeedback. This process trains you to manage your body's response (physiological response) through breathing techniques and relaxation methods. You will work with a therapist while machines are used to monitor your physical symptoms.  Stress management techniques. These include yoga, meditation, and exercise. A mental health specialist can help determine which treatment is best for you. Some people see improvement with one type of therapy. However, other people require a combination of therapies. Follow these instructions at home:  Take over-the-counter and prescription medicines only as told by your health care provider.  Try to maintain a normal routine.  Try  to anticipate stressful situations and allow extra time to manage them.  Practice any stress management or self-calming techniques as taught by your health care provider.  Do not punish yourself for setbacks or for not making progress.  Try to recognize your accomplishments, even if they are small.  Keep all follow-up visits as told by your health care provider. This is important. Contact a health care provider if:  Your symptoms do not get better.  Your symptoms get worse.  You have signs of  depression, such as: ? A persistently sad, cranky, or irritable mood. ? Loss of enjoyment in activities that used to bring you joy. ? Change in weight or eating. ? Changes in sleeping habits. ? Avoiding friends or family members. ? Loss of energy for normal tasks. ? Feelings of guilt or worthlessness. Get help right away if:  You have serious thoughts about hurting yourself or others. If you ever feel like you may hurt yourself or others, or have thoughts about taking your own life, get help right away. You can go to your nearest emergency department or call:  Your local emergency services (911 in the [place]  A suicide crisis helpline, such as the [facility] at 517-868-0871. This is open 24 hours a day. Summary  Generalized anxiety disorder (GAD) is a mental health disorder that involves worry that is not triggered by a specific event.  People with GAD often worry excessively about many things in their lives, such as their health and family.  GAD may cause physical symptoms such as restlessness, trouble concentrating, sleep problems, frequent sweating, nausea, diarrhea, headaches, and trembling or muscle twitching.  A mental health specialist can help determine which treatment is best for you. Some people see improvement with one type of therapy. However, other people require a combination of therapies. This information is not intended to replace advice given to you by your health care provider. Make sure you discuss any questions you have with your health care provider. Document Released: 07/23/2012 Document Revised: 02/16/2016 Document Reviewed: 02/16/2016 Elsevier Interactive Patient Education  2019 [employer].  How to Take Your Blood Pressure You can take your blood pressure at home with a machine. You may need to check your blood pressure at home:  To check if you have high blood pressure (hypertension).  To check your blood pressure over time.   To make sure your blood pressure medicine is working. Supplies needed: You will need a blood pressure machine, or monitor. You can buy one at a drugstore or online. When choosing one:  Choose one with an arm cuff.  Choose one that wraps around your upper arm. Only one finger should fit between your arm and the cuff.  Do not choose one that measures your blood pressure from your wrist or finger. Your doctor can suggest a monitor. How to prepare Avoid these things for 30 minutes before checking your blood pressure:  Drinking caffeine.  Drinking alcohol.  Eating.  Smoking.  Exercising. Five minutes before checking your blood pressure:  Pee.  Sit in a dining chair. Avoid sitting in a soft couch or armchair.  Be quiet. Do not talk. How to take your blood pressure Follow the instructions that came with your machine. If you have a digital blood pressure monitor, these may be the instructions: 1. Sit up straight. 2. Place your feet on the floor. Do not cross your ankles or legs. 3. Rest your left arm at the level of your heart. You  may rest it on a table, desk, or chair. 4. Pull up your shirt sleeve. 5. Wrap the blood pressure cuff around the upper part of your left arm. The cuff should be 1 inch (2.5 cm) above your elbow. It is best to wrap the cuff around bare skin. 6. Fit the cuff snugly around your arm. You should be able to place only one finger between the cuff and your arm. 7. Put the cord inside the groove of your elbow. 8. Press the power button. 9. Sit quietly while the cuff fills with air and loses air. 10. Write down the numbers on the screen. 11. Wait 2-3 minutes and then repeat steps 1-10. What do the numbers mean? Two numbers make up your blood pressure. The first number is called systolic pressure. The second is called diastolic pressure. An example of a blood pressure reading is "120 over 80" (or 120/80). If you are an adult and do not have a medical condition,  use this guide to find out if your blood pressure is normal: Normal  First number: below 120.  Second number: below 80. Elevated  First number: 120-129.  Second number: below 80. Hypertension stage 1  First number: 130-139.  Second number: 80-89. Hypertension stage 2  First number: 140 or above.  Second number: 11 or above. Your blood pressure is above normal even if only the top or bottom number is above normal. Follow these instructions at home:  Check your blood pressure as often as your doctor tells you to.  Take your monitor to your next doctor's appointment. Your doctor will: ? Make sure you are using it correctly. ? Make sure it is working right.  Make sure you understand what your blood pressure numbers should be.  Tell your doctor if your medicines are causing side effects. Contact a doctor if:  Your blood pressure keeps being high. Get help right away if:  Your first blood pressure number is higher than 180.  Your second blood pressure number is higher than 120. This information is not intended to replace advice given to you by your health care provider. Make sure you discuss any questions you have with your health care provider. Document Released: 03/10/2008 Document Revised: 02/24/2016 Document Reviewed: 09/04/2015 Elsevier Interactive Patient Education  2019 [employer].  Insomnia Insomnia is a sleep disorder that makes it difficult to fall asleep or stay asleep. Insomnia can cause fatigue, low energy, difficulty concentrating, mood swings, and poor performance at work or school. There are three different ways to classify insomnia:  Difficulty falling asleep.  Difficulty staying asleep.  Waking up too early in the morning. Any type of insomnia can be long-term (chronic) or short-term (acute). Both are common. Short-term insomnia usually lasts for three months or less. Chronic insomnia occurs at least three times a week for longer than three months.  What are the causes? Insomnia may be caused by another condition, situation, or substance, such as:  Anxiety.  Certain medicines.  Gastroesophageal reflux disease (GERD) or other gastrointestinal conditions.  Asthma or other breathing conditions.  Restless legs syndrome, sleep apnea, or other sleep disorders.  Chronic pain.  Menopause.  Stroke.  Abuse of alcohol, tobacco, or illegal drugs.  Mental health conditions, such as depression.  Caffeine.  Neurological disorders, such as Alzheimer's disease.  An overactive thyroid (hyperthyroidism). Sometimes, the cause of insomnia may not be known. What increases the risk? Risk factors for insomnia include:  Gender. Women are affected more often than men.  Age. Insomnia is more common as you get older.  Stress.  Lack of exercise.  Irregular work schedule or working night shifts.  Traveling between different time zones.  Certain medical and mental health conditions. What are the signs or symptoms? If you have insomnia, the main symptom is having trouble falling asleep or having trouble staying asleep. This may lead to other symptoms, such as:  Feeling fatigued or having low energy.  Feeling nervous about going to sleep.  Not feeling rested in the morning.  Having trouble concentrating.  Feeling irritable, anxious, or depressed. How is this diagnosed? This condition may be diagnosed based on:  Your symptoms and medical history. Your health care provider may ask about: ? Your sleep habits. ? Any medical conditions you have. ? Your mental health.  A physical exam. How is this treated? Treatment for insomnia depends on the cause. Treatment may focus on treating an underlying condition that is causing insomnia. Treatment may also include:  Medicines to help you sleep.  Counseling or therapy.  Lifestyle adjustments to help you sleep better. Follow these instructions at home: Eating and drinking   Limit  or avoid alcohol, caffeinated beverages, and cigarettes, especially close to bedtime. These can disrupt your sleep.  Do not eat a large meal or eat spicy foods right before bedtime. This can lead to digestive discomfort that can make it hard for you to sleep. Sleep habits   Keep a sleep diary to help you and your health care provider figure out what could be causing your insomnia. Write down: ? When you sleep. ? When you wake up during the night. ? How well you sleep. ? How rested you feel the next day. ? Any side effects of medicines you are taking. ? What you eat and drink.  Make your bedroom a dark, comfortable place where it is easy to fall asleep. ? Put up shades or blackout curtains to block light from outside. ? Use a white noise machine to block noise. ? Keep the temperature cool.  Limit screen use before bedtime. This includes: ? Watching TV. ? Using your smartphone, tablet, or computer.  Stick to a routine that includes going to bed and waking up at the same times every day and night. This can help you fall asleep faster. Consider making a quiet activity, such as reading, part of your nighttime routine.  Try to avoid taking naps during the day so that you sleep better at night.  Get out of bed if you are still awake after 15 minutes of trying to sleep. Keep the lights down, but try reading or doing a quiet activity. When you feel sleepy, go back to bed. General instructions  Take over-the-counter and prescription medicines only as told by your health care provider.  Exercise regularly, as told by your health care provider. Avoid exercise starting several hours before bedtime.  Use relaxation techniques to manage stress. Ask your health care provider to suggest some techniques that may work well for you. These may include: ? Breathing exercises. ? Routines to release muscle tension. ? Visualizing peaceful scenes.  Make sure that you drive carefully. Avoid driving if  you feel very sleepy.  Keep all follow-up visits as told by your health care provider. This is important. Contact a health care provider if:  You are tired throughout the day.  You have trouble in your daily routine due to sleepiness.  You continue to have sleep problems, or your sleep problems get worse. Get  help right away if:  You have serious thoughts about hurting yourself or someone else. If you ever feel like you may hurt yourself or others, or have thoughts about taking your own life, get help right away. You can go to your nearest emergency department or call:  Your local emergency services (911 in the [place]  A suicide crisis helpline, such as the [facility] at 604 074 6324. This is open 24 hours a day. Summary  Insomnia is a sleep disorder that makes it difficult to fall asleep or stay asleep.  Insomnia can be long-term (chronic) or short-term (acute).  Treatment for insomnia depends on the cause. Treatment may focus on treating an underlying condition that is causing insomnia.  Keep a sleep diary to help you and your health care provider figure out what could be causing your insomnia. This information is not intended to replace advice given to you by your health care provider. Make sure you discuss any questions you have with your health care provider. Document Released: 03/25/2000 Document Revised: 01/05/2017 Document Reviewed: 01/05/2017 Elsevier Interactive Patient Education  [employer].   If you have lab work done today you will be contacted with your lab results within the next 2 weeks.  If you have not heard from [place] then please contact us. The fastest way to get your results is to register for My Chart.   IF you received an x-ray today, you will receive an invoice from [facility]. Please contact [facility] at [phone] with questions or concerns regarding your invoice.   IF you received labwork today,  you will receive an invoice from [place]. Please contact [facility] at [phone] with questions or concerns regarding your invoice.   Our billing staff will not be able to assist you with questions regarding bills from these companies.  You will be contacted with the lab results as soon as they are available. The fastest way to get your results is to activate your My Chart account. Instructions are located on the last page of this paperwork. If you have not heard from [place] regarding the results in 2 weeks, please contact this office.       Signed,   Merri Ray, MD [facility] [place].  09/09/18 10:57 PM

## 2018-09-06 LAB — BASIC METABOLIC PANEL
BUN/Creatinine Ratio: 16 (ref 9–23)
BUN: 14 mg/dL (ref 6–24)
CO2: 20 mmol/L (ref 20–29)
Calcium: 9 mg/dL (ref 8.7–10.2)
Chloride: 98 mmol/L (ref 96–106)
Creatinine, Ser: 0.86 mg/dL (ref 0.57–1.00)
GFR calc Af Amer: 87 mL/min/{1.73_m2} (ref >59)
GFR calc non Af Amer: 75 mL/min/{1.73_m2} (ref >59)
Glucose: 86 mg/dL (ref 65–99)
Potassium: 4 mmol/L (ref 3.5–5.2)
Sodium: 135 mmol/L (ref 134–144)

## 2018-09-06 LAB — TSH: TSH: 1.13 u[IU]/mL (ref 0.450–4.500)

## 2018-09-07 NOTE — Telephone Encounter (Signed)
Patient said she no longer needs to Xanax saw PCP and was prescribed something less addictive. Rx was not called in.

## 2018-09-09 ENCOUNTER — Encounter: Payer: Self-pay | Admitting: Family Medicine

## 2018-09-11 ENCOUNTER — Ambulatory Visit (INDEPENDENT_AMBULATORY_CARE_PROVIDER_SITE_OTHER): Payer: PRIVATE HEALTH INSURANCE | Admitting: Licensed Clinical Social Worker

## 2018-09-11 DIAGNOSIS — F419 Anxiety disorder, unspecified: Secondary | ICD-10-CM

## 2018-09-26 ENCOUNTER — Encounter: Payer: Self-pay | Admitting: Family Medicine

## 2018-09-27 ENCOUNTER — Other Ambulatory Visit: Payer: Self-pay | Admitting: Family Medicine

## 2018-09-27 DIAGNOSIS — G4700 Insomnia, unspecified: Secondary | ICD-10-CM

## 2018-09-27 DIAGNOSIS — F411 Generalized anxiety disorder: Secondary | ICD-10-CM

## 2018-09-27 NOTE — Telephone Encounter (Signed)
Patient was seen by you on 09/05/18 and hydroxyzine was filled for a 30 day supply. Is it ok top change this medication to a 90 day supply per patient request.

## 2018-09-27 NOTE — Telephone Encounter (Signed)
Dr. Carlota Raspberry sent in #[phone] in May pt is requesting #90

## 2018-09-28 NOTE — Telephone Encounter (Signed)
Done

## 2018-10-02 ENCOUNTER — Other Ambulatory Visit: Payer: Self-pay

## 2018-10-02 ENCOUNTER — Telehealth: Payer: Self-pay | Admitting: Family Medicine

## 2018-10-02 ENCOUNTER — Telehealth (INDEPENDENT_AMBULATORY_CARE_PROVIDER_SITE_OTHER): Payer: PRIVATE HEALTH INSURANCE | Admitting: Family Medicine

## 2018-10-02 DIAGNOSIS — F5104 Psychophysiologic insomnia: Secondary | ICD-10-CM

## 2018-10-02 DIAGNOSIS — F439 Reaction to severe stress, unspecified: Secondary | ICD-10-CM

## 2018-10-02 DIAGNOSIS — F411 Generalized anxiety disorder: Secondary | ICD-10-CM

## 2018-10-02 NOTE — Progress Notes (Signed)
Virtual Visit via Video Note  I connected with Lindsey Sanford on 10/02/18 at 8:59 AM by a video enabled telemedicine application and verified that I am speaking with the correct person using two identifiers.   I discussed the limitations, risks, security and privacy concerns of performing an evaluation and management service by telephone and the availability of in person appointments. I also discussed with the patient that there may be a patient responsible charge related to this service. The patient expressed understanding and agreed to proceed, consent obtained  Chief complaint: Anxiety/sleep.    History of Present Illness: Lindsey Sanford is a 58 y.o. female   Anxiety:  Follow-up from May 27.  Suspected generalized anxiety disorder with increased recent stressors.  Had some concerns regarding elevated blood pressure but that improved with repeat testing.  She was continued on fluoxetine and had been started by her OB/GYN.  Planned on meeting with therapist, discussed walking for exercise and other stress management techniques.  Hydroxyzine 25mg   given for sleep/anxiety as needed.   TSH, electrolytes were normal with history of palpitations.  Doing much better. No more heart racing or heartburn. Not as fearful.  Physical experience is better - noticed improvement at 4 weeks. Able to laugh, able to think better. Still taking fluoxetine same dose - 10mg .  10k steps per day at work. Still meeting with counselor -appt tomorrow.   Hydroxyzine - 1/2 pill for sleep - not every night. 2-4 nts per week. Waking up in middle of night if not taking. No hangover effect/daytime sedation taking med.   Home BP readings: (using approach from handout).  140/71, 132/71.   130-140/75-85.  No further physical sx's as above. Has DOT physical in next few weeks.    Depression screen [facility] 2/9 10/02/2018 09/05/2018  Decreased Interest 0 1  Down, Depressed, Hopeless - 0  PHQ - 2 Score 0 1   GAD 7 score of 1.     Patient Active Problem List   Diagnosis Date Noted  . Primary osteoarthritis of both hands 02/22/2016  . Primary osteoarthritis of both feet 02/22/2016  . Fatigue 02/22/2016  . Osteochondroma of foot, right 02/22/2016  . Anxiety 07/15/2011   Past Medical History:  Diagnosis Date  . Anxiety   . Depression   . Mouth ulcers    most of your life/ due to stress  . Osteopenia    Past Surgical History:  Procedure Laterality Date  . [place]   right foot  . HERNIA REPAIR     s  . TONSILLECTOMY     72-43 years old   Allergies  Allergen Reactions  . Nitrofurantoin Monohyd Macro     Lip swelling   Prior to Admission medications   Medication Sig Start Date End Date Taking? Authorizing Provider  Calcium-Vitamin D (CALTRATE 600 PLUS-VIT D PO) Take by mouth.   Yes [provider]  FLUoxetine (PROZAC) 10 MG capsule Take 1 capsule (10 mg total) by mouth daily. 08/09/18  Yes Huel Cote, NP  hydrOXYzine (ATARAX/VISTARIL) 25 MG tablet TAKE 0.5-1 TABLETS (12.5-25 MG TOTAL) BY MOUTH AT BEDTIME AS NEEDED FOR ANXIETY. 09/28/18  Yes Wendie Agreste, MD  Multiple Vitamin (MULTIVITAMIN) tablet Take 1 tablet by mouth daily.   Yes [provider]   Social History   Socioeconomic History  . Marital status: Married    Spouse name: Not on file  . Number of children: Not on file  . Years of education:  Not on file  . Highest education level: Not on file  Occupational History  . Not on file  Social Needs  . Financial resource strain: Not on file  . Food insecurity    Worry: Not on file    Inability: Not on file  . Transportation needs    Medical: Not on file    Non-medical: Not on file  Tobacco Use  . Smoking status: Never Smoker  . Smokeless tobacco: Never Used  Substance and Sexual Activity  . Alcohol use: Yes    Alcohol/week: 4.0 standard drinks    Types: 4 Glasses of wine per week  . Drug use: No  . Sexual activity: Yes    Comment: INTERCOUSRE AGE  53, SEXUAL PARTNERS LESS THAN 5  Lifestyle  . Physical activity    Days per week: Not on file    Minutes per session: Not on file  . Stress: Not on file  Relationships  . Social Herbalist on phone: Not on file    Gets together: Not on file    Attends religious service: Not on file    Active member of club or organization: Not on file    Attends meetings of clubs or organizations: Not on file    Relationship status: Not on file  . Intimate partner violence    Fear of current or ex partner: Not on file    Emotionally abused: Not on file    Physically abused: Not on file    Forced sexual activity: Not on file  Other Topics Concern  . Not on file  Social History Narrative  . Not on file    Observations/Objective: Normal speech, no distress, good eye contact, euthymic mood.  No SI/HI.  All questions answered with understanding expressed.  Assessment and Plan: GAD (generalized anxiety disorder) - Plan:  Situational stress - Plan:  Psychophysiological insomnia - Plan:   -Improving with fluoxetine, decided to remain at same dose.  Also suspected therapy is helping as well as improve sleep and stress management techniques.  Option of hydroxyzine nightly if needed as tolerating.  -Recheck 6 weeks  -Borderline blood pressures, but at current levels would not recommend starting medication.  If elevated at DOT physical or persistent elevations over 140 consider low-dose medication.   Follow Up Instructions: 6 weeks.    I discussed the assessment and treatment plan with the patient. The patient was provided an opportunity to ask questions and all were answered. The patient agreed with the plan and demonstrated an understanding of the instructions.   The patient was advised to call back or seek an in-person evaluation if the symptoms worsen or if the condition fails to improve as anticipated.  I provided 13  minutes of non-face-to-face time during this encounter.   Wendie Agreste, MD

## 2018-10-02 NOTE — Patient Instructions (Addendum)
Good talking to you today. I am glad to hear that the anxiety symptoms are improving.  I think continuing the same dose of fluoxetine is reasonable for now, and hydroxyzine up to every night if needed.  Continue follow-up with therapist.  Recheck with me in 6 weeks, but let me know if there are questions sooner.  For blood pressure, as long as it remains below 140 most of the time, I do not think we need to start a blood pressure medication for now.  If it remains over 140, we can consider medication sooner.  Otherwise recheck in 6 weeks. Let me know if there are questions during that time.    If you have lab work done today you will be contacted with your lab results within the next 2 weeks.  If you have not heard from [place] then please contact us. The fastest way to get your results is to register for My Chart.   IF you received an x-ray today, you will receive an invoice from [facility]. Please contact [facility] at [phone] with questions or concerns regarding your invoice.   IF you received labwork today, you will receive an invoice from [place]. Please contact [facility] at [phone] with questions or concerns regarding your invoice.   Our billing staff will not be able to assist you with questions regarding bills from these companies.  You will be contacted with the lab results as soon as they are available. The fastest way to get your results is to activate your My Chart account. Instructions are located on the last page of this paperwork. If you have not heard from [place] regarding the results in 2 weeks, please contact this office.

## 2018-10-02 NOTE — Telephone Encounter (Addendum)
10/02/2018 - PATIENT HAD A DOXY.ME VIRTUAL VISIT WITH DR. Carlota Raspberry ON Tuesday (10/02/2018). DR. Carlota Raspberry HAS REQUESTED SHE FOLLOW-UP WITH HIM IN 6 MONTHS (DEC. 2020) FOR ANXIETY, SLEEP AND BLOOD PRESSURE. I TRIED TO CALL AND SCHEDULE BUT HAD TO LEAVE HER A VOICE MAIL TO RETURN OUR CALL. Lindsey Sanford

## 2018-10-02 NOTE — Progress Notes (Signed)
CC-3 wk f/u anxiety, BP, Sleep- patient states her anxiety was much better(GAD7=1, Blood pressure has been running in the range of 130-140 over 75-85, Sleep is fair. Not a 100% but better

## 2018-10-03 ENCOUNTER — Ambulatory Visit (INDEPENDENT_AMBULATORY_CARE_PROVIDER_SITE_OTHER): Payer: PRIVATE HEALTH INSURANCE | Admitting: Licensed Clinical Social Worker

## 2018-10-03 DIAGNOSIS — F419 Anxiety disorder, unspecified: Secondary | ICD-10-CM | POA: Diagnosis not present

## 2018-10-18 ENCOUNTER — Ambulatory Visit (INDEPENDENT_AMBULATORY_CARE_PROVIDER_SITE_OTHER): Payer: PRIVATE HEALTH INSURANCE | Admitting: Licensed Clinical Social Worker

## 2018-10-18 DIAGNOSIS — F419 Anxiety disorder, unspecified: Secondary | ICD-10-CM | POA: Diagnosis not present

## 2018-11-01 ENCOUNTER — Encounter: Payer: Self-pay | Admitting: Obstetrics & Gynecology

## 2018-11-01 ENCOUNTER — Ambulatory Visit: Payer: PRIVATE HEALTH INSURANCE | Admitting: Obstetrics & Gynecology

## 2018-11-01 ENCOUNTER — Other Ambulatory Visit: Payer: Self-pay

## 2018-11-01 VITALS — BP 146/88

## 2018-11-01 DIAGNOSIS — R350 Frequency of micturition: Secondary | ICD-10-CM

## 2018-11-01 DIAGNOSIS — R3915 Urgency of urination: Secondary | ICD-10-CM

## 2018-11-01 DIAGNOSIS — R300 Dysuria: Secondary | ICD-10-CM

## 2018-11-01 MED ORDER — SULFAMETHOXAZOLE-TRIMETHOPRIM 800-160 MG PO TABS: 1.0000 | ORAL_TABLET | Freq: Two times a day (BID) | ORAL | 0 refills | Status: AC

## 2018-11-01 NOTE — Progress Notes (Signed)
    Lindsey Sanford [birth year] [id number]        58 y.o.  G3P3L3 Married  RP: Dysuria, urgency and frequency of urination x 2 days  HPI: Dysuria, urgency and frequency of urination x 2 days.  No vaginal discharge.  No back pain.  No blood in urine.  No fever.     OB History  Gravida Para Term Preterm AB Living  3 3       3   SAB TAB Ectopic Multiple Live Births               # Outcome Date GA Lbr Len/2nd Weight Sex Delivery Anes PTL Lv  3 Para           2 Para           1 Para             Past medical history,surgical history, problem list, medications, allergies, family history and social history were all reviewed and documented in the EPIC chart.   Directed ROS with pertinent positives and negatives documented in the history of present illness/assessment and plan.  Exam:  Vitals:   11/01/18 0928  BP: (!) 146/88   General appearance:  Normal  Abdomen: Normal  CVAT negative bilaterally  Gynecologic exam: Deferred  U/A: Yellow clear, protein negative, nitrite negative, white blood cells 10-20, red blood cells negative, few bacteria.  Urine culture pending.   Assessment/Plan:  58 y.o. G3P3   1. Dysuria Probable acute cystitis per clinical presentation and urine analysis.  Decision to treat with Bactrim DS.  No allergy.  Usage reviewed and prescription sent to pharmacy.  Urine culture pending. - Urinalysis,Complete w/RFL Culture  2. Urgency of micturition - Urinalysis,Complete w/RFL Culture  3. Frequency of urination - Urinalysis,Complete w/RFL Culture  Other orders - sulfamethoxazole-trimethoprim (BACTRIM DS) 800-160 MG tablet; Take 1 tablet by mouth 2 (two) times daily for 3 days.  Counseling on above issues and coordination of care more than 50% for 15 minutes.  Princess Bruins MD, 9:46 AM 11/01/2018

## 2018-11-02 ENCOUNTER — Encounter: Payer: Self-pay | Admitting: Obstetrics & Gynecology

## 2018-11-02 NOTE — Patient Instructions (Signed)
1. Dysuria Probable acute cystitis per clinical presentation and urine analysis.  Decision to treat with Bactrim DS.  No allergy.  Usage reviewed and prescription sent to pharmacy.  Urine culture pending. - Urinalysis,Complete w/RFL Culture  2. Urgency of micturition - Urinalysis,Complete w/RFL Culture  3. Frequency of urination - Urinalysis,Complete w/RFL Culture  Other orders - sulfamethoxazole-trimethoprim (BACTRIM DS) 800-160 MG tablet; Take 1 tablet by mouth 2 (two) times daily for 3 days.  [place]and[place], it was a pleasure meeting you today!  I will inform you of your results as soon as they are available.

## 2018-11-03 LAB — URINALYSIS, COMPLETE W/RFL CULTURE
Bilirubin Urine: NEGATIVE
Hgb urine dipstick: NEGATIVE
Hyaline Cast: NONE SEEN /LPF
Ketones, ur: NEGATIVE
Nitrites, Initial: NEGATIVE
Protein, ur: NEGATIVE
RBC / HPF: NONE SEEN /HPF (ref 0–2)
Specific Gravity, Urine: 1.004 (ref 1.001–1.03)
pH: 6 (ref 5.0–8.0)

## 2018-11-03 LAB — URINE CULTURE
MICRO NUMBER:: 699066
SPECIMEN QUALITY:: ADEQUATE

## 2018-11-03 LAB — CULTURE INDICATED

## 2018-11-07 ENCOUNTER — Ambulatory Visit (INDEPENDENT_AMBULATORY_CARE_PROVIDER_SITE_OTHER): Payer: PRIVATE HEALTH INSURANCE | Admitting: Licensed Clinical Social Worker

## 2018-11-07 DIAGNOSIS — F419 Anxiety disorder, unspecified: Secondary | ICD-10-CM | POA: Diagnosis not present

## 2018-11-14 ENCOUNTER — Encounter: Payer: Self-pay | Admitting: Women's Health

## 2018-11-14 ENCOUNTER — Other Ambulatory Visit: Payer: Self-pay | Admitting: Women's Health

## 2018-11-14 MED ORDER — FLUOXETINE HCL 10 MG PO CAPS: 10.0000 mg | ORAL_CAPSULE | Freq: Every day | ORAL | 2 refills | Status: DC

## 2018-11-15 ENCOUNTER — Other Ambulatory Visit: Payer: Self-pay

## 2018-11-15 ENCOUNTER — Telehealth (INDEPENDENT_AMBULATORY_CARE_PROVIDER_SITE_OTHER): Payer: PRIVATE HEALTH INSURANCE | Admitting: Family Medicine

## 2018-11-15 DIAGNOSIS — G4700 Insomnia, unspecified: Secondary | ICD-10-CM | POA: Diagnosis not present

## 2018-11-15 DIAGNOSIS — F411 Generalized anxiety disorder: Secondary | ICD-10-CM | POA: Diagnosis not present

## 2018-11-15 NOTE — Progress Notes (Signed)
Virtual Visit via Telephone Note  I connected with Lindsey Sanford on 11/15/18 at 8:32 AM by telephone and verified that I am speaking with the correct person using two identifiers.   I discussed the limitations, risks, security and privacy concerns of performing an evaluation and management service by telephone and the availability of in person appointments. I also discussed with the patient that there may be a patient responsible charge related to this service. The patient expressed understanding and agreed to proceed, consent obtained  Chief complaint: Anxiety.sleep.   History of Present Illness: Lindsey Sanford is a 58 y.o. female   Generalized anxiety disorder: See previous visit, last telemedicine visit June 23.  Prior increase stressors.  Improving with fluoxetine at that time, hydroxyzine as needed for sleep, and continue with counselor.  Was only requiring hydroxyzine 1/2 pill a few nights per week.  Continue same dose fluoxetine and hydroxyzine if needed.   Overall feels pretty good. It is a process. Notes worse symptoms if tired. Learning to manage this and followed by counselor - [name].  No new side effects on prozac. Still using hydroxyzine few nights per week.  Alcohol: 1-2 glasses wine per night. Total per week- 8-10.   BP 126/70 on recheck after slight elevation at DOT physical.   No flowsheet data found. GAD 7: score of 8.  Depression screen [facility] 2/9 11/15/2018 10/02/2018 09/05/2018  Decreased Interest 1 0 1  Down, Depressed, Hopeless 1 - 0  PHQ - 2 Score 2 0 1  Altered sleeping 2 - -  Change in appetite 1 - -  Feeling bad or failure about yourself  1 - -  Trouble concentrating 1 - -  Moving slowly or fidgety/restless 0 - -  Suicidal thoughts 0 - -  PHQ-9 Score 7 - -  Difficult doing work/chores Not difficult at all - -       Patient Active Problem List   Diagnosis Date Noted  . Primary osteoarthritis of both hands 02/22/2016  . Primary  osteoarthritis of both feet 02/22/2016  . Fatigue 02/22/2016  . Osteochondroma of foot, right 02/22/2016  . Anxiety 07/15/2011   Past Medical History:  Diagnosis Date  . Anxiety   . Depression   . Mouth ulcers    most of your life/ due to stress  . Osteopenia    Past Surgical History:  Procedure Laterality Date  . [place]   right foot  . HERNIA REPAIR     s  . TONSILLECTOMY     45-29 years old   Allergies  Allergen Reactions  . Nitrofurantoin Monohyd Macro     Lip swelling   Prior to Admission medications   Medication Sig Start Date End Date Taking? Authorizing Provider  Calcium-Vitamin D (CALTRATE 600 PLUS-VIT D PO) Take by mouth.   Yes [provider]  FLUoxetine (PROZAC) 10 MG capsule Take 1 capsule (10 mg total) by mouth daily. 11/14/18  Yes Huel Cote, NP  hydrOXYzine (ATARAX/VISTARIL) 25 MG tablet TAKE 0.5-1 TABLETS (12.5-25 MG TOTAL) BY MOUTH AT BEDTIME AS NEEDED FOR ANXIETY. 09/28/18  Yes Wendie Agreste, MD  Multiple Vitamin (MULTIVITAMIN) tablet Take 1 tablet by mouth daily.   Yes [provider]   Social History   Socioeconomic History  . Marital status: Married    Spouse name: Not on file  . Number of children: Not on file  . Years of education: Not on file  . Highest education  level: Not on file  Occupational History  . Not on file  Social Needs  . Financial resource strain: Not on file  . Food insecurity    Worry: Not on file    Inability: Not on file  . Transportation needs    Medical: Not on file    Non-medical: Not on file  Tobacco Use  . Smoking status: Never Smoker  . Smokeless tobacco: Never Used  Substance and Sexual Activity  . Alcohol use: Yes    Alcohol/week: 4.0 standard drinks    Types: 4 Glasses of wine per week  . Drug use: No  . Sexual activity: Yes    Comment: INTERCOUSRE AGE 43, SEXUAL PARTNERS LESS THAN 5  Lifestyle  . Physical activity    Days per week: Not on file    Minutes per session:  Not on file  . Stress: Not on file  Relationships  . Social Herbalist on phone: Not on file    Gets together: Not on file    Attends religious service: Not on file    Active member of club or organization: Not on file    Attends meetings of clubs or organizations: Not on file    Relationship status: Not on file  . Intimate partner violence    Fear of current or ex partner: Not on file    Emotionally abused: Not on file    Physically abused: Not on file    Forced sexual activity: Not on file  Other Topics Concern  . Not on file  Social History Narrative  . Not on file     Observations/Objective: There were no vitals filed for this visit. No distress, appropriate responses. Euthymic mood.   Assessment and Plan: GAD (generalized anxiety disorder) - Plan:  Insomnia, unspecified type - Plan:   -Overall stable.  Continue same dose fluoxetine, but option of higher dose if needed for increased anxiety symptoms, continue counseling, and hydroxyzine has  been effective for sleep.   - Reported episodic itchy eyes, over-the-counter options such as Pataday or Zaditor discussed with RTC precautions.    -Recommended avoiding more than 1 alcoholic drink per night.  Recheck 6 months, sooner if needed.   Follow Up Instructions:  6 months.    Patient Instructions       If you have lab work done today you will be contacted with your lab results within the next 2 weeks.  If you have not heard from [place] then please contact us. The fastest way to get your results is to register for My Chart.   IF you received an x-ray today, you will receive an invoice from [facility]. Please contact [facility] at [phone] with questions or concerns regarding your invoice.   IF you received labwork today, you will receive an invoice from [place]. Please contact [facility] at [phone] with questions or concerns regarding your invoice.   Our billing staff will not be able  to assist you with questions regarding bills from these companies.  You will be contacted with the lab results as soon as they are available. The fastest way to get your results is to activate your My Chart account. Instructions are located on the last page of this paperwork. If you have not heard from [place] regarding the results in 2 weeks, please contact this office.     Glad to hear your symptoms are doing well.  Continue same dose of fluoxetine, hydroxyzine if needed at night.  Try to avoid more than 1 alcoholic drink per day at the most.  Follow-up in 6 months unless any worsening of symptoms sooner.    I discussed the assessment and treatment plan with the patient. The patient was provided an opportunity to ask questions and all were answered. The patient agreed with the plan and demonstrated an understanding of the instructions.   The patient was advised to call back or seek an in-person evaluation if the symptoms worsen or if the condition fails to improve as anticipated.  I provided 10 minutes of non-face-to-face time during this encounter.  Signed,   Merri Ray, MD [facility] [place].  11/15/18

## 2018-11-15 NOTE — Patient Instructions (Addendum)
     If you have lab work done today you will be contacted with your lab results within the next 2 weeks.  If you have not heard from [place] then please contact us. The fastest way to get your results is to register for My Chart.   IF you received an x-ray today, you will receive an invoice from [facility]. Please contact [facility] at [phone] with questions or concerns regarding your invoice.   IF you received labwork today, you will receive an invoice from [place]. Please contact [facility] at [phone] with questions or concerns regarding your invoice.   Our billing staff will not be able to assist you with questions regarding bills from these companies.  You will be contacted with the lab results as soon as they are available. The fastest way to get your results is to activate your My Chart account. Instructions are located on the last page of this paperwork. If you have not heard from [place] regarding the results in 2 weeks, please contact this office.     Glad to hear your symptoms are doing well.  Continue same dose of fluoxetine, hydroxyzine if needed at night.  Try to avoid more than 1 alcoholic drink per day at the most.  Follow-up in 6 months unless any worsening of symptoms sooner.

## 2018-11-15 NOTE — Progress Notes (Signed)
CC- f/u on bp and sleep and med review- Patient is doing fine on the med hydroxyzine that she was put at last visit. Patient stated she is sleeping a little better. It has helped the anxiety today's GAD-7=8 and PHQ9=7

## 2018-11-21 ENCOUNTER — Telehealth: Payer: PRIVATE HEALTH INSURANCE | Admitting: Family Medicine

## 2018-12-04 ENCOUNTER — Ambulatory Visit (INDEPENDENT_AMBULATORY_CARE_PROVIDER_SITE_OTHER): Payer: PRIVATE HEALTH INSURANCE | Admitting: Licensed Clinical Social Worker

## 2018-12-04 DIAGNOSIS — F419 Anxiety disorder, unspecified: Secondary | ICD-10-CM

## 2018-12-05 ENCOUNTER — Ambulatory Visit: Payer: PRIVATE HEALTH INSURANCE | Admitting: Licensed Clinical Social Worker

## 2018-12-10 ENCOUNTER — Encounter: Payer: Self-pay | Admitting: Family Medicine

## 2018-12-18 ENCOUNTER — Other Ambulatory Visit: Payer: Self-pay | Admitting: Family Medicine

## 2018-12-18 DIAGNOSIS — Z1231 Encounter for screening mammogram for malignant neoplasm of breast: Secondary | ICD-10-CM

## 2018-12-25 ENCOUNTER — Ambulatory Visit (INDEPENDENT_AMBULATORY_CARE_PROVIDER_SITE_OTHER): Payer: PRIVATE HEALTH INSURANCE | Admitting: Licensed Clinical Social Worker

## 2018-12-25 DIAGNOSIS — F419 Anxiety disorder, unspecified: Secondary | ICD-10-CM

## 2019-01-08 ENCOUNTER — Ambulatory Visit (INDEPENDENT_AMBULATORY_CARE_PROVIDER_SITE_OTHER): Payer: PRIVATE HEALTH INSURANCE | Admitting: Licensed Clinical Social Worker

## 2019-01-08 DIAGNOSIS — F419 Anxiety disorder, unspecified: Secondary | ICD-10-CM | POA: Diagnosis not present

## 2019-01-30 ENCOUNTER — Ambulatory Visit
Admission: RE | Admit: 2019-01-30 | Discharge: 2019-01-30 | Disposition: A | Discharge status: Home / Self Care | Payer: PRIVATE HEALTH INSURANCE | Source: Ambulatory Visit | Attending: Family Medicine | Admitting: Family Medicine

## 2019-01-30 ENCOUNTER — Other Ambulatory Visit: Payer: Self-pay

## 2019-01-30 DIAGNOSIS — Z1231 Encounter for screening mammogram for malignant neoplasm of breast: Secondary | ICD-10-CM

## 2019-02-04 ENCOUNTER — Other Ambulatory Visit: Payer: Self-pay

## 2019-02-05 ENCOUNTER — Encounter: Payer: Self-pay | Admitting: Women's Health

## 2019-02-05 ENCOUNTER — Ambulatory Visit: Payer: PRIVATE HEALTH INSURANCE | Admitting: Women's Health

## 2019-02-05 VITALS — BP 138/80 | Ht 67.0 in | Wt 111.0 lb

## 2019-02-05 DIAGNOSIS — Z01419 Encounter for gynecological examination (general) (routine) without abnormal findings: Secondary | ICD-10-CM

## 2019-02-05 DIAGNOSIS — Z13820 Encounter for screening for osteoporosis: Secondary | ICD-10-CM | POA: Diagnosis not present

## 2019-02-05 DIAGNOSIS — Z13220 Encounter for screening for lipoid disorders: Secondary | ICD-10-CM

## 2019-02-05 DIAGNOSIS — E559 Vitamin D deficiency, unspecified: Secondary | ICD-10-CM | POA: Diagnosis not present

## 2019-02-05 LAB — COMPREHENSIVE METABOLIC PANEL
AG Ratio: 1.6 (calc) (ref 1.0–2.5)
ALT: 18 U/L (ref 6–29)
AST: 21 U/L (ref 10–35)
Albumin: 4.3 g/dL (ref 3.6–5.1)
Alkaline phosphatase (APISO): 62 U/L (ref 37–153)
BUN: 12 mg/dL (ref 7–25)
CO2: 24 mmol/L (ref 20–32)
Calcium: 9.2 mg/dL (ref 8.6–10.4)
Chloride: 97 mmol/L — ABNORMAL LOW (ref 98–110)
Creat: 0.8 mg/dL (ref 0.50–1.05)
Globulin: 2.7 g/dL (calc) (ref 1.9–3.7)
Glucose, Bld: 90 mg/dL (ref 65–99)
Potassium: 3.9 mmol/L (ref 3.5–5.3)
Sodium: 132 mmol/L — ABNORMAL LOW (ref 135–146)
Total Bilirubin: 0.4 mg/dL (ref 0.2–1.2)
Total Protein: 7 g/dL (ref 6.1–8.1)

## 2019-02-05 LAB — CBC WITH DIFFERENTIAL/PLATELET
Absolute Monocytes: 590 cells/uL (ref 200–950)
Basophils Absolute: 58 cells/uL (ref 0–200)
Basophils Relative: 0.8 %
Eosinophils Absolute: 158 cells/uL (ref 15–500)
Eosinophils Relative: 2.2 %
HCT: 41.3 % (ref 35.0–45.0)
Hemoglobin: 14 g/dL (ref 11.7–15.5)
Lymphs Abs: 1764 cells/uL (ref 850–3900)
MCH: 30 pg (ref 27.0–33.0)
MCHC: 33.9 g/dL (ref 32.0–36.0)
MCV: 88.6 fL (ref 80.0–100.0)
MPV: 9.4 fL (ref 7.5–12.5)
Monocytes Relative: 8.2 %
Neutro Abs: 4630 cells/uL (ref 1500–7800)
Neutrophils Relative %: 64.3 %
Platelets: 290 10*3/uL (ref 140–400)
RBC: 4.66 10*6/uL (ref 3.80–5.10)
RDW: 12.7 % (ref 11.0–15.0)
Total Lymphocyte: 24.5 %
WBC: 7.2 10*3/uL (ref 3.8–10.8)

## 2019-02-05 LAB — LIPID PANEL
Cholesterol: 209 mg/dL — ABNORMAL HIGH (ref <200)
HDL: 96 mg/dL (ref >=50)
LDL Cholesterol (Calc): 97 mg/dL (calc)
Non-HDL Cholesterol (Calc): 113 mg/dL (calc) (ref <130)
Total CHOL/HDL Ratio: 2.2 (calc) (ref <5.0)
Triglycerides: 72 mg/dL (ref <150)

## 2019-02-05 MED ORDER — PREMARIN 0.625 MG/GM VA CREA: TOPICAL_CREAM | VAGINAL | 12 refills | Status: DC

## 2019-02-05 MED ORDER — FLUOXETINE HCL 10 MG PO CAPS: 10.0000 mg | ORAL_CAPSULE | Freq: Every day | ORAL | 4 refills | Status: DC

## 2019-02-05 NOTE — Patient Instructions (Signed)

## 2019-02-05 NOTE — Progress Notes (Signed)
Lindsey Sanford 17-Jun-1960 [id number]    History:    Presents for annual exam.  Postmenopausal no HRT with no bleeding.  Doing well on Prozac 10 mg good relief of depression reports no crying.  Normal Pap and mammogram history.  2019 benign colon polyps 5-year follow-up.  Has some osteoarthritis.  Past medical history, past surgical history, family history and social history were all reviewed and documented in the EPIC chart.  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  Mother having some problems with memory loss lives at [facility].  3 children all doing great, 1 daughter in medical residency, 1 daughter with PhD has a baby, son lives in [place].  ROS:  A ROS was performed and pertinent positives and negatives are included.  Exam:  Vitals:   02/05/19 0901  BP: 138/80  Weight: 111 lb (50.3 kg)  Height: 5\' 7"  (1.702 m)   Body mass index is 17.39 kg/m.   General appearance:  Normal Thyroid:  Symmetrical, normal in size, without palpable masses or nodularity. Respiratory  Auscultation:  Clear without wheezing or rhonchi Cardiovascular  Auscultation:  Regular rate, without rubs, murmurs or gallops  Edema/varicosities:  Not grossly evident Abdominal  Soft,nontender, without masses, guarding or rebound.  Liver/spleen:  No organomegaly noted  Hernia:  None appreciated  Skin  Inspection:  Grossly normal   Breasts: Examined lying and sitting.     Right: Without masses, retractions, discharge or axillary adenopathy.     Left: Without masses, retractions, discharge or axillary adenopathy. Gentitourinary   Inguinal/mons:  Normal without inguinal adenopathy  External genitalia:  Normal  BUS/Urethra/Skene's glands:  Normal  Vagina: Atrophic   Cervix:  Normal  Uterus: You may normal in size, shape and contour.  Midline and mobile  Adnexa/parametria:     Rt: Without masses or tenderness.   Lt: Without masses or tenderness.  Anus and perineum: Normal  Digital rectal exam: Normal  sphincter tone without palpated masses or tenderness  Assessment/Plan:  58 y.o. M WF G3, P3 for annual exam with complaint painful intercourse.  Postmenopausal on no HRT with dyspareunia/vaginal atrophy  Anxiety/Depression stable on Prozac 10 mg daily  Plan: Options for vaginal dryness reviewed would like a cream, Premarin vaginal cream 1 applicator 0000000 mg per vagina twice weekly instructed to call if continued problems, continue over-the-counter vaginal lubricants.  SBEs, continue annual screening mammogram, calcium rich foods, vitamin D 2000 daily encouraged.  Vitamin D level normal.  Prozac 10 mg p.o. daily is aware it is a low dose but doing well will continue, counseling as needed, self-care, leisure activities encouraged.  CBC, CMP, lipid panel, Pap normal 2019, new screening guidelines reviewed.    [place], 9:42 AM 02/05/2019

## 2019-02-07 ENCOUNTER — Ambulatory Visit: Payer: PRIVATE HEALTH INSURANCE | Admitting: Licensed Clinical Social Worker

## 2019-02-12 ENCOUNTER — Ambulatory Visit (INDEPENDENT_AMBULATORY_CARE_PROVIDER_SITE_OTHER): Payer: PRIVATE HEALTH INSURANCE | Admitting: Licensed Clinical Social Worker

## 2019-02-12 DIAGNOSIS — F419 Anxiety disorder, unspecified: Secondary | ICD-10-CM

## 2019-02-26 ENCOUNTER — Other Ambulatory Visit: Payer: Self-pay

## 2019-02-27 ENCOUNTER — Ambulatory Visit (INDEPENDENT_AMBULATORY_CARE_PROVIDER_SITE_OTHER): Payer: PRIVATE HEALTH INSURANCE

## 2019-02-27 ENCOUNTER — Other Ambulatory Visit: Payer: Self-pay | Admitting: Women's Health

## 2019-02-27 DIAGNOSIS — Z780 Asymptomatic menopausal state: Secondary | ICD-10-CM

## 2019-02-27 DIAGNOSIS — M810 Age-related osteoporosis without current pathological fracture: Secondary | ICD-10-CM

## 2019-02-27 DIAGNOSIS — Z13820 Encounter for screening for osteoporosis: Secondary | ICD-10-CM

## 2019-03-13 ENCOUNTER — Ambulatory Visit: Payer: PRIVATE HEALTH INSURANCE | Admitting: Licensed Clinical Social Worker

## 2019-03-14 ENCOUNTER — Ambulatory Visit: Payer: PRIVATE HEALTH INSURANCE | Admitting: Licensed Clinical Social Worker

## 2019-04-02 ENCOUNTER — Ambulatory Visit (INDEPENDENT_AMBULATORY_CARE_PROVIDER_SITE_OTHER): Payer: PRIVATE HEALTH INSURANCE | Admitting: Licensed Clinical Social Worker

## 2019-04-02 DIAGNOSIS — F419 Anxiety disorder, unspecified: Secondary | ICD-10-CM | POA: Diagnosis not present

## 2019-05-02 ENCOUNTER — Other Ambulatory Visit: Payer: Self-pay

## 2019-05-02 ENCOUNTER — Ambulatory Visit (INDEPENDENT_AMBULATORY_CARE_PROVIDER_SITE_OTHER): Payer: PRIVATE HEALTH INSURANCE | Admitting: Licensed Clinical Social Worker

## 2019-05-02 DIAGNOSIS — F419 Anxiety disorder, unspecified: Secondary | ICD-10-CM | POA: Diagnosis not present

## 2019-05-03 ENCOUNTER — Ambulatory Visit: Payer: PRIVATE HEALTH INSURANCE | Admitting: Obstetrics & Gynecology

## 2019-05-03 ENCOUNTER — Encounter: Payer: Self-pay | Admitting: Obstetrics & Gynecology

## 2019-05-03 VITALS — BP 122/78

## 2019-05-03 DIAGNOSIS — M810 Age-related osteoporosis without current pathological fracture: Secondary | ICD-10-CM

## 2019-05-03 NOTE — Patient Instructions (Signed)
1. Osteoporosis of femur without pathological fracture Counseling on medical treatment for osteoporosis done. Biphosphonate treatment and Prolia discussed, including usage, risks and benefits. After counseling patient decides not to start on medical treatment at this time. Prefers to optimize Vit D supplement, Ca++ intake to 1200-1500 mg daily and increase weightbearing physical activities. We will repeat a bone density at 1 to 2 years.  Lindsey Sanford, it was a pleasure meeting you today!

## 2019-05-03 NOTE — Progress Notes (Signed)
    [place] 02/15/1961 [id number]        59 y.o.  G3P3L3   RP: Osteoporosis counseling and management  HPI: First Bone Density 02/2019 showed Osteoporosis. No history of fragility fracture. No recent fall. On vitamin D and calcium supplements. Not exercising regularly.   OB History  Gravida Para Term Preterm AB Living  3 3       3   SAB TAB Ectopic Multiple Live Births               # Outcome Date GA Lbr Len/2nd Weight Sex Delivery Anes PTL Lv  3 Para           2 Para           1 Para             Past medical history,surgical history, problem list, medications, allergies, family history and social history were all reviewed and documented in the EPIC chart.   Directed ROS with pertinent positives and negatives documented in the history of present illness/assessment and plan.  Exam:  Vitals:   05/03/19 0900  BP: 122/78   General appearance:  Normal  Bone Density 02/27/2019:  Osteoporosis T-Score -2.7 at the Left Femoral Neck and -2.6 at the Right Femoral Neck.   Assessment/Plan:  59 y.o. G3P3   1. Osteoporosis of femur without pathological fracture Counseling on medical treatment for osteoporosis done. Biphosphonate treatment and Prolia discussed, including usage, risks and benefits. After counseling patient decides not to start on medical treatment at this time. Prefers to optimize Vit D supplement, Ca++ intake to 1200-1500 mg daily and increase weightbearing physical activities. We will repeat a bone density at 1 to 2 years.  Princess Bruins MD, 9:15 AM 05/03/2019

## 2019-05-17 ENCOUNTER — Other Ambulatory Visit: Payer: Self-pay

## 2019-05-17 ENCOUNTER — Ambulatory Visit: Payer: PRIVATE HEALTH INSURANCE | Admitting: Family Medicine

## 2019-05-17 ENCOUNTER — Encounter: Payer: Self-pay | Admitting: Family Medicine

## 2019-05-17 VITALS — BP 126/80 | HR 87 | Temp 97.5°F | Ht 67.0 in | Wt 114.0 lb

## 2019-05-17 DIAGNOSIS — F411 Generalized anxiety disorder: Secondary | ICD-10-CM | POA: Diagnosis not present

## 2019-05-17 DIAGNOSIS — G4700 Insomnia, unspecified: Secondary | ICD-10-CM | POA: Diagnosis not present

## 2019-05-17 NOTE — Patient Instructions (Addendum)
Glad to hear you are doing well. No med changes for now.   Here is another option for counseling: [facility]: [phone]   If you have lab work done today you will be contacted with your lab results within the next 2 weeks.  If you have not heard from [place] then please contact us. The fastest way to get your results is to register for My Chart.   IF you received an x-ray today, you will receive an invoice from [facility]. Please contact [facility] at [phone] with questions or concerns regarding your invoice.   IF you received labwork today, you will receive an invoice from [place]. Please contact [facility] at [phone] with questions or concerns regarding your invoice.   Our billing staff will not be able to assist you with questions regarding bills from these companies.  You will be contacted with the lab results as soon as they are available. The fastest way to get your results is to activate your My Chart account. Instructions are located on the last page of this paperwork. If you have not heard from [place] regarding the results in 2 weeks, please contact this office.

## 2019-05-17 NOTE — Progress Notes (Signed)
Subjective:  Patient ID: Lindsey Sanford, female    DOB: 08-02-1960  Age: 59 y.o. MRN: [id number]  CC:  Chief Complaint  Patient presents with  . Follow-up    Axiety. pt states her axiety has gone down some. pt hasn't had any panic attacks since last visit. pt states her medication is working really well for her with no side efffects. pt is sleeping better as well.    HPI ANYA Sanford presents for   Generalized anxiety disorder Discussed at telemedicine visit August 6.  Stable at that time on fluoxetine, had continue to follow-up with counselor [name].  Hydroxyzine few nights per week.  Discussed avoiding more than 1 alcoholic drink per day. Felt more agitated at 20mg  prozac last summer - 10mg  working well.  Feeling better than last visit. Dealing with stressors better, sleeping better. Hydroxyzine every few weeks.  Wine - 1-2 per night most nights - has cut back. 10 per week.  No new side effects with meds.  Therapy working well.  Some stress with mom and her declining memory - worse with pandemic. Also guardian for adult with disability.  Had both covid vaccines.   BP ok at last gynecology appointment.   GAD 7 : Generalized Anxiety Score 05/17/2019  Nervous, Anxious, on Edge 1  Control/stop worrying 0  Worry too much - different things 1  Trouble relaxing 1  Easily annoyed or irritable 0  Afraid - awful might happen 0   Depression screen [facility] 2/9 05/17/2019 05/17/2019 11/15/2018 10/02/2018 09/05/2018  Decreased Interest 0 0 1 0 1  Down, Depressed, Hopeless 0 0 1 - 0  PHQ - 2 Score 0 0 2 0 1  Altered sleeping - - 2 - -  Change in appetite - - 1 - -  Feeling bad or failure about yourself  - - 1 - -  Trouble concentrating - - 1 - -  Moving slowly or fidgety/restless - - 0 - -  Suicidal thoughts - - 0 - -  PHQ-9 Score - - 7 - -  Difficult doing work/chores - - Not difficult at all - -      History Patient Active Problem List   Diagnosis Date Noted  . Primary  osteoarthritis of both hands 02/22/2016  . Primary osteoarthritis of both feet 02/22/2016  . Fatigue 02/22/2016  . Osteochondroma of foot, right 02/22/2016  . Anxiety 07/15/2011   Past Medical History:  Diagnosis Date  . Anxiety   . Depression   . Mouth ulcers    most of your life/ due to stress  . Osteopenia    Past Surgical History:  Procedure Laterality Date  . [place]   right foot  . HERNIA REPAIR     s  . TONSILLECTOMY     84-40 years old   Allergies  Allergen Reactions  . Nitrofurantoin Monohyd Macro     Lip swelling   Prior to Admission medications   Medication Sig Start Date End Date Taking? Authorizing Provider  Calcium-Vitamin D (CALTRATE 600 PLUS-VIT D PO) Take by mouth.    [provider]  conjugated estrogens (PREMARIN) vaginal cream Apply 1 applicator .625 mg vaginally twice weekly 02/05/19   Huel Cote, NP  FLUoxetine (PROZAC) 10 MG capsule Take 1 capsule (10 mg total) by mouth daily. 02/05/19   Huel Cote, NP  hydrOXYzine (ATARAX/VISTARIL) 25 MG tablet TAKE 0.5-1 TABLETS (12.5-25 MG TOTAL) BY MOUTH AT BEDTIME AS NEEDED FOR  ANXIETY. 09/28/18   Wendie Agreste, MD  Multiple Vitamin (MULTIVITAMIN) tablet Take 1 tablet by mouth daily.    [provider]   Social History   Socioeconomic History  . Marital status: Married    Spouse name: Not on file  . Number of children: Not on file  . Years of education: Not on file  . Highest education level: Not on file  Occupational History  . Not on file  Tobacco Use  . Smoking status: Never Smoker  . Smokeless tobacco: Never Used  Substance and Sexual Activity  . Alcohol use: Yes    Alcohol/week: 4.0 standard drinks    Types: 4 Glasses of wine per week  . Drug use: No  . Sexual activity: Yes    Birth control/protection: None    Comment: INTERCOUSRE AGE 1, SEXUAL PARTNERS LESS THAN 5  Other Topics Concern  . Not on file  Social History Narrative  . Not on file   Social  Determinants of Health   Financial Resource Strain:   . Difficulty of Paying Living Expenses: Not on file  Food Insecurity:   . Worried About Charity fundraiser in the Last Year: Not on file  . Ran Out of Food in the Last Year: Not on file  Transportation Needs:   . Lack of Transportation (Medical): Not on file  . Lack of Transportation (Non-Medical): Not on file  Physical Activity:   . Days of Exercise per Week: Not on file  . Minutes of Exercise per Session: Not on file  Stress:   . Feeling of Stress : Not on file  Social Connections:   . Frequency of Communication with Friends and Family: Not on file  . Frequency of Social Gatherings with Friends and Family: Not on file  . Attends Religious Services: Not on file  . Active Member of Clubs or Organizations: Not on file  . Attends Archivist Meetings: Not on file  . Marital Status: Not on file  Intimate Partner Violence:   . Fear of Current or Ex-Partner: Not on file  . Emotionally Abused: Not on file  . Physically Abused: Not on file  . Sexually Abused: Not on file    Review of Systems   Objective:   Vitals:   05/17/19 0824 05/17/19 0834  BP: (!) 149/78 126/80  Pulse: 87   Temp: (!) 97.5 F (36.4 C)   TempSrc: Temporal   SpO2: 98%   Weight: 114 lb (51.7 kg)   Height: 5\' 7"  (1.702 m)      Physical Exam Vitals reviewed.  Constitutional:      Appearance: She is well-developed.  HENT:     Head: Normocephalic and atraumatic.  Eyes:     Conjunctiva/sclera: Conjunctivae normal.     Pupils: Pupils are equal, round, and reactive to light.  Neck:     Vascular: No carotid bruit.  Cardiovascular:     Rate and Rhythm: Normal rate and regular rhythm.     Heart sounds: Normal heart sounds.  Pulmonary:     Effort: Pulmonary effort is normal.     Breath sounds: Normal breath sounds.  Abdominal:     Palpations: Abdomen is soft. There is no pulsatile mass.     Tenderness: There is no abdominal tenderness.    Skin:    General: Skin is warm and dry.  Neurological:     Mental Status: She is alert and oriented to person, place, and time.  Psychiatric:  Behavior: Behavior normal.        Assessment & Plan:  PARNIKA Sanford is a 59 y.o. female . GAD (generalized anxiety disorder)  Insomnia, unspecified type Improved, continue same dose fluoxetine at 10 mg daily, hydroxyzine if needed.  Continue counseling, alternate numbers were also provided as her counselor will be retiring.  Monitor alcohol intake - commended on decrease.  recheck 6 months.   No orders of the defined types were placed in this encounter.  Patient Instructions   Glad to hear you are doing well. No med changes for now.   Here is another option for counseling: [facility]: [phone]   If you have lab work done today you will be contacted with your lab results within the next 2 weeks.  If you have not heard from [place] then please contact us. The fastest way to get your results is to register for My Chart.   IF you received an x-ray today, you will receive an invoice from [facility]. Please contact [facility] at [phone] with questions or concerns regarding your invoice.   IF you received labwork today, you will receive an invoice from [place]. Please contact [facility] at [phone] with questions or concerns regarding your invoice.   Our billing staff will not be able to assist you with questions regarding bills from these companies.  You will be contacted with the lab results as soon as they are available. The fastest way to get your results is to activate your My Chart account. Instructions are located on the last page of this paperwork. If you have not heard from [place] regarding the results in 2 weeks, please contact this office.         Signed, Merri Ray, MD [facility]

## 2019-05-21 ENCOUNTER — Other Ambulatory Visit: Payer: Self-pay

## 2019-05-21 ENCOUNTER — Ambulatory Visit (INDEPENDENT_AMBULATORY_CARE_PROVIDER_SITE_OTHER): Payer: PRIVATE HEALTH INSURANCE

## 2019-05-21 ENCOUNTER — Other Ambulatory Visit: Payer: Self-pay | Admitting: Podiatry

## 2019-05-21 ENCOUNTER — Ambulatory Visit: Payer: PRIVATE HEALTH INSURANCE | Admitting: Podiatry

## 2019-05-21 DIAGNOSIS — M79671 Pain in right foot: Secondary | ICD-10-CM

## 2019-05-21 DIAGNOSIS — M79672 Pain in left foot: Secondary | ICD-10-CM

## 2019-05-21 DIAGNOSIS — M722 Plantar fascial fibromatosis: Secondary | ICD-10-CM

## 2019-05-21 DIAGNOSIS — M779 Enthesopathy, unspecified: Secondary | ICD-10-CM

## 2019-05-21 DIAGNOSIS — M778 Other enthesopathies, not elsewhere classified: Secondary | ICD-10-CM | POA: Diagnosis not present

## 2019-05-21 NOTE — Progress Notes (Signed)
Subjective:   Patient ID: Lindsey Sanford, female   DOB: 59 y.o.   MRN: [id number]   HPI 59 year old female with a history of plantar fasciitis presents the office today requesting new orthotics.  She states that she is trying not having any discomfort however her orthotics are old and she needs to have a new pair made. She brings in her orthotics with her today. She gets some occasional pain if she wears shoes that are not as supportive. She gets some pain along the outside of the legs at times, pointing the lateral aspect along the peroneal tendons/muscles.   She does have a history of osteochondroma on the right foot that was "shaved down" while she was in college. She has not had any pain or issues with this sinc then.    Review of Systems  All other systems reviewed and are negative.  Past Medical History:  Diagnosis Date  . Anxiety   . Depression   . Mouth ulcers    most of your life/ due to stress  . Osteopenia     Past Surgical History:  Procedure Laterality Date  . [place]   right foot  . HERNIA REPAIR     s  . TONSILLECTOMY     49-63 years old     Current Outpatient Medications:  .  Calcium-Vitamin D (CALTRATE 600 PLUS-VIT D PO), Take by mouth., Disp: , Rfl:  .  conjugated estrogens (PREMARIN) vaginal cream, Apply 1 applicator .625 mg vaginally twice weekly, Disp: 42.5 g, Rfl: 12 .  FLUoxetine (PROZAC) 10 MG capsule, Take 1 capsule (10 mg total) by mouth daily., Disp: 90 capsule, Rfl: 4 .  hydrOXYzine (ATARAX/VISTARIL) 25 MG tablet, TAKE 0.5-1 TABLETS (12.5-25 MG TOTAL) BY MOUTH AT BEDTIME AS NEEDED FOR ANXIETY., Disp: 90 tablet, Rfl: 1 .  Multiple Vitamin (MULTIVITAMIN) tablet, Take 1 tablet by mouth daily., Disp: , Rfl:   Allergies  Allergen Reactions  . Nitrofurantoin Monohyd Macro     Lip swelling          Objective:  Physical Exam  General: AAO x3, NAD  Dermatological: Skin is warm, dry and supple bilateral. Nails x 10 are well manicured;  remaining integument appears unremarkable at this time. There are no open sores, no preulcerative lesions, no rash or signs of infection present.  Vascular: Dorsalis Pedis artery and Posterior Tibial artery pedal pulses are 2/4 bilateral with immedate capillary fill time. Pedal hair growth present. No varicosities and no lower extremity edema present bilateral. There is no pain with calf compression, swelling, warmth, erythema.   Neruologic: Grossly intact via light touch bilateral.   Musculoskeletal: Decreased medial arch height upon weightbearing.  Minimal discomfort worse, insertion of plantar fascia.  Achilles tendon appears to be intact.  Ankle, subtalar joint range of motion intact.  Mild restriction dorsiflexion of the first MPJs bilaterally.  Subjectively there is discomfort in the course the peroneal tendons, cramping at times but no pain today.  Upon evaluation of orthotics she does tend to wear out submetatarsal 1.  The orthotics do not contour the arch of the foot well.  Muscular strength 5/5 in all groups tested bilateral.  Gait: Unassisted, Nonantalgic.       Assessment:   Plantar fasciitis, tendinitis    Plan:  -Treatment options discussed including all alternatives, risks, and complications -Etiology of symptoms were discussed -X-rays obtained reviewed.  On the right foot to the second third metatarsals there is bone lesion identified.  This appears to be a chronic issue for her and there is no cause any pain. -She was measured for orthotics today with Liliane Channel. -Stretching exercises discussed.  -Monitor osteochondroma on the right foot.   RTC in 3-4 weeks to PUO with Liliane Channel or sooner if needed  Trula Slade DPM

## 2019-05-30 ENCOUNTER — Ambulatory Visit: Payer: PRIVATE HEALTH INSURANCE | Admitting: Licensed Clinical Social Worker

## 2019-06-10 ENCOUNTER — Other Ambulatory Visit: Payer: Self-pay

## 2019-06-10 ENCOUNTER — Encounter: Payer: PRIVATE HEALTH INSURANCE | Admitting: Orthotics

## 2019-06-24 ENCOUNTER — Ambulatory Visit (INDEPENDENT_AMBULATORY_CARE_PROVIDER_SITE_OTHER): Payer: PRIVATE HEALTH INSURANCE | Admitting: Psychology

## 2019-06-24 DIAGNOSIS — F419 Anxiety disorder, unspecified: Secondary | ICD-10-CM

## 2019-07-15 ENCOUNTER — Ambulatory Visit: Payer: PRIVATE HEALTH INSURANCE | Admitting: Psychology

## 2019-08-12 ENCOUNTER — Ambulatory Visit: Payer: PRIVATE HEALTH INSURANCE | Admitting: Psychology

## 2019-10-24 ENCOUNTER — Other Ambulatory Visit: Payer: Self-pay

## 2019-10-24 ENCOUNTER — Ambulatory Visit: Payer: PRIVATE HEALTH INSURANCE | Admitting: Family Medicine

## 2019-10-24 ENCOUNTER — Ambulatory Visit (INDEPENDENT_AMBULATORY_CARE_PROVIDER_SITE_OTHER): Payer: PRIVATE HEALTH INSURANCE

## 2019-10-24 ENCOUNTER — Encounter: Payer: Self-pay | Admitting: Family Medicine

## 2019-10-24 VITALS — BP 160/76 | HR 86 | Temp 98.2°F | Ht 67.0 in | Wt 116.0 lb

## 2019-10-24 DIAGNOSIS — M7711 Lateral epicondylitis, right elbow: Secondary | ICD-10-CM

## 2019-10-24 DIAGNOSIS — M25521 Pain in right elbow: Secondary | ICD-10-CM | POA: Diagnosis not present

## 2019-10-24 NOTE — Progress Notes (Signed)
Subjective:  Patient ID: Lindsey Sanford, female    DOB: [birth year]  Age: 59 y.o. MRN: [id number]  CC:  Chief Complaint  Patient presents with  . Elbow Pain    Pt reports pain in her R elbow that started back in May or June of 2021. Pt reports no trama to the area that she can remember. Pain is dull, ache, and stabing with certain motions. Pt reports a knote on her R elbow as well  that feels tender to the touch. 5-6/10 current pain level. Pt isn't fasting.    Lindsey Sanford presents for   Right elbow pain: Started late May into early June.  Last fall did have [place] injury but no pain in elbow at that time or until May.  Feels like prominent area on outside of elbow.  Had tendonitis in elbow in 2015 - used wrist brace and PT, shoulder sx's then as well. (nte reviewed - Lindsey Sanford 12/26/13 - lateral epicondylitis). Pain outside of elbow, notices with extension and downward grasp/carrying.  No prior elbow injection.  No weakness.  R hand dominant.  Tx: neoprene/copper sleeve, and wrist brace if irritated and more computer work. No meds.    History Patient Active Problem List   Diagnosis Date Noted  . Primary osteoarthritis of both hands 02/22/2016  . Primary osteoarthritis of both feet 02/22/2016  . Fatigue 02/22/2016  . Osteochondroma of foot, right 02/22/2016  . Anxiety 07/15/2011   Past Medical History:  Diagnosis Date  . Anxiety   . Depression   . Mouth ulcers    most of your life/ due to stress  . Osteopenia    Past Surgical History:  Procedure Laterality Date  . [place]   right foot  . HERNIA REPAIR     s  . TONSILLECTOMY     69-32 years old   Allergies  Allergen Reactions  . Nitrofurantoin Monohyd Macro     Lip swelling   Prior to Admission medications   Medication Sig Start Date End Date Taking? Authorizing Provider  Calcium-Vitamin D (CALTRATE 600 PLUS-VIT D PO) Take by mouth.   Yes [provider]  conjugated estrogens  (PREMARIN) vaginal cream Apply 1 applicator .625 mg vaginally twice weekly 02/05/19  Yes Huel Cote, NP  FLUoxetine (PROZAC) 10 MG capsule Take 1 capsule (10 mg total) by mouth daily. 02/05/19  Yes Huel Cote, NP  hydrOXYzine (ATARAX/VISTARIL) 25 MG tablet TAKE 0.5-1 TABLETS (12.5-25 MG TOTAL) BY MOUTH AT BEDTIME AS NEEDED FOR ANXIETY. 09/28/18  Yes Wendie Agreste, MD  Multiple Vitamin (MULTIVITAMIN) tablet Take 1 tablet by mouth daily.   Yes [provider]   Social History   Socioeconomic History  . Marital status: Married    Spouse name: Not on file  . Number of children: Not on file  . Years of education: Not on file  . Highest education level: Not on file  Occupational History  . Not on file  Tobacco Use  . Smoking status: Never Smoker  . Smokeless tobacco: Never Used  Vaping Use  . Vaping Use: Never used  Substance and Sexual Activity  . Alcohol use: Yes    Alcohol/week: 4.0 standard drinks    Types: 4 Glasses of wine per week  . Drug use: No  . Sexual activity: Yes    Birth control/protection: None    Comment: INTERCOUSRE AGE 18, SEXUAL PARTNERS LESS THAN 5  Other Topics Concern  .  Not on file  Social History Narrative  . Not on file   Social Determinants of Health   Financial Resource Strain:   . Difficulty of Paying Living Expenses:   Food Insecurity:   . Worried About Charity fundraiser in the Last Year:   . Arboriculturist in the Last Year:   Transportation Needs:   . Film/video editor (Medical):   Marland Kitchen Lack of Transportation (Non-Medical):   Physical Activity:   . Days of Exercise per Week:   . Minutes of Exercise per Session:   Stress:   . Feeling of Stress :   Social Connections:   . Frequency of Communication with Friends and Family:   . Frequency of Social Gatherings with Friends and Family:   . Attends Religious Services:   . Active Member of Clubs or Organizations:   . Attends Archivist Meetings:   Marland Kitchen Marital  Status:   Intimate Partner Violence:   . Fear of Current or Ex-Partner:   . Emotionally Abused:   Marland Kitchen Physically Abused:   . Sexually Abused:     Review of Systems  Per Lindsey.  Objective:   Vitals:   10/24/19 1043  BP: (!) 160/76  Pulse: 86  Temp: 98.2 F (36.8 C)  TempSrc: Temporal  SpO2: 97%  Weight: 116 lb (52.6 kg)  Height: 5\' 7"  (1.702 m)     Physical Exam Constitutional:      General: She is not in acute distress.    Appearance: She is well-developed.  HENT:     Head: Normocephalic and atraumatic.  Cardiovascular:     Rate and Rhythm: Normal rate.  Pulmonary:     Effort: Pulmonary effort is normal.  Musculoskeletal:     Comments: Right shoulder pain-free range of motion, full range of motion  Right elbow, full range of motion, skin intact, no erythema.  Possible slight prominence of the lateral epicondyle.  Reproduced pain over the lateral epicondyle, slightly distal.  Pain with resisted wrist and middle finger extension.  Forearm without other focal tenderness or bony tenderness  Right wrist Negative Tinel's, negative Finkelstein's, full range of motion.  No focal bony tenderness.  Neurological:     Mental Status: She is alert and oriented to person, place, and time.    DG ELBOW COMPLETE RIGHT (3+VIEW)  Result Date: 10/24/2019 CLINICAL DATA:  Right elbow pain. EXAM: RIGHT ELBOW - COMPLETE 3+ VIEW COMPARISON:  None. FINDINGS: There is no evidence of fracture, dislocation, or joint effusion. There is no evidence of arthropathy or other focal bone abnormality. Soft tissues are unremarkable. IMPRESSION: Negative. Electronically Signed   By: Marijo Conception M.D.   On: 10/24/2019 11:38     Assessment & Plan:  Lindsey Sanford is a 59 y.o. female . 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  No known injury,  slight prominence laterally, reassuring XR. ' -Counterforce brace prescribed with instructions of use, reason for use.  Okay to use wrist brace if frequent typing as that may also help.  Handout given on condition as well as potential home exercises.  Consider hand specialist evaluation if persistent.  Formal physical therapy may also be helpful.  Deferred injection at this time.  RTC precautions.   No orders of the defined types were placed in this encounter.  Patient Instructions  Area of pain does correspond with lateral epicondylitis or tennis elbow.  See information below.  Try the counterforce brace, still okay to use the wrist brace as that may also help.  If symptoms are not improving in the next few weeks, let me know and I am happy to refer you to a hand specialist to decide on next treatment, potentially physical therapy.  I did include a few exercises below as your symptoms improve if you would like to try those from home.  Return to the clinic or go to the nearest emergency room if any of your symptoms worsen or new symptoms occur.   Tennis Elbow  Tennis elbow (lateral epicondylitis) is inflammation of tendons in your outer forearm, near your elbow. Tendons are tissues that connect muscle to bone. When you have tennis elbow, inflammation affects the tendons that you use to bend your wrist and move your hand up. Inflammation occurs in the lower part of the upper arm bone (humerus), where the tendons connect to the bone (lateral epicondyle). Tennis elbow often affects people who play tennis, but anyone may get the condition from repeatedly extending the wrist or turning the forearm. What are the causes? This condition is usually caused by repeatedly extending the wrist, turning the forearm, and using the hands. It can result from sports or work that requires repetitive forearm movements. In some cases, it may be caused by a sudden injury. What increases the risk? You are more likely  to develop tennis elbow if you play tennis or another racket sport. You also have a higher risk if you frequently use your hands for work. Besides people who play tennis, others at greater risk include:  Musicians.  Carpenters, painters, and plumbers.  Cooks.  Cashiers.  People who work in [employer].  Architect workers.  Butchers.  People who use computers. What are the signs or symptoms? Symptoms of this condition include:  Pain and tenderness in the forearm and the outer part of the elbow. Pain may be felt only when using the arm, or it may be there all the time.  A burning feeling that starts in the elbow and spreads down the forearm.  A weak grip in the hand. How is this diagnosed? This condition may be diagnosed based on:  Your symptoms and medical history.  A physical exam.  X-rays.  MRI. How is this treated? Resting and icing your arm is often the first treatment. Your health care provider may also recommend:  Medicines to reduce pain and inflammation. These may be in the form of a pill, topical gels, or shots of a steroid medicine (cortisone).  An elbow strap to reduce stress on the area.  Physical therapy. This may include massage or exercises.  An elbow brace to restrict the movements that cause symptoms. If these treatments do not help relieve your symptoms, your health care provider may recommend surgery to remove damaged muscle and reattach healthy muscle to bone. Follow these instructions at home: Activity  Rest your elbow and wrist and avoid activities that cause symptoms, as told by your health care provider.  Do physical therapy exercises as instructed.  If you lift an object, lift it with your palm facing up. This reduces stress on your elbow. Lifestyle  If your tennis elbow is caused by sports, check your equipment and make sure that: ? You are using it correctly. ? It is the best fit for you.  If your tennis elbow is caused by work or  computer use, take frequent breaks to stretch your arm. Talk with your manager about ways to manage your condition at work. If you have a brace:  Wear the brace or strap as told by your health care provider. Remove it only as told by your health care provider.  Loosen the brace if your fingers tingle, become numb, or turn cold and blue.  Keep the brace clean.  If the brace is not waterproof, ask if you may remove it for bathing. If you must keep the brace on while bathing: ? Do not let it get wet. ? Cover it with a watertight covering when you take a bath or a shower. General instructions   If directed, put ice on the painful area: ? Put ice in a plastic bag. ? Place a towel between your skin and the bag. ? Leave the ice on for 20 minutes, 2-3 times a day.  Take over-the-counter and prescription medicines only as told by your health care provider.  Keep all follow-up visits as told by your health care provider. This is important. Contact a health care provider if:  You have pain that gets worse or does not get better with treatment.  You have numbness or weakness in your forearm, hand, or fingers. Summary  Tennis elbow (lateral epicondylitis) is inflammation of tendons in your outer forearm, near your elbow.  Common symptoms include pain and tenderness in your forearm and the outer part of your elbow.  This condition is usually caused by repeatedly extending your wrist, turning your forearm, and using your hands.  The first treatment is often resting and icing your arm to relieve symptoms. Further treatment may include taking medicine, getting physical therapy, wearing a brace or strap, or having surgery. This information is not intended to replace advice given to you by your health care provider. Make sure you discuss any questions you have with your health care provider. Document Revised: 12/22/2017 Document Reviewed: 01/10/2017 Elsevier Patient Education  [place] Ask your health care provider which exercises are safe for you. Do exercises exactly as told by your health care provider and adjust them as directed. It is normal to feel mild stretching, pulling, tightness, or discomfort as you do these exercises. Stop right away if you feel sudden pain or your pain gets worse. Do not begin these exercises until told by your health care provider. Stretching and range-of-motion exercises These exercises warm up your muscles and joints and improve the movement and flexibility of your elbow. These exercises also help to relieve pain, numbness, and tingling. Wrist flexion, assisted  1. Straighten your left / right elbow in front of you with your palm facing down toward the floor. ? If told by your health care provider, bend your left / right elbow to a 90-degree angle (right angle) at your side. 2. With your other hand, gently push over the back of your left / right hand so your fingers point toward the floor (flexion). Stop when you feel a gentle stretch on the back of your forearm. 3. Hold this position for __________ seconds. Repeat __________ times. Complete this exercise __________ times a day. Wrist extension, assisted  1. Straighten your left / right elbow in front of you with your palm facing up toward the ceiling. ? If told by your health care provider, bend your left / right elbow to a 90-degree angle (right angle) at your side. 2. With your other hand, gently pull your  left / right hand and fingers toward the floor (extension). Stop when you feel a gentle stretch on the palm side of your forearm. 3. Hold this position for __________ seconds. Repeat __________ times. Complete this exercise __________ times a day. Assisted forearm rotation, supination 1. Sit or stand with your left / right elbow bent to a 90-degree angle (right angle) at your side. 2. Using your uninjured hand, turn (rotate) your left / right palm up toward the  ceiling (supination) until you feel a gentle stretch along the inside of your forearm. 3. Hold this position for __________ seconds. Repeat __________ times. Complete this exercise __________ times a day. Assisted forearm rotation, pronation 1. Sit or stand with your left / right elbow bent to a 90-degree angle (right angle) at your side. 2. Using your uninjured hand, rotate your left / right palm down toward the floor (pronation) until you feel a gentle stretch along the outside of your forearm. 3. Hold this position for __________ seconds. Repeat __________ times. Complete this exercise __________ times a day. Strengthening exercises These exercises build strength and endurance in your forearm and elbow. Endurance is the ability to use your muscles for a long time, even after they get tired. Radial deviation  1. Stand with a __________ weight or a hammer in your left / right hand. Or, sit while holding a rubber exercise band or tubing, with your left / right forearm supported on a table or countertop. ? If you are standing, position your forearm so that your thumb is facing forward. If you are sitting, position your forearm so that the thumb is facing the ceiling. This is the neutral position. 2. Raise your hand upward in front of you so your thumb moves toward the ceiling (radial deviation), or pull up on the rubber tubing. Keep your forearm and elbow still while you move your wrist only. 3. Hold this position for __________ seconds. 4. Slowly return to the starting position. Repeat __________ times. Complete this exercise __________ times a day. Wrist extension, eccentric 1. Sit with your left / right forearm palm-down and supported on a table or other surface. Let your left / right wrist extend over the edge of the surface. 2. Hold a __________ weight or a piece of exercise band or tubing in your left / right hand. ? If using a rubber exercise band or tubing, hold the other end of the  tubing with your other hand. 3. Use your uninjured hand to move your left / right hand up toward the ceiling. 4. Take your uninjured hand away and slowly return to the starting position using only your left / right hand. Lowering your arm under tension is called eccentric extension. Repeat __________ times. Complete this exercise __________ times a day. Wrist extension Do not do this exercise if it causes pain at the outside of your elbow. Only do this exercise once instructed by your health care provider. 1. Sit with your left / right forearm supported on a table or other surface and your palm turned down toward the floor. Let your left / right wrist extend over the edge of the surface. 2. Hold a __________ weight or a piece of rubber exercise band or tubing. ? If you are using a rubber exercise band or tubing, hold the band or tubing in place with your other hand to provide resistance. 3. Slowly bend your wrist so your hand moves up toward the ceiling (extension). Move only your wrist, keeping your forearm and  elbow still. 4. Hold this position for __________ seconds. 5. Slowly return to the starting position. Repeat __________ times. Complete this exercise __________ times a day. Forearm rotation, supination To do this exercise, you will need a lightweight hammer or rubber mallet. 1. Sit with your left / right forearm supported on a table or other surface. Bend your elbow to a 90-degree angle (right angle). Position your forearm so that your palm is facing down toward the floor, with your hand resting over the edge of the table. 2. Hold a hammer in your left / right hand. ? To make this exercise easier, hold the hammer near the head of the hammer. ? To make this exercise harder, hold the hammer near the end of the handle. 3. Without moving your wrist or elbow, slowly rotate your forearm so your palm faces up toward the ceiling (supination). 4. Hold this position for __________  seconds. 5. Slowly return to the starting position. Repeat __________ times. Complete this exercise __________ times a day. Shoulder blade squeeze 1. Sit in a stable chair or stand with good posture. If you are sitting down, do not let your back touch the back of the chair. 2. Your arms should be at your sides with your elbows bent to a 90-degree angle (right angle). Position your forearms so that your thumbs are facing the ceiling (neutral position). 3. Without lifting your shoulders up, squeeze your shoulder blades tightly together. 4. Hold this position for __________ seconds. 5. Slowly release and return to the starting position. Repeat __________ times. Complete this exercise __________ times a day. This information is not intended to replace advice given to you by your health care provider. Make sure you discuss any questions you have with your health care provider. Document Revised: 07/19/2018 Document Reviewed: 05/22/2018 Elsevier Patient Education  [employer].   If you have lab work done today you will be contacted with your lab results within the next 2 weeks.  If you have not heard from [place] then please contact us. The fastest way to get your results is to register for My Chart.   IF you received an x-ray today, you will receive an invoice from [facility]. Please contact [facility] at [phone] with questions or concerns regarding your invoice.   IF you received labwork today, you will receive an invoice from [facility]. Please contact [facility] at [phone] with questions or concerns regarding your invoice.   Our billing staff will not be able to assist you with questions regarding bills from these companies.  You will be contacted with the lab results as soon as they are available. The fastest way to get your results is to activate your My Chart account. Instructions are located on the last page of this paperwork. If you have not heard from [place]  regarding the results in 2 weeks, please contact this office.         Signed, Merri Ray, MD [facility]

## 2019-10-24 NOTE — Patient Instructions (Addendum)
Area of pain does correspond with lateral epicondylitis or tennis elbow.  See information below.  Try the counterforce brace, still okay to use the wrist brace as that may also help.  If symptoms are not improving in the next few weeks, let me know and I am happy to refer you to a hand specialist to decide on next treatment, potentially physical therapy.  I did include a few exercises below as your symptoms improve if you would like to try those from home.  Return to the clinic or go to the nearest emergency room if any of your symptoms worsen or new symptoms occur.   Tennis Elbow  Tennis elbow (lateral epicondylitis) is inflammation of tendons in your outer forearm, near your elbow. Tendons are tissues that connect muscle to bone. When you have tennis elbow, inflammation affects the tendons that you use to bend your wrist and move your hand up. Inflammation occurs in the lower part of the upper arm bone (humerus), where the tendons connect to the bone (lateral epicondyle). Tennis elbow often affects people who play tennis, but anyone may get the condition from repeatedly extending the wrist or turning the forearm. What are the causes? This condition is usually caused by repeatedly extending the wrist, turning the forearm, and using the hands. It can result from sports or work that requires repetitive forearm movements. In some cases, it may be caused by a sudden injury. What increases the risk? You are more likely to develop tennis elbow if you play tennis or another racket sport. You also have a higher risk if you frequently use your hands for work. Besides people who play tennis, others at greater risk include:  Musicians.  Carpenters, painters, and plumbers.  Cooks.  Cashiers.  People who work in [employer].  Architect workers.  Butchers.  People who use computers. What are the signs or symptoms? Symptoms of this condition include:  Pain and tenderness in the forearm and the  outer part of the elbow. Pain may be felt only when using the arm, or it may be there all the time.  A burning feeling that starts in the elbow and spreads down the forearm.  A weak grip in the hand. How is this diagnosed? This condition may be diagnosed based on:  Your symptoms and medical history.  A physical exam.  X-rays.  MRI. How is this treated? Resting and icing your arm is often the first treatment. Your health care provider may also recommend:  Medicines to reduce pain and inflammation. These may be in the form of a pill, topical gels, or shots of a steroid medicine (cortisone).  An elbow strap to reduce stress on the area.  Physical therapy. This may include massage or exercises.  An elbow brace to restrict the movements that cause symptoms. If these treatments do not help relieve your symptoms, your health care provider may recommend surgery to remove damaged muscle and reattach healthy muscle to bone. Follow these instructions at home: Activity  Rest your elbow and wrist and avoid activities that cause symptoms, as told by your health care provider.  Do physical therapy exercises as instructed.  If you lift an object, lift it with your palm facing up. This reduces stress on your elbow. Lifestyle  If your tennis elbow is caused by sports, check your equipment and make sure that: ? You are using it correctly. ? It is the best fit for you.  If your tennis elbow is caused by work or computer  use, take frequent breaks to stretch your arm. Talk with your manager about ways to manage your condition at work. If you have a brace:  Wear the brace or strap as told by your health care provider. Remove it only as told by your health care provider.  Loosen the brace if your fingers tingle, become numb, or turn cold and blue.  Keep the brace clean.  If the brace is not waterproof, ask if you may remove it for bathing. If you must keep the brace on while bathing: ? Do  not let it get wet. ? Cover it with a watertight covering when you take a bath or a shower. General instructions   If directed, put ice on the painful area: ? Put ice in a plastic bag. ? Place a towel between your skin and the bag. ? Leave the ice on for 20 minutes, 2-3 times a day.  Take over-the-counter and prescription medicines only as told by your health care provider.  Keep all follow-up visits as told by your health care provider. This is important. Contact a health care provider if:  You have pain that gets worse or does not get better with treatment.  You have numbness or weakness in your forearm, hand, or fingers. Summary  Tennis elbow (lateral epicondylitis) is inflammation of tendons in your outer forearm, near your elbow.  Common symptoms include pain and tenderness in your forearm and the outer part of your elbow.  This condition is usually caused by repeatedly extending your wrist, turning your forearm, and using your hands.  The first treatment is often resting and icing your arm to relieve symptoms. Further treatment may include taking medicine, getting physical therapy, wearing a brace or strap, or having surgery. This information is not intended to replace advice given to you by your health care provider. Make sure you discuss any questions you have with your health care provider. Document Revised: 12/22/2017 Document Reviewed: 01/10/2017 Elsevier Patient Education  [place] Ask your health care provider which exercises are safe for you. Do exercises exactly as told by your health care provider and adjust them as directed. It is normal to feel mild stretching, pulling, tightness, or discomfort as you do these exercises. Stop right away if you feel sudden pain or your pain gets worse. Do not begin these exercises until told by your health care provider. Stretching and range-of-motion exercises These exercises warm up your muscles  and joints and improve the movement and flexibility of your elbow. These exercises also help to relieve pain, numbness, and tingling. Wrist flexion, assisted  1. Straighten your left / right elbow in front of you with your palm facing down toward the floor. ? If told by your health care provider, bend your left / right elbow to a 90-degree angle (right angle) at your side. 2. With your other hand, gently push over the back of your left / right hand so your fingers point toward the floor (flexion). Stop when you feel a gentle stretch on the back of your forearm. 3. Hold this position for __________ seconds. Repeat __________ times. Complete this exercise __________ times a day. Wrist extension, assisted  1. Straighten your left / right elbow in front of you with your palm facing up toward the ceiling. ? If told by your health care provider, bend your left / right elbow to a 90-degree angle (right angle) at your side. 2. With your other hand, gently pull your  left / right hand and fingers toward the floor (extension). Stop when you feel a gentle stretch on the palm side of your forearm. 3. Hold this position for __________ seconds. Repeat __________ times. Complete this exercise __________ times a day. Assisted forearm rotation, supination 1. Sit or stand with your left / right elbow bent to a 90-degree angle (right angle) at your side. 2. Using your uninjured hand, turn (rotate) your left / right palm up toward the ceiling (supination) until you feel a gentle stretch along the inside of your forearm. 3. Hold this position for __________ seconds. Repeat __________ times. Complete this exercise __________ times a day. Assisted forearm rotation, pronation 1. Sit or stand with your left / right elbow bent to a 90-degree angle (right angle) at your side. 2. Using your uninjured hand, rotate your left / right palm down toward the floor (pronation) until you feel a gentle stretch along the outside of  your forearm. 3. Hold this position for __________ seconds. Repeat __________ times. Complete this exercise __________ times a day. Strengthening exercises These exercises build strength and endurance in your forearm and elbow. Endurance is the ability to use your muscles for a long time, even after they get tired. Radial deviation  1. Stand with a __________ weight or a hammer in your left / right hand. Or, sit while holding a rubber exercise band or tubing, with your left / right forearm supported on a table or countertop. ? If you are standing, position your forearm so that your thumb is facing forward. If you are sitting, position your forearm so that the thumb is facing the ceiling. This is the neutral position. 2. Raise your hand upward in front of you so your thumb moves toward the ceiling (radial deviation), or pull up on the rubber tubing. Keep your forearm and elbow still while you move your wrist only. 3. Hold this position for __________ seconds. 4. Slowly return to the starting position. Repeat __________ times. Complete this exercise __________ times a day. Wrist extension, eccentric 1. Sit with your left / right forearm palm-down and supported on a table or other surface. Let your left / right wrist extend over the edge of the surface. 2. Hold a __________ weight or a piece of exercise band or tubing in your left / right hand. ? If using a rubber exercise band or tubing, hold the other end of the tubing with your other hand. 3. Use your uninjured hand to move your left / right hand up toward the ceiling. 4. Take your uninjured hand away and slowly return to the starting position using only your left / right hand. Lowering your arm under tension is called eccentric extension. Repeat __________ times. Complete this exercise __________ times a day. Wrist extension Do not do this exercise if it causes pain at the outside of your elbow. Only do this exercise once instructed by your  health care provider. 1. Sit with your left / right forearm supported on a table or other surface and your palm turned down toward the floor. Let your left / right wrist extend over the edge of the surface. 2. Hold a __________ weight or a piece of rubber exercise band or tubing. ? If you are using a rubber exercise band or tubing, hold the band or tubing in place with your other hand to provide resistance. 3. Slowly bend your wrist so your hand moves up toward the ceiling (extension). Move only your wrist, keeping your forearm and  elbow still. 4. Hold this position for __________ seconds. 5. Slowly return to the starting position. Repeat __________ times. Complete this exercise __________ times a day. Forearm rotation, supination To do this exercise, you will need a lightweight hammer or rubber mallet. 1. Sit with your left / right forearm supported on a table or other surface. Bend your elbow to a 90-degree angle (right angle). Position your forearm so that your palm is facing down toward the floor, with your hand resting over the edge of the table. 2. Hold a hammer in your left / right hand. ? To make this exercise easier, hold the hammer near the head of the hammer. ? To make this exercise harder, hold the hammer near the end of the handle. 3. Without moving your wrist or elbow, slowly rotate your forearm so your palm faces up toward the ceiling (supination). 4. Hold this position for __________ seconds. 5. Slowly return to the starting position. Repeat __________ times. Complete this exercise __________ times a day. Shoulder blade squeeze 1. Sit in a stable chair or stand with good posture. If you are sitting down, do not let your back touch the back of the chair. 2. Your arms should be at your sides with your elbows bent to a 90-degree angle (right angle). Position your forearms so that your thumbs are facing the ceiling (neutral position). 3. Without lifting your shoulders up, squeeze  your shoulder blades tightly together. 4. Hold this position for __________ seconds. 5. Slowly release and return to the starting position. Repeat __________ times. Complete this exercise __________ times a day. This information is not intended to replace advice given to you by your health care provider. Make sure you discuss any questions you have with your health care provider. Document Revised: 07/19/2018 Document Reviewed: 05/22/2018 Elsevier Patient Education  [employer].   If you have lab work done today you will be contacted with your lab results within the next 2 weeks.  If you have not heard from [place] then please contact us. The fastest way to get your results is to register for My Chart.   IF you received an x-ray today, you will receive an invoice from [facility]. Please contact [facility] at [phone] with questions or concerns regarding your invoice.   IF you received labwork today, you will receive an invoice from [facility]. Please contact [facility] at [phone] with questions or concerns regarding your invoice.   Our billing staff will not be able to assist you with questions regarding bills from these companies.  You will be contacted with the lab results as soon as they are available. The fastest way to get your results is to activate your My Chart account. Instructions are located on the last page of this paperwork. If you have not heard from [place] regarding the results in 2 weeks, please contact this office.

## 2019-10-30 ENCOUNTER — Telehealth: Payer: Self-pay | Admitting: Family Medicine

## 2019-10-30 NOTE — Telephone Encounter (Signed)
Spoke with Dr. Carlota Raspberry and he wrote the Rx for the elbow brace. I have called and informed the pt also apologized for the inconvenience. I have placed the Rx in the pt paper work box up front. Pt should be here later today to pick it up

## 2019-10-30 NOTE — Telephone Encounter (Signed)
Pt called states she still wetting for her elbow counter brace she want now if is ready to be pick up Please advice

## 2019-10-30 NOTE — Telephone Encounter (Signed)
Called pt to inform her the [employer] rep has still yet to get back to me or the clinic and we have not yet been restocked on the braces specifically the tennis elbow brace. I will talk with Dr. Carlota Raspberry about writing an order for the pt to pick up and take to a medical supply store.

## 2019-10-31 ENCOUNTER — Encounter: Payer: Self-pay | Admitting: Family Medicine

## 2019-10-31 NOTE — Telephone Encounter (Signed)
Pt has questions about tennis elbow splint? Is this the appropriate splint for her condition?

## 2019-11-01 NOTE — Telephone Encounter (Signed)
Please check with our [employer] rep about tennis elbow splint. Lindsey Sanford had tried to reach out prior. Thanks.

## 2019-11-04 ENCOUNTER — Telehealth: Payer: Self-pay | Admitting: Emergency Medicine

## 2019-11-04 NOTE — Telephone Encounter (Signed)
Spoke to patient she stated she has been chatting with Dr. Carlota Raspberry and he gave her an order to buy one otc. She already brought one and will try the one she got and if it do not work she will give [place] a call to see about getting the one we have here in the office.

## 2019-11-15 ENCOUNTER — Encounter: Payer: Self-pay | Admitting: Family Medicine

## 2019-11-20 ENCOUNTER — Encounter: Payer: Self-pay | Admitting: Family Medicine

## 2019-11-20 ENCOUNTER — Ambulatory Visit: Payer: PRIVATE HEALTH INSURANCE | Admitting: Family Medicine

## 2019-11-20 ENCOUNTER — Other Ambulatory Visit: Payer: Self-pay

## 2019-11-20 VITALS — BP 148/82 | HR 78 | Temp 98.0°F | Ht 67.0 in | Wt 118.0 lb

## 2019-11-20 DIAGNOSIS — M7711 Lateral epicondylitis, right elbow: Secondary | ICD-10-CM | POA: Diagnosis not present

## 2019-11-20 DIAGNOSIS — M25521 Pain in right elbow: Secondary | ICD-10-CM

## 2019-11-20 DIAGNOSIS — F439 Reaction to severe stress, unspecified: Secondary | ICD-10-CM

## 2019-11-20 DIAGNOSIS — F411 Generalized anxiety disorder: Secondary | ICD-10-CM

## 2019-11-20 NOTE — Progress Notes (Signed)
Subjective:  Patient ID: Lindsey Sanford, female    DOB: 08-25-1960  Age: 59 y.o. MRN: [id number]  CC:  Chief Complaint  Patient presents with  . Follow-up    Anxiety. pt reports her anxiety is up a little bit due to the new covid varient. R Elbow- Pt reports it's feelsing better. pt got a race from a medical supply store and that seems to be helping. pt isn't fasting today.    HPI HAROLD MATTES presents for   Anxiety: Generalized anxiety disorder. Previous fluoxetine, 10 mg.  20 mg in the past cause increased agitation.  Had also met with therapist.  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  Does report some increased anxiety with new/worsening spread of COVID-19 with new delta variant.  She is vaccinated. Therapist Zella Ball, last saw in July. Trying to reframe thinking - has appt in 2 weeks.  Stress eating at times.  Exercise: walking at work. Some meditation.  GAD 7 : Generalized Anxiety Score 11/20/2019 05/17/2019  Nervous, Anxious, on Edge 2 1  Control/stop worrying 1 0  Worry too much - different things 2 1  Trouble relaxing 2 1  Restless 1 -  Easily annoyed or irritable 1 0  Afraid - awful might happen 1 0  Total GAD 7 Score 10 -   Right elbow pain, lateral epicondylosis Diagnosed July 15.  Counterforce bracing discussed, over-the-counter brace has been used with some improvement.  Handout was also given. No exercises yet, but soreness improved.   Wt Readings from Last 3 Encounters:  11/20/19 118 lb (53.5 kg)  10/24/19 116 lb (52.6 kg)  05/17/19 114 lb (51.7 kg)     History Patient Active Problem List   Diagnosis Date Noted  . Primary osteoarthritis of both hands 02/22/2016  . Primary osteoarthritis of both feet 02/22/2016  . Fatigue 02/22/2016  . Osteochondroma of foot, right 02/22/2016  . Anxiety 07/15/2011   Past Medical History:  Diagnosis Date  . Anxiety   . Depression   .  Mouth ulcers    most of your life/ due to stress  . Osteopenia    Past Surgical History:  Procedure Laterality Date  . [place]   right foot  . HERNIA REPAIR     s  . TONSILLECTOMY     99-14 years old   Allergies  Allergen Reactions  . Nitrofurantoin Monohyd Macro     Lip swelling   Prior to Admission medications   Medication Sig Start Date End Date Taking? Authorizing Provider  Calcium-Vitamin D (CALTRATE 600 PLUS-VIT D PO) Take by mouth.   Yes [provider]  conjugated estrogens (PREMARIN) vaginal cream Apply 1 applicator .625 mg vaginally twice weekly 02/05/19  Yes Huel Cote, NP  FLUoxetine (PROZAC) 10 MG capsule Take 1 capsule (10 mg total) by mouth daily. 02/05/19  Yes Huel Cote, NP  hydrOXYzine (ATARAX/VISTARIL) 25 MG tablet TAKE 0.5-1 TABLETS (12.5-25 MG TOTAL) BY MOUTH AT BEDTIME AS NEEDED FOR ANXIETY. 09/28/18  Yes Wendie Agreste, MD  Multiple Vitamin (MULTIVITAMIN) tablet Take 1 tablet by mouth daily.   Yes [provider]   Social History   Socioeconomic History  . Marital status: Married    Spouse name: Not on file  . Number of children: Not on file  . Years of education: Not on file  . Highest education level: Not on file  Occupational  History  . Not on file  Tobacco Use  . Smoking status: Never Smoker  . Smokeless tobacco: Never Used  Vaping Use  . Vaping Use: Never used  Substance and Sexual Activity  . Alcohol use: Yes    Alcohol/week: 4.0 standard drinks    Types: 4 Glasses of wine per week  . Drug use: No  . Sexual activity: Yes    Birth control/protection: None    Comment: INTERCOUSRE AGE 35, SEXUAL PARTNERS LESS THAN 5  Other Topics Concern  . Not on file  Social History Narrative  . Not on file   Social Determinants of Health   Financial Resource Strain:   . Difficulty of Paying Living Expenses:   Food Insecurity:   . Worried About Charity fundraiser in the Last Year:   . Arboriculturist in the  Last Year:   Transportation Needs:   . Film/video editor (Medical):   Marland Kitchen Lack of Transportation (Non-Medical):   Physical Activity:   . Days of Exercise per Week:   . Minutes of Exercise per Session:   Stress:   . Feeling of Stress :   Social Connections:   . Frequency of Communication with Friends and Family:   . Frequency of Social Gatherings with Friends and Family:   . Attends Religious Services:   . Active Member of Clubs or Organizations:   . Attends Archivist Meetings:   Marland Kitchen Marital Status:   Intimate Partner Violence:   . Fear of Current or Ex-Partner:   . Emotionally Abused:   Marland Kitchen Physically Abused:   . Sexually Abused:     Review of Systems Per hpi.   Objective:   Vitals:   11/20/19 0812  BP: (!) 148/82  Pulse: 78  Temp: 98 F (36.7 C)  TempSrc: Temporal  SpO2: 98%  Weight: 118 lb (53.5 kg)  Height: _0  (1.702 m)     Physical Exam Vitals reviewed.  Constitutional:      General: She is not in acute distress.    Appearance: She is well-developed.  HENT:     Head: Normocephalic and atraumatic.  Cardiovascular:     Rate and Rhythm: Normal rate.  Pulmonary:     Effort: Pulmonary effort is normal.  Musculoskeletal:     Comments: Right elbow minimal tenderness at epicondyle.  Full range of motion.  Brace in place.  Neurological:     General: No focal deficit present.     Mental Status: She is alert and oriented to person, place, and time.  Psychiatric:        Attention and Perception: Attention normal.        Mood and Affect: Mood is anxious. Affect is tearful.        Speech: Speech normal.        Behavior: Behavior normal.        Thought Content: Thought content normal.        Assessment & Plan:  JILENE SPOHR is a 58 y.o. female . Lateral epicondylitis of right elbow Elbow pain, right  -Improving lateral epicondylosis.  Continue brace, home exercise program per previous handout.  RTC precautions  GAD (generalized anxiety  disorder) Situational stress  -Continue fluoxetine same dose, did not tolerate higher doses.  Continue counseling.  Reframing thoughts was discussed as well as coping techniques.  Also counseled that she is vaccinated, and her loved ones are also vaccinated, low risk of severe disease if they were infected  with COVID-19.  Minimize exposure to news or other stressors throughout the day.  Recheck 6 months  No orders of the defined types were placed in this encounter.  Patient Instructions    Continue brace for elbow, then start exercises per handout. Return to the clinic if not continuing to improve.   Continue to follow up with Sharyn Lull, and reframing thinking. Minimize news or other media that worsens anxiety. Same dose fluoxetine for now. Exercise and coping techniques as we discussed.  Recheck in 6 months but let me know if I can help sooner.  Thanks for coming in today.  If you have lab work done today you will be contacted with your lab results within the next 2 weeks.  If you have not heard from [place] then please contact us. The fastest way to get your results is to register for My Chart.   IF you received an x-ray today, you will receive an invoice from [facility]. Please contact [facility] at [phone] with questions or concerns regarding your invoice.   IF you received labwork today, you will receive an invoice from [place]. Please contact [facility] at [phone] with questions or concerns regarding your invoice.   Our billing staff will not be able to assist you with questions regarding bills from these companies.  You will be contacted with the lab results as soon as they are available. The fastest way to get your results is to activate your My Chart account. Instructions are located on the last page of this paperwork. If you have not heard from [place] regarding the results in 2 weeks, please contact this office.         Signed, Merri Ray, MD [facility]

## 2019-11-20 NOTE — Patient Instructions (Addendum)
  Continue brace for elbow, then start exercises per handout. Return to the clinic if not continuing to improve.   Continue to follow up with Sharyn Lull, and reframing thinking. Minimize news or other media that worsens anxiety. Same dose fluoxetine for now. Exercise and coping techniques as we discussed.  Recheck in 6 months but let me know if I can help sooner.  Thanks for coming in today.  If you have lab work done today you will be contacted with your lab results within the next 2 weeks.  If you have not heard from [place] then please contact us. The fastest way to get your results is to register for My Chart.   IF you received an x-ray today, you will receive an invoice from [facility]. Please contact [facility] at [phone] with questions or concerns regarding your invoice.   IF you received labwork today, you will receive an invoice from [place]. Please contact [facility] at [phone] with questions or concerns regarding your invoice.   Our billing staff will not be able to assist you with questions regarding bills from these companies.  You will be contacted with the lab results as soon as they are available. The fastest way to get your results is to activate your My Chart account. Instructions are located on the last page of this paperwork. If you have not heard from [place] regarding the results in 2 weeks, please contact this office.

## 2020-01-14 ENCOUNTER — Other Ambulatory Visit: Payer: Self-pay

## 2020-01-14 ENCOUNTER — Ambulatory Visit (INDEPENDENT_AMBULATORY_CARE_PROVIDER_SITE_OTHER): Payer: BC Managed Care – PPO | Admitting: Family Medicine

## 2020-01-14 DIAGNOSIS — Z23 Encounter for immunization: Secondary | ICD-10-CM

## 2020-01-14 NOTE — Patient Instructions (Signed)
° ° ° °  If you have lab work done today you will be contacted with your lab results within the next 2 weeks.  If you have not heard from us then please contact us. The fastest way to get your results is to register for My Chart. ° ° °IF you received an x-ray today, you will receive an invoice from [facility]. Please contact [facility] at [phone] with questions or concerns regarding your invoice.  ° °IF you received labwork today, you will receive an invoice from [facility]. Please contact [facility] at [phone] with questions or concerns regarding your invoice.  ° °Our billing staff will not be able to assist you with questions regarding bills from these companies. ° °You will be contacted with the lab results as soon as they are available. The fastest way to get your results is to activate your My Chart account. Instructions are located on the last page of this paperwork. If you have not heard from us regarding the results in 2 weeks, please contact this office. °  ° ° ° °

## 2020-03-10 ENCOUNTER — Encounter: Payer: Self-pay | Admitting: Nurse Practitioner

## 2020-03-10 ENCOUNTER — Other Ambulatory Visit: Payer: Self-pay

## 2020-03-10 ENCOUNTER — Other Ambulatory Visit: Payer: Self-pay | Admitting: Nurse Practitioner

## 2020-03-10 ENCOUNTER — Ambulatory Visit (INDEPENDENT_AMBULATORY_CARE_PROVIDER_SITE_OTHER): Payer: BC Managed Care – PPO | Admitting: Nurse Practitioner

## 2020-03-10 VITALS — BP 146/88 | Ht 67.0 in | Wt 115.0 lb

## 2020-03-10 DIAGNOSIS — Z01419 Encounter for gynecological examination (general) (routine) without abnormal findings: Secondary | ICD-10-CM | POA: Diagnosis not present

## 2020-03-10 DIAGNOSIS — Z13220 Encounter for screening for lipoid disorders: Secondary | ICD-10-CM | POA: Diagnosis not present

## 2020-03-10 DIAGNOSIS — N951 Menopausal and female climacteric states: Secondary | ICD-10-CM

## 2020-03-10 DIAGNOSIS — M810 Age-related osteoporosis without current pathological fracture: Secondary | ICD-10-CM

## 2020-03-10 MED ORDER — PREMARIN 0.625 MG/GM VA CREA: TOPICAL_CREAM | VAGINAL | 12 refills | Status: DC

## 2020-03-10 NOTE — Telephone Encounter (Signed)
Regarding Estrace cream. THIS IS NOT A REFILL REQUEST. Rx says "Pharmacy comment: Alternative Requested:NOT COVERED BY INS, OVER 250 ON MANUFACTURE COPAY CARD."

## 2020-03-10 NOTE — Telephone Encounter (Signed)
Written on the Rx " All Pharmacy Suggested Alternatives:   0 estradiol (ESTRACE) 0.1 MG/GM vaginal cream 0 Estradiol (IMVEXXY MAINTENANCE PACK) 10 MCG INST  To prescribe one of the alternatives listed above, open the encounter and click Replace.    This was NOT a refll request but a request to change RX due to cost

## 2020-03-10 NOTE — Progress Notes (Signed)
   Lindsey Sanford 1957/05/31 [id number]   History:  59 y.o. G3P3 presents for annual exam. Postmenopausal - no bleeding, using Premarin for vaginal dryness/dyspareunia but has not been using consistently. Normal pap and mammogram history. Osteoporosis of bilateral hips-declines treatment.   Gynecologic History Patient's last menstrual period was 09/11/2013.   Contraception: post menopausal status Last Pap: 01/2018. Results were: normal Last mammogram: 01/2019. Results were: normal Last colonoscopy: 12/2017. Results were: polyps, 5 year recall Last Dexa: 02/2019. Results were: t-score -2.7  Past medical history, past surgical history, family history and social history were all reviewed and documented in the EPIC chart.  ROS:  A ROS was performed and pertinent positives and negatives are included.  Exam:  Vitals:   03/10/20 0826  BP: (!) 146/88  Weight: 115 lb (52.2 kg)  Height: 5\' 7"  (1.702 m)   Body mass index is 18.01 kg/m.  General appearance:  Normal Thyroid:  Symmetrical, normal in size, without palpable masses or nodularity. Respiratory  Auscultation:  Clear without wheezing or rhonchi Cardiovascular  Auscultation:  Regular rate, without rubs, murmurs or gallops  Edema/varicosities:  Not grossly evident Abdominal  Soft,nontender, without masses, guarding or rebound.  Liver/spleen:  No organomegaly noted  Hernia:  None appreciated  Skin  Inspection:  Grossly normal   Breasts: Examined lying and sitting.   Right: Without masses, retractions, discharge or axillary adenopathy.   Left: Without masses, retractions, discharge or axillary adenopathy. Gentitourinary   Inguinal/mons:  Normal without inguinal adenopathy  External genitalia:  Normal  BUS/Urethra/Skene's glands:  Normal  Vagina:  Atrophic changes  Cervix:  Normal  Uterus:  Normal in size, shape and contour.  Midline and mobile  Adnexa/parametria:     Rt: Without masses or tenderness.   Lt: Without  masses or tenderness.  Anus and perineum: Normal  Digital rectal exam: Normal sphincter tone without palpated masses or tenderness  Assessment/Plan:  59 y.o. G3P3 for annual exam.   Well female exam with routine gynecological exam - Plan: CBC with Differential/Platelet, Comprehensive metabolic panel. Education provided on SBEs, importance of preventative screenings, current guidelines, high calcium diet, regular exercise, and multivitamin daily.   Age-related osteoporosis without current pathological fracture - declines treatment at this time and would like to optimize vitamin D supplement and continue exercising. Will repeat bone Density in 1 year.   Menopausal vaginal dryness - Plan: conjugated estrogens (PREMARIN) vaginal cream twice weekly. Has not been using consistently but wants to start again. Refill x 1 year provided. Recommend coconut oil or oil-based lubricant for intercourse.   Lipid screening - Plan: Lipid panel  Screening for cervical cancer -normal Pap history.  Will repeat at 5-year interval per guidelines.  Screening for breast cancer -normal mammogram history.  Continue annual screenings.  Normal breast exam today.  Screening for colon cancer -we will repeat screening colonoscopy at GI's recommended interval.  Follow-up in 1 year for annual.       Tamela Gammon [facility], 8:35 AM 03/10/2020

## 2020-03-10 NOTE — Patient Instructions (Signed)

## 2020-03-11 LAB — LIPID PANEL
Cholesterol: 223 mg/dL — ABNORMAL HIGH (ref <200)
HDL: 98 mg/dL (ref >=50)
LDL Cholesterol (Calc): 110 mg/dL (calc) — ABNORMAL HIGH
Non-HDL Cholesterol (Calc): 125 mg/dL (calc) (ref <130)
Total CHOL/HDL Ratio: 2.3 (calc) (ref <5.0)
Triglycerides: 61 mg/dL (ref <150)

## 2020-03-11 LAB — CBC WITH DIFFERENTIAL/PLATELET
Absolute Monocytes: 505 cells/uL (ref 200–950)
Basophils Absolute: 71 cells/uL (ref 0–200)
Basophils Relative: 1.4 %
Eosinophils Absolute: 250 cells/uL (ref 15–500)
Eosinophils Relative: 4.9 %
HCT: 38.1 % (ref 35.0–45.0)
Hemoglobin: 13.2 g/dL (ref 11.7–15.5)
Lymphs Abs: 1311 cells/uL (ref 850–3900)
MCH: 30.6 pg (ref 27.0–33.0)
MCHC: 34.6 g/dL (ref 32.0–36.0)
MCV: 88.2 fL (ref 80.0–100.0)
MPV: 9.1 fL (ref 7.5–12.5)
Monocytes Relative: 9.9 %
Neutro Abs: 2963 cells/uL (ref 1500–7800)
Neutrophils Relative %: 58.1 %
Platelets: 301 10*3/uL (ref 140–400)
RBC: 4.32 10*6/uL (ref 3.80–5.10)
RDW: 12.4 % (ref 11.0–15.0)
Total Lymphocyte: 25.7 %
WBC: 5.1 10*3/uL (ref 3.8–10.8)

## 2020-03-11 LAB — COMPREHENSIVE METABOLIC PANEL
AG Ratio: 1.5 (calc) (ref 1.0–2.5)
ALT: 14 U/L (ref 6–29)
AST: 20 U/L (ref 10–35)
Albumin: 4.1 g/dL (ref 3.6–5.1)
Alkaline phosphatase (APISO): 65 U/L (ref 37–153)
BUN: 12 mg/dL (ref 7–25)
CO2: 24 mmol/L (ref 20–32)
Calcium: 9.2 mg/dL (ref 8.6–10.4)
Chloride: 102 mmol/L (ref 98–110)
Creat: 0.87 mg/dL (ref 0.50–1.05)
Globulin: 2.8 g/dL (calc) (ref 1.9–3.7)
Glucose, Bld: 90 mg/dL (ref 65–99)
Potassium: 4.2 mmol/L (ref 3.5–5.3)
Sodium: 134 mmol/L — ABNORMAL LOW (ref 135–146)
Total Bilirubin: 0.4 mg/dL (ref 0.2–1.2)
Total Protein: 6.9 g/dL (ref 6.1–8.1)

## 2020-03-30 ENCOUNTER — Other Ambulatory Visit: Payer: Self-pay | Admitting: *Deleted

## 2020-03-30 MED ORDER — FLUOXETINE HCL 10 MG PO CAPS: 10.0000 mg | ORAL_CAPSULE | Freq: Every day | ORAL | 3 refills | Status: DC

## 2020-03-30 NOTE — Telephone Encounter (Signed)
Annual exam was on 03/10/20

## 2020-05-22 ENCOUNTER — Telehealth (INDEPENDENT_AMBULATORY_CARE_PROVIDER_SITE_OTHER): Payer: BC Managed Care – PPO | Admitting: Family Medicine

## 2020-05-22 ENCOUNTER — Other Ambulatory Visit: Payer: Self-pay

## 2020-05-22 ENCOUNTER — Encounter: Payer: Self-pay | Admitting: Family Medicine

## 2020-05-22 DIAGNOSIS — G4700 Insomnia, unspecified: Secondary | ICD-10-CM

## 2020-05-22 DIAGNOSIS — F411 Generalized anxiety disorder: Secondary | ICD-10-CM | POA: Diagnosis not present

## 2020-05-22 MED ORDER — HYDROXYZINE HCL 25 MG PO TABS: 12.5000 mg | ORAL_TABLET | Freq: Every evening | ORAL | 1 refills | Status: DC | PRN

## 2020-05-22 NOTE — Progress Notes (Signed)
Subjective:  Patient ID: Lindsey Sanford, female    DOB: 10-18-1960  Age: 60 y.o. MRN: [id number]  CC:  Chief Complaint  Patient presents with  . Follow-up    On anxiety. Pt reports no changes to anxiety since last OV. Pt reports her current medication is working well with no side effects.    HPI Lindsey Sanford presents for   Generalized anxiety disorder Last discussed in August.  Fluoxetine 10 mg, increased agitation at the 20 mg dose.  Hydroxyzine as needed.  Therapist Zella Ball. GAD 7 : Generalized Anxiety Score 05/22/2020 11/20/2019 05/17/2019  Nervous, Anxious, on Edge 1 2 1   Control/stop worrying 0 1 0  Worry too much - different things 1 2 1   Trouble relaxing 2 2 1   Restless 2 1 -  Easily annoyed or irritable 1 1 0  Afraid - awful might happen 1 1 0  Total GAD 7 Score 8 10 -       History Patient Active Problem List   Diagnosis Date Noted  . Primary osteoarthritis of both hands 02/22/2016  . Primary osteoarthritis of both feet 02/22/2016  . Fatigue 02/22/2016  . Osteochondroma of foot, right 02/22/2016  . Anxiety 07/15/2011   Past Medical History:  Diagnosis Date  . Anxiety   . Depression   . Mouth ulcers    most of your life/ due to stress  . Osteopenia    Past Surgical History:  Procedure Laterality Date  . [employer]   right foot  . HERNIA REPAIR     s  . TONSILLECTOMY     39-61 years old   Allergies  Allergen Reactions  . Nitrofurantoin Monohyd Macro     Lip swelling   Prior to Admission medications   Medication Sig Start Date End Date Taking? Authorizing Provider  Calcium-Vitamin D (CALTRATE 600 PLUS-VIT D PO) Take by mouth.   Yes [provider]  estradiol (ESTRACE) 0.1 MG/GM vaginal cream Insert vaginally twice weekly 03/10/20  Yes Marny Lowenstein A, NP  FLUoxetine (PROZAC) 10 MG capsule Take 1 capsule (10 mg total) by mouth daily. 03/30/20  Yes Marny Lowenstein A, NP  hydrOXYzine (ATARAX/VISTARIL) 25 MG tablet TAKE  0.5-1 TABLETS (12.5-25 MG TOTAL) BY MOUTH AT BEDTIME AS NEEDED FOR ANXIETY. 09/28/18  Yes Wendie Agreste, MD  Multiple Vitamin (MULTIVITAMIN) tablet Take 1 tablet by mouth daily.   Yes [provider]   Social History   Socioeconomic History  . Marital status: Married    Spouse name: Not on file  . Number of children: Not on file  . Years of education: Not on file  . Highest education level: Not on file  Occupational History  . Not on file  Tobacco Use  . Smoking status: Never Smoker  . Smokeless tobacco: Never Used  Vaping Use  . Vaping Use: Never used  Substance and Sexual Activity  . Alcohol use: Yes    Alcohol/week: 4.0 standard drinks    Types: 4 Glasses of wine per week  . Drug use: No  . Sexual activity: Yes    Birth control/protection: None    Comment: INTERCOUSRE AGE 18, SEXUAL PARTNERS LESS THAN 5  Other Topics Concern  . Not on file  Social History Narrative  . Not on file   Social Determinants of Health   Financial Resource Strain: Not on file  Food Insecurity: Not on file  Transportation Needs: Not on file  Physical Activity: Not on  file  Stress: Not on file  Social Connections: Not on file  Intimate Partner Violence: Not on file    Review of Systems   Objective:   Vitals:   05/22/20 0934  Weight: 118 lb (53.5 kg)  Height: 5\' 7"  (1.702 m)     Physical Exam     Assessment & Plan:  Lindsey Sanford is a 60 y.o. female . Generalized anxiety disorder  Insomnia, unspecified type   No orders of the defined types were placed in this encounter.  Patient Instructions       If you have lab work done today you will be contacted with your lab results within the next 2 weeks.  If you have not heard from [place] then please contact us. The fastest way to get your results is to register for My Chart.   IF you received an x-ray today, you will receive an invoice from [facility]. Please contact [facility] at  [phone] with questions or concerns regarding your invoice.   IF you received labwork today, you will receive an invoice from [facility]. Please contact [facility] at [phone] with questions or concerns regarding your invoice.   Our billing staff will not be able to assist you with questions regarding bills from these companies.  You will be contacted with the lab results as soon as they are available. The fastest way to get your results is to activate your My Chart account. Instructions are located on the last page of this paperwork. If you have not heard from [place] regarding the results in 2 weeks, please contact this office.         Signed, Merri Ray, MD [facility]

## 2020-05-22 NOTE — Patient Instructions (Signed)
° ° ° °  If you have lab work done today you will be contacted with your lab results within the next 2 weeks.  If you have not heard from us then please contact us. The fastest way to get your results is to register for My Chart. ° ° °IF you received an x-ray today, you will receive an invoice from [facility]. Please contact [facility] at [phone] with questions or concerns regarding your invoice.  ° °IF you received labwork today, you will receive an invoice from [facility]. Please contact [facility] at [phone] with questions or concerns regarding your invoice.  ° °Our billing staff will not be able to assist you with questions regarding bills from these companies. ° °You will be contacted with the lab results as soon as they are available. The fastest way to get your results is to activate your My Chart account. Instructions are located on the last page of this paperwork. If you have not heard from us regarding the results in 2 weeks, please contact this office. °  ° ° ° °

## 2020-05-22 NOTE — Progress Notes (Signed)
Virtual Visit via Video Note  I connected with Lindsey Sanford on 05/22/20 at 11:29 AM by a video enabled telemedicine application and verified that I am speaking with the correct person using two identifiers.  Patient location:home My location: office.   I discussed the limitations, risks, security and privacy concerns of performing an evaluation and management service by telephone and the availability of in person appointments. I also discussed with the patient that there may be a patient responsible charge related to this service. The patient expressed understanding and agreed to proceed, consent obtained  Chief complaint:  Chief Complaint  Patient presents with  . Follow-up    On anxiety. Pt reports no changes to anxiety since last OV. Pt reports her current medication is working well with no side effects.     History of Present Illness: Lindsey Sanford is a 60 y.o. female   Generalized Anxiety.  Discussed in August. Stress with Omicron variant, but doing ok. Stress these times of the year - knows that March will be better. Seasonal component.  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  trying to spend time with mom- her memory is declining.  Had been meeting with Lindsey Sanford. Last visit in November, she has left practice. Plans to meet with different therapist with [facility] in March. Feel like she is ok to wait until that visit, but on cancellation list.  One recommendation was focusing on gratitude - signed up for class - science of well being.  Trying to be outside, exercise helps.  Feels like 10mg  Prozac doing well.  Hydroxyzine rarely for sleep - 4 times in past few months.   Depression screen [facility] 2/9 05/22/2020 11/20/2019 10/24/2019 05/17/2019 05/17/2019  Decreased Interest 0 0 0 0 0  Down, Depressed, Hopeless 1 0 0 0 0  PHQ - 2 Score 1 0 0 0 0  Altered sleeping - - - - -  Change in appetite - - - - -  Feeling bad or  failure about yourself  - - - - -  Trouble concentrating - - - - -  Moving slowly or fidgety/restless - - - - -  Suicidal thoughts - - - - -  PHQ-9 Score - - - - -  Difficult doing work/chores - - - - -     GAD 7 : Generalized Anxiety Score 05/22/2020 11/20/2019 05/17/2019  Nervous, Anxious, on Edge 1 2 1   Control/stop worrying 0 1 0  Worry too much - different things 1 2 1   Trouble relaxing 2 2 1   Restless 2 1 -  Easily annoyed or irritable 1 1 0  Afraid - awful might happen 1 1 0  Total GAD 7 Score 8 10 -     Patient Active Problem List   Diagnosis Date Noted  . Primary osteoarthritis of both hands 02/22/2016  . Primary osteoarthritis of both feet 02/22/2016  . Fatigue 02/22/2016  . Osteochondroma of foot, right 02/22/2016  . Anxiety 07/15/2011   Past Medical History:  Diagnosis Date  . Anxiety   . Depression   . Mouth ulcers    most of your life/ due to stress  . Osteopenia    Past Surgical History:  Procedure Laterality Date  . [place]   right foot  . HERNIA REPAIR     s  . TONSILLECTOMY     69-65 years old   Allergies  Allergen  Reactions  . Nitrofurantoin Monohyd Macro     Lip swelling   Prior to Admission medications   Medication Sig Start Date End Date Taking? Authorizing Provider  Calcium-Vitamin D (CALTRATE 600 PLUS-VIT D PO) Take by mouth.   Yes [provider]  estradiol (ESTRACE) 0.1 MG/GM vaginal cream Insert vaginally twice weekly 03/10/20  Yes Marny Lowenstein A, NP  FLUoxetine (PROZAC) 10 MG capsule Take 1 capsule (10 mg total) by mouth daily. 03/30/20  Yes Marny Lowenstein A, NP  hydrOXYzine (ATARAX/VISTARIL) 25 MG tablet TAKE 0.5-1 TABLETS (12.5-25 MG TOTAL) BY MOUTH AT BEDTIME AS NEEDED FOR ANXIETY. 09/28/18  Yes Wendie Agreste, MD  Multiple Vitamin (MULTIVITAMIN) tablet Take 1 tablet by mouth daily.   Yes [provider]   Social History   Socioeconomic History  . Marital status: Married    Spouse name: Not  on file  . Number of children: Not on file  . Years of education: Not on file  . Highest education level: Not on file  Occupational History  . Not on file  Tobacco Use  . Smoking status: Never Smoker  . Smokeless tobacco: Never Used  Vaping Use  . Vaping Use: Never used  Substance and Sexual Activity  . Alcohol use: Yes    Alcohol/week: 4.0 standard drinks    Types: 4 Glasses of wine per week  . Drug use: No  . Sexual activity: Yes    Birth control/protection: None    Comment: INTERCOUSRE AGE 79, SEXUAL PARTNERS LESS THAN 5  Other Topics Concern  . Not on file  Social History Narrative  . Not on file   Social Determinants of Health   Financial Resource Strain: Not on file  Food Insecurity: Not on file  Transportation Needs: Not on file  Physical Activity: Not on file  Stress: Not on file  Social Connections: Not on file  Intimate Partner Violence: Not on file    Observations/Objective: There were no vitals filed for this visit. Tearful at times during exam, somewhat anxious mood. Denies SI/HI. Appropriate responses. All questions were answered. Was treated understanding expressed.  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 Unfortunately unable to see her previous therapist at this time, but does have planned appointment with a different therapist within the next month, on cancellation list. Declined any other mental health referrals or resources at this time but advised to let me know if she would like alternatives. Option to increase fluoxetine to 20 mg but that was over activating the past. Continue hydroxyzine as needed. RTC precautions.  Follow Up Instructions: 6 months   I discussed the assessment and treatment plan with the patient. The patient was provided an opportunity to ask questions and all were answered. The patient agreed with the plan  and demonstrated an understanding of the instructions.   The patient was advised to call back or seek an in-person evaluation if the symptoms worsen or if the condition fails to improve as anticipated.  I provided 17 minutes of non-face-to-face time during this encounter.   Wendie Agreste, MD

## 2020-06-24 ENCOUNTER — Ambulatory Visit: Payer: BC Managed Care – PPO | Admitting: Psychology

## 2020-07-13 ENCOUNTER — Ambulatory Visit: Payer: BC Managed Care – PPO | Admitting: Psychology

## 2020-07-14 ENCOUNTER — Other Ambulatory Visit: Payer: Self-pay | Admitting: Family Medicine

## 2020-07-14 DIAGNOSIS — Z1231 Encounter for screening mammogram for malignant neoplasm of breast: Secondary | ICD-10-CM

## 2020-08-05 ENCOUNTER — Ambulatory Visit: Payer: BC Managed Care – PPO | Admitting: Psychology

## 2020-08-13 ENCOUNTER — Ambulatory Visit: Payer: BC Managed Care – PPO | Admitting: Family Medicine

## 2020-08-13 ENCOUNTER — Other Ambulatory Visit: Payer: Self-pay

## 2020-08-13 ENCOUNTER — Encounter: Payer: Self-pay | Admitting: Family Medicine

## 2020-08-13 VITALS — BP 140/72 | HR 72 | Temp 98.2°F | Resp 17 | Ht 67.0 in | Wt 119.0 lb

## 2020-08-13 DIAGNOSIS — M7542 Impingement syndrome of left shoulder: Secondary | ICD-10-CM

## 2020-08-13 DIAGNOSIS — M25512 Pain in left shoulder: Secondary | ICD-10-CM | POA: Diagnosis not present

## 2020-08-13 MED ORDER — MELOXICAM 7.5 MG PO TABS: 7.5000 mg | ORAL_TABLET | Freq: Every day | ORAL | 0 refills | Status: DC

## 2020-08-13 NOTE — Progress Notes (Signed)
Subjective:  Patient ID: Lindsey Sanford, female    DOB: 1960-10-18  Age: 60 y.o. MRN: [id number]  CC:  Chief Complaint  Patient presents with  . Shoulder Pain    Pt notes Lt Shoulder pain since apx January, pt denies injury, believes this has started due to a thermal mug she had been using and has since switched away from this. Pt denies loss of ranges of motion, some pain or aching in the joint following yoga or other general exercise/stretching, pain does come and go on occasion she is without pain     HPI Lindsey Sanford presents for   Left shoulder pain: Noted for the past approximately 3 to 4 months No fall/injury.  Noted soreness with use of thermal mug, pain with unscrewing mug - switched to different mug.  R hand dominant. Hx of R shoulder issues - fraying of tendon. No surgery, had PT and injection. Dr. Noemi Chapel.  Not as bad as prior shoulder.  Trying some HEP - helped some, tried Yoga - made it worse. Stopped HEP as those also hurt.  No neck pain, no dysesthesias.   Tx: ibuprofen once per week - has some relief.   No hx of renal dz or PUD.   History Patient Active Problem List   Diagnosis Date Noted  . Primary osteoarthritis of both hands 02/22/2016  . Primary osteoarthritis of both feet 02/22/2016  . Fatigue 02/22/2016  . Osteochondroma of foot, right 02/22/2016  . Anxiety 07/15/2011   Past Medical History:  Diagnosis Date  . Anxiety   . Depression   . Mouth ulcers    most of your life/ due to stress  . Osteopenia    Past Surgical History:  Procedure Laterality Date  . [place]   right foot  . HERNIA REPAIR     s  . TONSILLECTOMY     53-11 years old   Allergies  Allergen Reactions  . Nitrofurantoin Monohyd Macro     Lip swelling   Prior to Admission medications   Medication Sig Start Date End Date Taking? Authorizing Provider  Calcium-Vitamin D (CALTRATE 600 PLUS-VIT D PO) Take by mouth.   Yes [provider]  estradiol  (ESTRACE) 0.1 MG/GM vaginal cream Insert vaginally twice weekly 03/10/20  Yes Marny Lowenstein A, NP  FLUoxetine (PROZAC) 10 MG capsule Take 1 capsule (10 mg total) by mouth daily. 03/30/20  Yes Marny Lowenstein A, NP  hydrOXYzine (ATARAX/VISTARIL) 25 MG tablet Take 0.5-1 tablets (12.5-25 mg total) by mouth at bedtime as needed for anxiety. 05/22/20  Yes Wendie Agreste, MD  Multiple Vitamin (MULTIVITAMIN) tablet Take 1 tablet by mouth daily.   Yes [provider]   Social History   Socioeconomic History  . Marital status: Married    Spouse name: Not on file  . Number of children: Not on file  . Years of education: Not on file  . Highest education level: Not on file  Occupational History  . Not on file  Tobacco Use  . Smoking status: Never Smoker  . Smokeless tobacco: Never Used  Vaping Use  . Vaping Use: Never used  Substance and Sexual Activity  . Alcohol use: Yes    Alcohol/week: 4.0 standard drinks    Types: 4 Glasses of wine per week  . Drug use: No  . Sexual activity: Yes    Birth control/protection: None    Comment: INTERCOUSRE AGE 78, SEXUAL PARTNERS LESS THAN 5  Other Topics  Concern  . Not on file  Social History Narrative  . Not on file   Social Determinants of Health   Financial Resource Strain: Not on file  Food Insecurity: Not on file  Transportation Needs: Not on file  Physical Activity: Not on file  Stress: Not on file  Social Connections: Not on file  Intimate Partner Violence: Not on file    Review of Systems  Per HPI.   Objective:   Vitals:   08/13/20 0826  BP: 140/72  Pulse: 72  Resp: 17  Temp: 98.2 F (36.8 C)  TempSrc: Temporal  SpO2: 99%  Weight: 119 lb (54 kg)  Height: 5\' 7"  (1.702 m)    Physical Exam Constitutional:      General: She is not in acute distress.    Appearance: She is well-developed.  HENT:     Head: Normocephalic and atraumatic.  Cardiovascular:     Rate and Rhythm: Normal rate.  Pulmonary:      Effort: Pulmonary effort is normal.  Musculoskeletal:     Comments: C-spine pain-free range of motion, no bony tenderness, does not reproduce shoulder symptoms  Left shoulder No [place]/AC or clavicle tenderness.  No focal bony tenderness about the left shoulder.  Minimal discomfort at proximal biceps tendon and anterior shoulder.  Full range of motion.  Negative liftoff.  Negative empty can.  Minimal discomfort with Neer, Hawkins.  Minimal discomfort with Speed, Yergason's.  No weakness.  Neurological:     Mental Status: She is alert and oriented to person, place, and time.        Assessment & Plan:  Lindsey Sanford is a 60 y.o. female . Acute pain of left shoulder - Plan: Ambulatory referral to Physical Therapy, meloxicam (MOBIC) 7.5 MG tablet  Impingement syndrome of left shoulder - Plan: Ambulatory referral to Physical Therapy  Suspect component of impingement/bursitis plus or minus biceps tenosynovitis.  Minimal symptoms on exam.  Will initially try short course of meloxicam, range of motion discussed, refer for physical therapy.  Consider orthopedic evaluation if not improving.  Imaging deferred without injury or bony symptoms.  Potential risk of gastrointestinal bleeding discussed with meloxicam and her other medications and warning signs discussed.  RTC precautions given.  Meds ordered this encounter  Medications  . meloxicam (MOBIC) 7.5 MG tablet    Sig: Take 1 tablet (7.5 mg total) by mouth daily.    Dispense:  30 tablet    Refill:  0   Patient Instructions   I suspect you may have a slight component of bursitis in the shoulder and possibly inflammation of the biceps tendon.  Rotator cuff muscles appear to be doing okay at this time.  We can try physical therapy, I have placed that referral so you should be receiving a phone call in the next week or 2.  Meloxicam once per day.  If any stomach upset, dark or bloody stools stop it right away and be seen.  Gentle range of motion is  great, try to avoid repetitive activity.  Continue other exercise.s, as long as they are not causing pain into your shoulder.  Follow-up in next 3-4 weeks -sooner if any worsening symptoms.   Shoulder Impingement Syndrome  Shoulder impingement syndrome is a condition that causes pain when connective tissues (tendons) surrounding the shoulder joint become pinched. These tendons are part of the group of muscles and tissues that help to stabilize the shoulder (rotator cuff). Beneath the rotator cuff is a fluid-filled sac (bursa)  that allows the muscles and tendons to glide smoothly. The bursa may become swollen or irritated (bursitis). Bursitis, swelling in the rotator cuff tendons, or both conditions can decrease how much space is under a bone in the shoulder joint (acromion), resulting in impingement. What are the causes? Shoulder impingement syndrome may be caused by bursitis or swelling of the rotator cuff tendons, which may result from:  Repetitive overhead arm movements.  Falling onto the shoulder.  Weakness in the shoulder muscles. What increases the risk? You may be more likely to develop this condition if you:  Play sports that involve throwing, such as baseball.  Participate in sports such as tennis, volleyball, and swimming.  Work as a Curator, Games developer, or Architect. Some people are also more likely to develop impingement syndrome because of the shape of their acromion bone. What are the signs or symptoms? The main symptom of this condition is pain on the front or side of the shoulder. The pain may:  Get worse when lifting or raising the arm.  Get worse at night.  Wake you up from sleeping.  Feel sharp when the shoulder is moved and then fade to an ache. Other symptoms may include:  Tenderness.  Stiffness.  Inability to raise the arm above shoulder level or behind the body.  Weakness. How is this diagnosed? This condition may be diagnosed based on:  Your  symptoms and medical history.  A physical exam.  Imaging tests, such as: ? X-rays. ? MRI. ? Ultrasound. How is this treated? This condition may be treated by:  Resting your shoulder and avoiding all activities that cause pain or put stress on the shoulder.  Icing your shoulder.  NSAIDs to help reduce pain and swelling.  One or more injections of medicines to numb the area and reduce inflammation.  Physical therapy.  Surgery. This may be needed if nonsurgical treatments have not helped. Surgery may involve repairing the rotator cuff, reshaping the acromion, or removing the bursa. Follow these instructions at home: Managing pain, stiffness, and swelling  If directed, put ice on the injured area. ? Put ice in a plastic bag. ? Place a towel between your skin and the bag. ? Leave the ice on for 20 minutes, 2-3 times a day.   Activity  Rest and return to your normal activities as told by your health care provider. Ask your health care provider what activities are safe for you.  Do exercises as told by your health care provider. General instructions  Do not use any products that contain nicotine or tobacco, such as cigarettes, e-cigarettes, and chewing tobacco. These can delay healing. If you need help quitting, ask your health care provider.  Ask your health care provider when it is safe for you to drive.  Take over-the-counter and prescription medicines only as told by your health care provider.  Keep all follow-up visits as told by your health care provider. This is important. How is this prevented?  Give your body time to rest between periods of activity.  Be safe and responsible while being active. This will help you avoid falls.  Maintain physical fitness, including strength and flexibility. Contact a health care provider if:  Your symptoms have not improved after 1-2 months of treatment and rest.  You cannot lift your arm away from your body. Summary  Shoulder  impingement syndrome is a condition that causes pain when connective tissues (tendons) surrounding the shoulder joint become pinched.  The main symptom of this condition  is pain on the front or side of the shoulder.  This condition is usually treated with rest, ice, and pain medicines as needed. This information is not intended to replace advice given to you by your health care provider. Make sure you discuss any questions you have with your health care provider. Document Revised: 07/20/2018 Document Reviewed: 09/20/2017 Elsevier Patient Education  2021 [place].   Shoulder Pain Many things can cause shoulder pain, including:  An injury to the shoulder.  Overuse of the shoulder.  Arthritis. The source of the pain can be:  Inflammation.  An injury to the shoulder joint.  An injury to a tendon, ligament, or bone. Follow these instructions at home: Pay attention to changes in your symptoms. Let your health care provider know about them. Follow these instructions to relieve your pain. If you have a sling:  Wear the sling as told by your health care provider. Remove it only as told by your health care provider.  Loosen the sling if your fingers tingle, become numb, or turn cold and blue.  Keep the sling clean.  If the sling is not waterproof: ? Do not let it get wet. Remove it to shower or bathe.  Move your arm as little as possible, but keep your hand moving to prevent swelling. Managing pain, stiffness, and swelling  If directed, put ice on the painful area: ? Put ice in a plastic bag. ? Place a towel between your skin and the bag. ? Leave the ice on for 20 minutes, 2-3 times per day. Stop applying ice if it does not help with the pain.  Squeeze a soft ball or a foam pad as much as possible. This helps to keep the shoulder from swelling. It also helps to strengthen the arm.   General instructions  Take over-the-counter and prescription medicines only as told by your  health care provider.  Keep all follow-up visits as told by your health care provider. This is important. Contact a health care provider if:  Your pain gets worse.  Your pain is not relieved with medicines.  New pain develops in your arm, hand, or fingers. Get help right away if:  Your arm, hand, or fingers: ? Tingle. ? Become numb. ? Become swollen. ? Become painful. ? Turn white or blue. Summary  Shoulder pain can be caused by an injury, overuse, or arthritis.  Pay attention to changes in your symptoms. Let your health care provider know about them.  This condition may be treated with a sling, ice, and pain medicines.  Contact your health care provider if the pain gets worse or new pain develops. Get help right away if your arm, hand, or fingers tingle or become numb, swollen, or painful.  Keep all follow-up visits as told by your health care provider. This is important. This information is not intended to replace advice given to you by your health care provider. Make sure you discuss any questions you have with your health care provider. Document Revised: 10/10/2017 Document Reviewed: 10/10/2017 Elsevier Patient Education  2021 [place].      Signed, Merri Ray, MD [facility]

## 2020-08-13 NOTE — Patient Instructions (Signed)
I suspect you may have a slight component of bursitis in the shoulder and possibly inflammation of the biceps tendon.  Rotator cuff muscles appear to be doing okay at this time.  We can try physical therapy, I have placed that referral so you should be receiving a phone call in the next week or 2.  Meloxicam once per day.  If any stomach upset, dark or bloody stools stop it right away and be seen.  Gentle range of motion is great, try to avoid repetitive activity.  Continue other exercise.s, as long as they are not causing pain into your shoulder.  Follow-up in next 3-4 weeks -sooner if any worsening symptoms.   Shoulder Impingement Syndrome  Shoulder impingement syndrome is a condition that causes pain when connective tissues (tendons) surrounding the shoulder joint become pinched. These tendons are part of the group of muscles and tissues that help to stabilize the shoulder (rotator cuff). Beneath the rotator cuff is a fluid-filled sac (bursa) that allows the muscles and tendons to glide smoothly. The bursa may become swollen or irritated (bursitis). Bursitis, swelling in the rotator cuff tendons, or both conditions can decrease how much space is under a bone in the shoulder joint (acromion), resulting in impingement. What are the causes? Shoulder impingement syndrome may be caused by bursitis or swelling of the rotator cuff tendons, which may result from:  Repetitive overhead arm movements.  Falling onto the shoulder.  Weakness in the shoulder muscles. What increases the risk? You may be more likely to develop this condition if you:  Play sports that involve throwing, such as baseball.  Participate in sports such as tennis, volleyball, and swimming.  Work as a Curator, Games developer, or Architect. Some people are also more likely to develop impingement syndrome because of the shape of their acromion bone. What are the signs or symptoms? The main symptom of this condition is pain on the  front or side of the shoulder. The pain may:  Get worse when lifting or raising the arm.  Get worse at night.  Wake you up from sleeping.  Feel sharp when the shoulder is moved and then fade to an ache. Other symptoms may include:  Tenderness.  Stiffness.  Inability to raise the arm above shoulder level or behind the body.  Weakness. How is this diagnosed? This condition may be diagnosed based on:  Your symptoms and medical history.  A physical exam.  Imaging tests, such as: ? X-rays. ? MRI. ? Ultrasound. How is this treated? This condition may be treated by:  Resting your shoulder and avoiding all activities that cause pain or put stress on the shoulder.  Icing your shoulder.  NSAIDs to help reduce pain and swelling.  One or more injections of medicines to numb the area and reduce inflammation.  Physical therapy.  Surgery. This may be needed if nonsurgical treatments have not helped. Surgery may involve repairing the rotator cuff, reshaping the acromion, or removing the bursa. Follow these instructions at home: Managing pain, stiffness, and swelling  If directed, put ice on the injured area. ? Put ice in a plastic bag. ? Place a towel between your skin and the bag. ? Leave the ice on for 20 minutes, 2-3 times a day.   Activity  Rest and return to your normal activities as told by your health care provider. Ask your health care provider what activities are safe for you.  Do exercises as told by your health care provider. General instructions  Do not use any products that contain nicotine or tobacco, such as cigarettes, e-cigarettes, and chewing tobacco. These can delay healing. If you need help quitting, ask your health care provider.  Ask your health care provider when it is safe for you to drive.  Take over-the-counter and prescription medicines only as told by your health care provider.  Keep all follow-up visits as told by your health care provider.  This is important. How is this prevented?  Give your body time to rest between periods of activity.  Be safe and responsible while being active. This will help you avoid falls.  Maintain physical fitness, including strength and flexibility. Contact a health care provider if:  Your symptoms have not improved after 1-2 months of treatment and rest.  You cannot lift your arm away from your body. Summary  Shoulder impingement syndrome is a condition that causes pain when connective tissues (tendons) surrounding the shoulder joint become pinched.  The main symptom of this condition is pain on the front or side of the shoulder.  This condition is usually treated with rest, ice, and pain medicines as needed. This information is not intended to replace advice given to you by your health care provider. Make sure you discuss any questions you have with your health care provider. Document Revised: 07/20/2018 Document Reviewed: 09/20/2017 Elsevier Patient Education  2021 [place].   Shoulder Pain Many things can cause shoulder pain, including:  An injury to the shoulder.  Overuse of the shoulder.  Arthritis. The source of the pain can be:  Inflammation.  An injury to the shoulder joint.  An injury to a tendon, ligament, or bone. Follow these instructions at home: Pay attention to changes in your symptoms. Let your health care provider know about them. Follow these instructions to relieve your pain. If you have a sling:  Wear the sling as told by your health care provider. Remove it only as told by your health care provider.  Loosen the sling if your fingers tingle, become numb, or turn cold and blue.  Keep the sling clean.  If the sling is not waterproof: ? Do not let it get wet. Remove it to shower or bathe.  Move your arm as little as possible, but keep your hand moving to prevent swelling. Managing pain, stiffness, and swelling  If directed, put ice on the painful  area: ? Put ice in a plastic bag. ? Place a towel between your skin and the bag. ? Leave the ice on for 20 minutes, 2-3 times per day. Stop applying ice if it does not help with the pain.  Squeeze a soft ball or a foam pad as much as possible. This helps to keep the shoulder from swelling. It also helps to strengthen the arm.   General instructions  Take over-the-counter and prescription medicines only as told by your health care provider.  Keep all follow-up visits as told by your health care provider. This is important. Contact a health care provider if:  Your pain gets worse.  Your pain is not relieved with medicines.  New pain develops in your arm, hand, or fingers. Get help right away if:  Your arm, hand, or fingers: ? Tingle. ? Become numb. ? Become swollen. ? Become painful. ? Turn white or blue. Summary  Shoulder pain can be caused by an injury, overuse, or arthritis.  Pay attention to changes in your symptoms. Let your health care provider know about them.  This condition may be treated  with a sling, ice, and pain medicines.  Contact your health care provider if the pain gets worse or new pain develops. Get help right away if your arm, hand, or fingers tingle or become numb, swollen, or painful.  Keep all follow-up visits as told by your health care provider. This is important. This information is not intended to replace advice given to you by your health care provider. Make sure you discuss any questions you have with your health care provider. Document Revised: 10/10/2017 Document Reviewed: 10/10/2017 Elsevier Patient Education  2021 [employer].

## 2020-08-25 ENCOUNTER — Ambulatory Visit: Payer: BC Managed Care – PPO | Admitting: Psychology

## 2020-09-08 ENCOUNTER — Ambulatory Visit
Admission: RE | Admit: 2020-09-08 | Discharge: 2020-09-08 | Disposition: A | Discharge status: Home / Self Care | Payer: BC Managed Care – PPO | Source: Ambulatory Visit

## 2020-09-08 ENCOUNTER — Other Ambulatory Visit: Payer: Self-pay

## 2020-09-08 DIAGNOSIS — Z1231 Encounter for screening mammogram for malignant neoplasm of breast: Secondary | ICD-10-CM

## 2020-11-05 ENCOUNTER — Encounter: Payer: Self-pay | Admitting: Family Medicine

## 2020-11-05 DIAGNOSIS — L509 Urticaria, unspecified: Secondary | ICD-10-CM

## 2020-11-05 DIAGNOSIS — T63461A Toxic effect of venom of wasps, accidental (unintentional), initial encounter: Secondary | ICD-10-CM

## 2020-11-10 MED ORDER — EPINEPHRINE 0.3 MG/0.3ML IJ SOAJ: 0.3000 mg | INTRAMUSCULAR | 0 refills | Status: DC | PRN

## 2020-11-26 ENCOUNTER — Ambulatory Visit: Payer: BC Managed Care – PPO | Admitting: Family Medicine

## 2020-11-26 ENCOUNTER — Encounter: Payer: Self-pay | Admitting: Family Medicine

## 2020-11-26 ENCOUNTER — Other Ambulatory Visit: Payer: Self-pay

## 2020-11-26 VITALS — BP 128/66 | HR 65 | Temp 98.2°F | Resp 17 | Ht 67.0 in | Wt 120.0 lb

## 2020-11-26 DIAGNOSIS — G4700 Insomnia, unspecified: Secondary | ICD-10-CM

## 2020-11-26 DIAGNOSIS — F411 Generalized anxiety disorder: Secondary | ICD-10-CM

## 2020-11-26 DIAGNOSIS — M7542 Impingement syndrome of left shoulder: Secondary | ICD-10-CM

## 2020-11-26 DIAGNOSIS — M25512 Pain in left shoulder: Secondary | ICD-10-CM | POA: Diagnosis not present

## 2020-11-26 MED ORDER — FLUOXETINE HCL 20 MG PO CAPS: 20.0000 mg | ORAL_CAPSULE | Freq: Every day | ORAL | 2 refills | Status: DC

## 2020-11-26 NOTE — Progress Notes (Signed)
Subjective:  Patient ID: Lindsey Sanford, female    DOB: 1960/09/06  Age: 60 y.o. MRN: [id number]  CC:  Chief Complaint  Patient presents with   Pain    Shoulder pain Lt side, pt reports this has been constant since apx December, tried PT but no meaningful relief, pt reports meloxicam was very helpful.    Anxiety    Pt here for revisit on anxiety no new concerns, general check in    Depression    Pt also reports had some trouble in July increased Prozac to 20 mg daily and has benefited greatly from the increase. Last Rx by Marny Lowenstein    HPI Lindsey Sanford presents for  concerns above.   L shoulder pain: Discussed at May 5 visit.  3 to 4 months of symptoms.  Previous right shoulder issues treated by orthopedics, Dr. Para March.  Some home exercise programs helped.  Yoga made worse.  Eventually stopped home exercise program as it is hurt.  Impingement/bursitis plus or minus biceps tenosynovitis suspected.  Referred for physical therapy, short course of meloxicam with option of Ortho if not improving.  Imaging initially deferred.  Feels like shoulder about the same. Pain 2-4/10 but daily. Moving shoulder back is sore. Thought to be bicep by PT - had about 6 sessions. Took mobic for a month - some improvement on med, then returned.  Would like to see Dr. Tamera Punt  Anxiety/depression Previously on low-dose fluoxetine 10 mg, increased to 20 mg in July based on increased symptoms.  Has reestablished with Zella Ball. Going well.    Exercise, try to be outside was helpful for her in the past.  Rarely uses hydroxyzine for sleep.  Feels better on '20mg'$  dose, now on 2 weeks. No new side effects on higher dose. Rare need for hsydroxyzine - would like to remain on same regimen.  Depression screen [facility] 2/9 11/26/2020 08/13/2020 05/22/2020 11/20/2019 10/24/2019  Decreased Interest 1 0 0 0 0  Down, Depressed, Hopeless '1 1 1 '$ 0 0  PHQ - 2 Score '2 1 1 '$ 0 0  Altered sleeping 1 - - - -  Tired, decreased  energy 1 - - - -  Change in appetite 1 - - - -  Feeling bad or failure about yourself  1 - - - -  Trouble concentrating 0 - - - -  Moving slowly or fidgety/restless 0 - - - -  Suicidal thoughts 0 - - - -  PHQ-9 Score 6 - - - -  Difficult doing work/chores Somewhat difficult - - - -   GAD 7 : Generalized Anxiety Score 11/26/2020 05/22/2020 11/20/2019 05/17/2019  Nervous, Anxious, on Edge '1 1 2 1  '$ Control/stop worrying 1 0 1 0  Worry too much - different things 0 '1 2 1  '$ Trouble relaxing '2 2 2 1  '$ Restless '2 2 1 '$ -  Easily annoyed or irritable '1 1 1 '$ 0  Afraid - awful might happen '1 1 1 '$ 0  Total GAD 7 Score '8 8 10 '$ -      History Patient Active Problem List   Diagnosis Date Noted   Primary osteoarthritis of both hands 02/22/2016   Primary osteoarthritis of both feet 02/22/2016   Fatigue 02/22/2016   Osteochondroma of foot, right 02/22/2016   Anxiety 07/15/2011   Past Medical History:  Diagnosis Date   Anxiety    Depression    Mouth ulcers    most of your life/ due to stress  Osteopenia    Past Surgical History:  Procedure Laterality Date   FOOT SURGERY  1985   right foot   HERNIA REPAIR     s   TONSILLECTOMY     6-50 years old   Allergies  Allergen Reactions   Nitrofurantoin Monohyd Macro     Lip swelling   Prior to Admission medications   Medication Sig Start Date End Date Taking? Authorizing Provider  Calcium-Vitamin D (CALTRATE 600 PLUS-VIT D PO) Take by mouth.   Yes [provider]  EPINEPHrine (EPIPEN 2-PAK) 0.3 mg/0.3 mL IJ SOAJ injection Inject 0.3 mg into the muscle as needed for anaphylaxis. 11/10/20  Yes Wendie Agreste, MD  estradiol (ESTRACE) 0.1 MG/GM vaginal cream Insert vaginally twice weekly 03/10/20  Yes Marny Lowenstein A, NP  FLUoxetine (PROZAC) 10 MG capsule Take 1 capsule (10 mg total) by mouth daily. 03/30/20  Yes Marny Lowenstein A, NP  hydrOXYzine (ATARAX/VISTARIL) 25 MG tablet Take 0.5-1 tablets (12.5-25 mg total) by mouth at bedtime  as needed for anxiety. 05/22/20  Yes Wendie Agreste, MD  Multiple Vitamin (MULTIVITAMIN) tablet Take 1 tablet by mouth daily.   Yes [provider]  meloxicam (MOBIC) 7.5 MG tablet Take 1 tablet (7.5 mg total) by mouth daily. Patient not taking: Reported on 11/26/2020 08/13/20   Wendie Agreste, MD   Social History   Socioeconomic History   Marital status: Married    Spouse name: Not on file   Number of children: Not on file   Years of education: Not on file   Highest education level: Not on file  Occupational History   Not on file  Tobacco Use   Smoking status: Never   Smokeless tobacco: Never  Vaping Use   Vaping Use: Never used  Substance and Sexual Activity   Alcohol use: Yes    Alcohol/week: 4.0 standard drinks    Types: 4 Glasses of wine per week   Drug use: No   Sexual activity: Yes    Birth control/protection: None    Comment: INTERCOUSRE AGE 21, SEXUAL PARTNERS LESS THAN 5  Other Topics Concern   Not on file  Social History Narrative   Not on file   Social Determinants of Health   Financial Resource Strain: Not on file  Food Insecurity: Not on file  Transportation Needs: Not on file  Physical Activity: Not on file  Stress: Not on file  Social Connections: Not on file  Intimate Partner Violence: Not on file    Review of Systems  Per HPI.  Objective:   Vitals:   11/26/20 1049  BP: 128/66  Pulse: 65  Resp: 17  Temp: 98.2 F (36.8 C)  TempSrc: Temporal  SpO2: 97%  Weight: 120 lb (54.4 kg)  Height: '5\' 7"'$  (1.702 m)    Physical Exam Vitals reviewed.  Constitutional:      General: She is not in acute distress.    Appearance: Normal appearance. She is well-developed.  HENT:     Head: Normocephalic and atraumatic.  Cardiovascular:     Rate and Rhythm: Normal rate.  Pulmonary:     Effort: Pulmonary effort is normal.  Musculoskeletal:     Comments: C-spine pain-free range of motion, does not reproduce shoulder/arm symptoms.  Left  shoulder [place], AC, clavicle nontender, full range of motion of shoulder.  No focal bony tenderness.  Slight discomfort with extension, internal rotation, locates discomfort to anterior shoulder.  Full rotator cuff strength but slight discomfort with empty  can and resisted external rotation.  Slight discomfort with both Neer's and Hawkins, negative Yergason's, speeds.  Minimal discomfort over the bicipital groove.  Neurovascular intact distally.  Neurological:     Mental Status: She is alert and oriented to person, place, and time.  Psychiatric:        Mood and Affect: Mood normal.        Behavior: Behavior normal.        Thought Content: Thought content normal.       Assessment & Plan:  Lindsey Sanford is a 60 y.o. female . Impingement syndrome of left shoulder - Plan: Ambulatory referral to Orthopedic Surgery Left shoulder pain, unspecified chronicity - Plan: Ambulatory referral to Orthopedic Surgery  -Possible component of impingement syndrome, rotator cuff tendinosis.  May have component of bicipital tendinosis but minimal symptoms on exam.  Refer to orthopedics for further evaluation and treatment.  Ok to continue home exercise program from PT.  Generalized anxiety disorder - Plan: FLUoxetine (PROZAC) 20 MG capsule Insomnia, unspecified type - Plan: FLUoxetine (PROZAC) 20 MG capsule  -Improved on higher dose of fluoxetine, continue same along with counseling.  77-monthfollow-up.  Sooner if new or worsening symptoms.  Meds ordered this encounter  Medications   FLUoxetine (PROZAC) 20 MG capsule    Sig: Take 1 capsule (20 mg total) by mouth daily.    Dispense:  90 capsule    Refill:  2   Patient Instructions  I will refer you to Dr. CTamera Puntfor your shoulder. Ok to continue exercises from PT until that visit if needed. I am glad higher dose of fluoxetine has been working well. No changes for now. Take care.      Signed,   JMerri Ray MD [facility] 11/26/20 11:53 AM

## 2020-11-26 NOTE — Patient Instructions (Signed)
I will refer you to Dr. Tamera Punt for your shoulder. Ok to continue exercises from PT until that visit if needed. I am glad higher dose of fluoxetine has been working well. No changes for now. Take care.

## 2021-01-20 ENCOUNTER — Ambulatory Visit: Payer: BC Managed Care – PPO | Admitting: Allergy

## 2021-01-21 ENCOUNTER — Ambulatory Visit (INDEPENDENT_AMBULATORY_CARE_PROVIDER_SITE_OTHER): Payer: BC Managed Care – PPO | Admitting: Registered Nurse

## 2021-01-21 ENCOUNTER — Other Ambulatory Visit: Payer: Self-pay

## 2021-01-21 DIAGNOSIS — Z23 Encounter for immunization: Secondary | ICD-10-CM

## 2021-01-21 NOTE — Progress Notes (Signed)
Patient came in as a nurse visit to get a flu vaccine in right deltoid. Patient tolerated the vaccine well.

## 2021-02-17 ENCOUNTER — Ambulatory Visit: Payer: BC Managed Care – PPO | Admitting: Allergy

## 2021-02-17 ENCOUNTER — Other Ambulatory Visit: Payer: Self-pay

## 2021-02-17 ENCOUNTER — Encounter: Payer: Self-pay | Admitting: Allergy

## 2021-02-17 VITALS — BP 140/72 | HR 73 | Temp 97.5°F | Resp 16 | Ht 67.0 in | Wt 123.4 lb

## 2021-02-17 DIAGNOSIS — T782XXA Anaphylactic shock, unspecified, initial encounter: Secondary | ICD-10-CM | POA: Insufficient documentation

## 2021-02-17 DIAGNOSIS — T782XXD Anaphylactic shock, unspecified, subsequent encounter: Secondary | ICD-10-CM

## 2021-02-17 DIAGNOSIS — T63481A Toxic effect of venom of other arthropod, accidental (unintentional), initial encounter: Secondary | ICD-10-CM | POA: Insufficient documentation

## 2021-02-17 DIAGNOSIS — T63481D Toxic effect of venom of other arthropod, accidental (unintentional), subsequent encounter: Secondary | ICD-10-CM

## 2021-02-17 MED ORDER — EPINEPHRINE 0.3 MG/0.3ML IJ SOAJ: 0.3000 mg | INTRAMUSCULAR | 1 refills | Status: AC | PRN

## 2021-02-17 NOTE — Patient Instructions (Addendum)
Stinging insects Continue to avoid stinging insects. I have prescribed epinephrine injectable device and demonstrated proper use. For mild symptoms you can take over the counter antihistamines such as Benadryl and monitor symptoms closely. If symptoms worsen or if you have severe symptoms including breathing issues, throat closure, significant swelling, whole body hives, severe diarrhea and vomiting, lightheadedness then inject epinephrine and seek immediate medical care afterwards. Emergency action plan given. Recommend getting a medical alert bracelet as well.   Handout given.  If you have another sting with similar symptoms then: Take benadryl as needed.  Start taking zyrtec 10mg  twice a day. Start taking famotidine 20mg  twice a day.  Get bloodwork If negative will schedule for testing in our [facility] office - which is done once a month due to clinical history. If positive then will recommend starting allergy injections.  We are ordering labs, so please allow 1-2 weeks for the results to come back. With the newly implemented Cures Act, the labs might be visible to you at the same time that they become visible to me. However, I will not address the results until all of the results are back, so please be patient.  In the meantime, continue recommendations in your patient instructions, including avoidance measures (if applicable), until you hear from me.  Follow up in 1 year or sooner if needed.

## 2021-02-17 NOTE — Progress Notes (Signed)
New Patient Note  RE: Lindsey Sanford MRN: [id number] DOB: 1960-08-10 Date of Office Visit: 02/17/2021  Consult requested by: Wendie Agreste, MD Primary care provider: Wendie Agreste, MD  Chief Complaint: Allergic Reaction (Wasp sting - broke out in hives and was itching all over. Patient states that she did not see the insect but felt it and it hurt really bad. She also saw something fly away but is unsure as to what it was.)  History of Present Illness: I had the pleasure of seeing Lindsey Sanford for initial evaluation at the [facility] on 02/17/2021. She is a 60 y.o. female, who is referred here by Wendie Agreste, MD for the evaluation of hymenoptera allergy.  Patient got stung by something on July 28th on her hand. She was walking outdoors on a trail. She felt the sting and saw something fly off. Patient states that it was very painful. She washed it off with baking soda and put some benadryl cream. She went to run errands and then 30-60 minutes afterwards she noted whole body rash which was itching. Denies any other associated symptoms.   Patient took benadryl and hydroxyzine which helped.  Symptoms resolved within 3-4 hours and did not require prednisone.   Patient had prior stings with no issues.  Denies any changes in diet, meds, personal care products or recent infections.  Patient does have an epipen currently which she did not have to use.   Patient likes to walk outdoors in the woods.   Reviewed images on the phone - whole body rash noted.   Referral note: "Hives after recent wasp sting. Epipen rx given if needed, eval and treat."  Assessment and Plan: [name] is a 60 y.o. female with: Anaphylaxis due to hymenoptera venom Developed whole body pruritic rash after a stinging insect stung her on her hand. Symptoms resolved with antihistamines over a few days. Not required prednisone or epipen. Denies any other associated symptoms. Prior stings  only caused localized reactions. Continue to avoid stinging insects - informational handout given. I have prescribed epinephrine injectable device and demonstrated proper use. For mild symptoms you can take over the counter antihistamines such as Benadryl and monitor symptoms closely. If symptoms worsen or if you have severe symptoms including breathing issues, throat closure, significant swelling, whole body hives, severe diarrhea and vomiting, lightheadedness then inject epinephrine and seek immediate medical care afterwards. Emergency action plan given. Recommend getting a medical alert bracelet and teaching people around you how to use the Epinephrine device.  If you have another sting with similar mild symptoms then: Take benadryl as needed.  Start taking zyrtec 10mg  twice a day. Start taking famotidine 20mg  twice a day. Get bloodwork If negative will schedule for testing in the [facility] office - which is done once a month due to clinical history. If positive then will recommend starting allergy injections as she is likes to be outdoors.  Return in about 1 year (around 02/17/2022).  Meds ordered this encounter  Medications   EPINEPHrine (AUVI-Q) 0.3 mg/0.3 mL IJ SOAJ injection    Sig: Inject 0.3 mg into the muscle as needed for anaphylaxis.    Dispense:  4 each    Refill:  1    C: [id number]    Lab Orders         Allergen Hymenoptera Panel         Allergen Fire Ant         Tryptase  Other allergy screening: Asthma: no Rhino conjunctivitis:  Some mild symptoms and uses Flonase prn.  Food allergy: no Medication allergy: yes Macrobid - lip swelling Urticaria:  Sometimes breaks out and no known triggers.  One time it was after an infection. Eczema:no History of recurrent infections suggestive of immunodeficency: no  Diagnostics: None.   Past Medical History: Patient Active Problem List   Diagnosis Date Noted   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    Primary osteoarthritis of both hands 02/22/2016   Primary osteoarthritis of both feet 02/22/2016   Fatigue 02/22/2016   Osteochondroma of foot, right 02/22/2016   Anxiety 07/15/2011   Past Medical History:  Diagnosis Date   Anxiety    Depression    Mouth ulcers    most of your life/ due to stress   Osteopenia    Past Surgical History: Past Surgical History:  Procedure Laterality Date   FOOT SURGERY  1985   right foot   HERNIA REPAIR     s   TONSILLECTOMY     46-100 years old   Medication List:  Current Outpatient Medications  Medication Sig Dispense Refill   Calcium-Vitamin D (CALTRATE 600 PLUS-VIT D PO) Take by mouth.     EPINEPHrine (AUVI-Q) 0.3 mg/0.3 mL IJ SOAJ injection Inject 0.3 mg into the muscle as needed for anaphylaxis. 4 each 1   EPINEPHrine (EPIPEN 2-PAK) 0.3 mg/0.3 mL IJ SOAJ injection Inject 0.3 mg into the muscle as needed for anaphylaxis. 1 each 0   estradiol (ESTRACE) 0.1 MG/GM vaginal cream Insert vaginally twice weekly 42.5 g 11   FLUoxetine (PROZAC) 20 MG capsule Take 1 capsule (20 mg total) by mouth daily. 90 capsule 2   hydrOXYzine (ATARAX/VISTARIL) 25 MG tablet Take 0.5-1 tablets (12.5-25 mg total) by mouth at bedtime as needed for anxiety. 90 tablet 1   Multiple Vitamin (MULTIVITAMIN) tablet Take 1 tablet by mouth daily.     No current facility-administered medications for this visit.   Allergies: Allergies  Allergen Reactions   Nitrofurantoin Monohyd Macro     Lip swelling   Social History: Social History   Socioeconomic History   Marital status: Married    Spouse name: Not on file   Number of children: Not on file   Years of education: Not on file   Highest education level: Not on file  Occupational History   Not on file  Tobacco Use   Smoking status: Never   Smokeless tobacco: Never  Vaping Use   Vaping Use: Never used  Substance and Sexual Activity   Alcohol use: Yes    Alcohol/week: 4.0 standard drinks    Types: 4 Glasses of  wine per week   Drug use: No   Sexual activity: Yes    Birth control/protection: None    Comment: INTERCOUSRE AGE 61, SEXUAL PARTNERS LESS THAN 5  Other Topics Concern   Not on file  Social History Narrative   Not on file   Social Determinants of Health   Financial Resource Strain: Not on file  Food Insecurity: Not on file  Transportation Needs: Not on file  Physical Activity: Not on file  Stress: Not on file  Social Connections: Not on file   Lives in a 70+ year old house. Smoking: denies Occupation: retired  Programme researcher, broadcasting/film/video HistoryFreight forwarder in the house: yes Carpet in the family room: yes Carpet in the bedroom: yes Heating: electric Cooling: central Pet: no  Family History: Family History  Problem Relation Age of  Onset   Allergic rhinitis Mother        food allergies   Diabetes Father    Eczema Father    Heart disease Father    Eczema Sister    Breast cancer Paternal Aunt    Colon cancer Neg Hx    Rectal cancer Neg Hx    Review of Systems  Constitutional:  Negative for appetite change, chills, fever and unexpected weight change.  HENT:  Negative for congestion and rhinorrhea.   Eyes:  Negative for itching.  Respiratory:  Negative for cough, chest tightness, shortness of breath and wheezing.   Cardiovascular:  Negative for chest pain.  Gastrointestinal:  Negative for abdominal pain.  Genitourinary:  Negative for difficulty urinating.  Skin:  Negative for rash.  Neurological:  Negative for headaches.   Objective: BP 140/72   Pulse 73   Temp (!) 97.5 F (36.4 C) (Temporal)   Resp 16   Ht 5\' 7"  (1.702 m)   Wt 123 lb 6.4 oz (56 kg)   LMP 09/11/2013   SpO2 100%   BMI 19.33 kg/m  Body mass index is 19.33 kg/m. Physical Exam Vitals and nursing note reviewed.  Constitutional:      Appearance: Normal appearance. She is well-developed.  HENT:     Head: Normocephalic and atraumatic.     Right Ear: Tympanic membrane and external ear normal.      Left Ear: Tympanic membrane and external ear normal.     Nose: Nose normal.     Mouth/Throat:     Mouth: Mucous membranes are moist.     Pharynx: Oropharynx is clear.  Eyes:     Conjunctiva/sclera: Conjunctivae normal.  Cardiovascular:     Rate and Rhythm: Normal rate and regular rhythm.     Heart sounds: Normal heart sounds. No murmur heard.   No friction rub. No gallop.  Pulmonary:     Effort: Pulmonary effort is normal.     Breath sounds: Normal breath sounds. No wheezing, rhonchi or rales.  Musculoskeletal:     Cervical back: Neck supple.  Skin:    General: Skin is warm.     Findings: No rash.  Neurological:     Mental Status: She is alert and oriented to person, place, and time.  Psychiatric:        Behavior: Behavior normal.  The plan was reviewed with the patient/family, and all questions/concerned were addressed.  It was my pleasure to see Anjelique today and participate in her care. Please feel free to contact me with any questions or concerns.  Sincerely,  Rexene Alberts, DO Allergy & Immunology  [facility] office: [place] office: [phone]

## 2021-02-17 NOTE — Assessment & Plan Note (Addendum)
Developed whole body pruritic rash after a stinging insect stung her on her hand. Symptoms resolved with antihistamines over a few days. Not required prednisone or epipen. Denies any other associated symptoms. Prior stings only caused localized reactions. . Continue to avoid stinging insects - informational handout given. . I have prescribed epinephrine injectable device and demonstrated proper use. For mild symptoms you can take over the counter antihistamines such as Benadryl and monitor symptoms closely. If symptoms worsen or if you have severe symptoms including breathing issues, throat closure, significant swelling, whole body hives, severe diarrhea and vomiting, lightheadedness then inject epinephrine and seek immediate medical care afterwards. . Emergency action plan given. o Recommend getting a medical alert bracelet and teaching people around you how to use the Epinephrine device.  . If you have another sting with similar mild symptoms then: o Take benadryl as needed.  o Start taking zyrtec 10mg  twice a day. o Start taking famotidine 20mg  twice a day. . Get bloodwork o If negative will schedule for testing in the [facility] office - which is done once a month due to clinical history. o If positive then will recommend starting allergy injections as Lindsey Sanford is likes to be outdoors.

## 2021-02-25 LAB — ALLERGEN HYMENOPTERA PANEL
Bumblebee: <0.1 kU/L
Honeybee IgE: 0.23 kU/L — AB
Hornet, White Face, IgE: 0.43 kU/L — AB
Hornet, Yellow, IgE: 0.13 kU/L — AB
Paper Wasp IgE: 1.22 kU/L — AB
Yellow Jacket, IgE: 1.04 kU/L — AB

## 2021-02-25 LAB — TRYPTASE: Tryptase: 2.7 ug/L (ref 2.2–13.2)

## 2021-02-25 LAB — ALLERGEN FIRE ANT: I070-IgE Fire Ant (Invicta): <0.1 kU/L

## 2021-04-29 ENCOUNTER — Telehealth (INDEPENDENT_AMBULATORY_CARE_PROVIDER_SITE_OTHER): Payer: BC Managed Care – PPO | Admitting: Family Medicine

## 2021-04-29 ENCOUNTER — Encounter: Payer: Self-pay | Admitting: Family Medicine

## 2021-04-29 DIAGNOSIS — L821 Other seborrheic keratosis: Secondary | ICD-10-CM | POA: Insufficient documentation

## 2021-04-29 DIAGNOSIS — U071 COVID-19: Secondary | ICD-10-CM | POA: Diagnosis not present

## 2021-04-29 DIAGNOSIS — L719 Rosacea, unspecified: Secondary | ICD-10-CM | POA: Insufficient documentation

## 2021-04-29 DIAGNOSIS — L409 Psoriasis, unspecified: Secondary | ICD-10-CM | POA: Insufficient documentation

## 2021-04-29 DIAGNOSIS — D225 Melanocytic nevi of trunk: Secondary | ICD-10-CM | POA: Insufficient documentation

## 2021-04-29 DIAGNOSIS — Z87898 Personal history of other specified conditions: Secondary | ICD-10-CM | POA: Insufficient documentation

## 2021-04-29 NOTE — Patient Instructions (Signed)
Sorry to hear that you are sick!  However I hope that symptoms continue to improve as they have today.  Continue Tylenol as needed for headache or body aches, make sure to drink plenty of fluids, Mucinex if needed for cough.  If that is not sufficient let me know and I can call something else and for cough.  If you decide to take antivirals, let me know sooner than later as those do need to be started within 5 days.  Get better soon!  Your employer may have specific requirements for return to work, but this information is provided from the CDC. There is a link provided below for more information if needed.   Everyone who has presumed or confirmed COVID-19 should stay home and isolate from other people for at least 5 full days (day 0 is the first day of symptoms or the date of the day of the positive viral test for asymptomatic persons). You can end isolation after 5 full days if you are fever-free for 24 hours without the use of fever-reducing medication and your other symptoms have improved (Loss of taste and smell may persist for weeks or months after recovery and need not delay the end of isolation). You should continue to wear a well-fitting mask around others at home and in public for 5 additional days (day 6 through day 10) after the end of your 5-day isolation period. If you are unable to wear a mask when around others, you should continue to isolate for a full 10 days. Avoid people who have weakened immune systems or are more likely to get very sick from COVID-19, and nursing homes and other high-risk settings, until after at least 10 days.  If you continue to have fever or your other symptoms have not improved after 5 days of isolation, you should wait to end your isolation until you are fever-free for 24 hours without the use of fever-reducing medication and your other symptoms have improved. Continue to wear a well-fitting mask through day 10.    [email or web address].[email or web address]

## 2021-04-29 NOTE — Progress Notes (Signed)
Virtual Visit via Video Note  I connected with Lindsey Sanford on 04/29/21 at 5:11 PM by a video enabled telemedicine application and verified that I am speaking with the correct person using two identifiers.  Patient location: home, by self My location: office - [place].    I discussed the limitations, risks, security and privacy concerns of performing an evaluation and management service by telephone and the availability of in person appointments. I also discussed with the patient that there may be a patient responsible charge related to this service. The patient expressed understanding and agreed to proceed, consent obtained  Chief complaint:  Chief Complaint  Patient presents with   Covid Positive    Cough, head congestion, headaches, body aches, fever 101    History of Present Illness: Lindsey Sanford is a 61 y.o. female  Covid 74 infection: Husband with symptoms 3 days ago, positive test 2 d ago.   She started with sx's yesterday - body aches, chills, cough, chest soreness, HA. Positive covid PCR at [facility] yesterday. Chest not sore today, slight improved cough. No cp, dyspnea. No confusion, drinking fluids.  Tylenol for HA, no other meds.  Covid risk score.  Has received covid vaccine and 2 boosters - most recent in 12/2020 - not bivalent.  Antivirals discussed -  as better, defers at this time  - will let me know in next day.     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   Primary osteoarthritis of both hands 02/22/2016   Primary osteoarthritis of both feet 02/22/2016   Fatigue 02/22/2016   Osteochondroma of foot, right 02/22/2016   Anxiety 07/15/2011   Past Medical History:  Diagnosis Date   Anxiety    Depression    Mouth ulcers    most of your  life/ due to stress   Osteopenia    Past Surgical History:  Procedure Laterality Date   FOOT SURGERY  1985   right foot   HERNIA REPAIR     s   TONSILLECTOMY     79-60 years old   Allergies  Allergen Reactions   Nitrofurantoin Monohyd Macro     Lip swelling   Prior to Admission medications   Medication Sig Start Date End Date Taking? Authorizing Provider  Calcium-Vitamin D (CALTRATE 600 PLUS-VIT D PO) Take by mouth.   Yes [provider]  EPINEPHrine (AUVI-Q) 0.3 mg/0.3 mL IJ SOAJ injection Inject 0.3 mg into the muscle as needed for anaphylaxis. 02/17/21  Yes Garnet Sierras, DO  EPINEPHrine (EPIPEN 2-PAK) 0.3 mg/0.3 mL IJ SOAJ injection Inject 0.3 mg into the muscle as needed for anaphylaxis. 11/10/20  Yes Wendie Agreste, MD  estradiol (ESTRACE) 0.1 MG/GM vaginal cream Insert vaginally twice weekly 03/10/20  Yes Marny Lowenstein A, NP  FLUoxetine (PROZAC) 20 MG capsule Take 1 capsule (20 mg total) by mouth daily. 11/26/20  Yes Wendie Agreste, MD  hydrOXYzine (ATARAX/VISTARIL) 25 MG tablet Take 0.5-1 tablets (12.5-25 mg total) by mouth at bedtime as needed for anxiety. 05/22/20  Yes Wendie Agreste, MD  Multiple Vitamin (MULTIVITAMIN) tablet Take 1 tablet by mouth daily.   Yes [provider]   Social History   Socioeconomic History   Marital status: Married    Spouse name: Not on file  Number of children: Not on file   Years of education: Not on file   Highest education level: Not on file  Occupational History   Not on file  Tobacco Use   Smoking status: Never   Smokeless tobacco: Never  Vaping Use   Vaping Use: Never used  Substance and Sexual Activity   Alcohol use: Yes    Alcohol/week: 4.0 standard drinks    Types: 4 Glasses of wine per week   Drug use: No   Sexual activity: Yes    Birth control/protection: None    Comment: INTERCOUSRE AGE 61, SEXUAL PARTNERS LESS THAN 5  Other Topics Concern   Not on file  Social History Narrative   Not on  file   Social Determinants of Health   Financial Resource Strain: Not on file  Food Insecurity: Not on file  Transportation Needs: Not on file  Physical Activity: Not on file  Stress: Not on file  Social Connections: Not on file  Intimate Partner Violence: Not on file    Observations/Objective: There were no vitals filed for this visit. Patient reported on call: Temp: 101 at 4pm HR 90, O2sat 99% Nontoxic appearance on video.  Speaking in full sentences with no respiratory distress, coherent responses, euthymic mood.  All questions were answered with understanding plan expressed.  Assessment and Plan: COVID-19 virus infection Day 1 of symptoms, low risk of complications by [id number] has been vaccinated and boosted x2 although not with bivalent booster.  Symptoms have improved some today without red flag symptoms on history and nontoxic appearance on video.  Symptomatic care with Tylenol, Mucinex, fluids and rest discussed.  Antiviral options discussed, declined at this time but will let me know tomorrow if she changes her mind.  Potential side effects and risks including risk of rebound COVID discussed with antivirals.  ER precautions given.  Masking, isolation precautions per CDC discussed.  Follow Up Instructions: As needed with ER precautions given.   I discussed the assessment and treatment plan with the patient. The patient was provided an opportunity to ask questions and all were answered. The patient agreed with the plan and demonstrated an understanding of the instructions.   The patient was advised to call back or seek an in-person evaluation if the symptoms worsen or if the condition fails to improve as anticipated.  Wendie Agreste, MD

## 2021-05-28 ENCOUNTER — Ambulatory Visit: Payer: BC Managed Care – PPO | Admitting: Family Medicine

## 2021-06-03 ENCOUNTER — Encounter: Payer: Self-pay | Admitting: Family Medicine

## 2021-06-03 ENCOUNTER — Ambulatory Visit: Payer: BC Managed Care – PPO | Admitting: Family Medicine

## 2021-06-03 VITALS — BP 122/70 | HR 73 | Temp 98.0°F | Resp 16 | Ht 67.0 in | Wt 123.4 lb

## 2021-06-03 DIAGNOSIS — L603 Nail dystrophy: Secondary | ICD-10-CM | POA: Diagnosis not present

## 2021-06-03 DIAGNOSIS — G4700 Insomnia, unspecified: Secondary | ICD-10-CM | POA: Diagnosis not present

## 2021-06-03 DIAGNOSIS — Z1329 Encounter for screening for other suspected endocrine disorder: Secondary | ICD-10-CM | POA: Diagnosis not present

## 2021-06-03 DIAGNOSIS — F411 Generalized anxiety disorder: Secondary | ICD-10-CM | POA: Diagnosis not present

## 2021-06-03 DIAGNOSIS — E611 Iron deficiency: Secondary | ICD-10-CM

## 2021-06-03 DIAGNOSIS — L679 Hair color and hair shaft abnormality, unspecified: Secondary | ICD-10-CM | POA: Diagnosis not present

## 2021-06-03 LAB — TSH: TSH: 1.39 u[IU]/mL (ref 0.35–5.50)

## 2021-06-03 LAB — CBC WITH DIFFERENTIAL/PLATELET
Basophils Absolute: 0 10*3/uL (ref 0.0–0.1)
Basophils Relative: 0.8 % (ref 0.0–3.0)
Eosinophils Absolute: 0.2 10*3/uL (ref 0.0–0.7)
Eosinophils Relative: 3.8 % (ref 0.0–5.0)
HCT: 34.5 % — ABNORMAL LOW (ref 36.0–46.0)
Hemoglobin: 11.3 g/dL — ABNORMAL LOW (ref 12.0–15.0)
Lymphocytes Relative: 29.7 % (ref 12.0–46.0)
Lymphs Abs: 1.7 10*3/uL (ref 0.7–4.0)
MCHC: 32.8 g/dL (ref 30.0–36.0)
MCV: 81.9 fl (ref 78.0–100.0)
Monocytes Absolute: 0.5 10*3/uL (ref 0.1–1.0)
Monocytes Relative: 7.8 % (ref 3.0–12.0)
Neutro Abs: 3.3 10*3/uL (ref 1.4–7.7)
Neutrophils Relative %: 57.9 % (ref 43.0–77.0)
Platelets: 320 10*3/uL (ref 150.0–400.0)
RBC: 4.22 Mil/uL (ref 3.87–5.11)
RDW: 15.2 % (ref 11.5–15.5)
WBC: 5.8 10*3/uL (ref 4.0–10.5)

## 2021-06-03 LAB — FERRITIN: Ferritin: 5.7 ng/mL — ABNORMAL LOW (ref 10.0–291.0)

## 2021-06-03 MED ORDER — FLUOXETINE HCL 20 MG PO CAPS: 20.0000 mg | ORAL_CAPSULE | Freq: Every day | ORAL | 2 refills | Status: DC

## 2021-06-03 MED ORDER — HYDROXYZINE HCL 25 MG PO TABS: 12.5000 mg | ORAL_TABLET | Freq: Every evening | ORAL | 1 refills | Status: DC | PRN

## 2021-06-03 NOTE — Patient Instructions (Signed)
No change in medications today.  I will check iron test, blood count, thyroid test but if those are abnormal can follow-up to discuss other testing or treatments.  If you continue to notice brittle nails or hair issues and these labs are normal, please follow-up and we can discuss other potential causes.  Thank you for coming in today!

## 2021-06-03 NOTE — Progress Notes (Signed)
Subjective:  Patient ID: Lindsey Sanford, female    DOB: 31-Dec-1960  Age: 61 y.o. MRN: [id number]  CC:  Chief Complaint  Patient presents with   Depression    Pt here for check in notes medication is doing well no request to change    Anxiety    Pt reports doing well, no need for change at this time     HPI [name] presents for   Anxiety/depression Treated fluoxetine 10 mg, then increase to 20 mg in July with increased symptoms.  Was improving at that dose when last discussed in August and had to reestablish with her therapist, Zella Ball.  Rare need for hydroxyzine.  Still feels like meds are working well. Continues counseling with Sharyn Lull.  No new side effects.  Best she has felt this time of year in years.  Has moved through adjustment of caregiving and leaving job.  Hydroxyzine only on occasion - 1/2 pill if needed.  Taking hair. Nail vitamin for more brittle nails past few months, some hair coming out more possible.  Told had low iron when donating blood attempt.  Added some MVI, iron rich foods and ok on recheck.  No temp intolerance.  Wt Readings from Last 3 Encounters:  06/03/21 123 lb 6.4 oz (56 kg)  02/17/21 123 lb 6.4 oz (56 kg)  11/26/20 120 lb (54.4 kg)   Lab Results  Component Value Date   WBC 5.1 03/10/2020   HGB 13.2 03/10/2020   HCT 38.1 03/10/2020   MCV 88.2 03/10/2020   PLT 301 03/10/2020   Lab Results  Component Value Date   TSH 1.130 09/05/2018      Depression screen PHQ 2/9 06/03/2021 11/26/2020 08/13/2020 05/22/2020 11/20/2019  Decreased Interest 0 1 0 0 0  Down, Depressed, Hopeless 0 1 1 1  0  PHQ - 2 Score 0 2 1 1  0  Altered sleeping 1 1 - - -  Tired, decreased energy 0 1 - - -  Change in appetite 0 1 - - -  Feeling bad or failure about yourself  0 1 - - -  Trouble concentrating 0 0 - - -  Moving slowly or fidgety/restless 0 0 - - -  Suicidal thoughts 0 0 - - -  PHQ-9 Score 1 6 - - -  Difficult doing work/chores - Somewhat  difficult - - -   GAD 7 : Generalized Anxiety Score 06/03/2021 11/26/2020 05/22/2020 11/20/2019  Nervous, Anxious, on Edge 1 1 1 2   Control/stop worrying 0 1 0 1  Worry too much - different things 0 0 1 2  Trouble relaxing 1 2 2 2   Restless 1 2 2 1   Easily annoyed or irritable 1 1 1 1   Afraid - awful might happen 0 1 1 1   Total GAD 7 Score 4 8 8 10     Treated for COVID-19 infection in January.  Decided against antivirals at that time.  Symptoms have improved. Back to normal, but slow return of energy over a few weeks.   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   Primary osteoarthritis of both hands 02/22/2016   Primary osteoarthritis of both feet 02/22/2016   Fatigue 02/22/2016   Osteochondroma of foot, right 02/22/2016   Anxiety 07/15/2011  Past Medical History:  Diagnosis Date   Anxiety    Depression    Mouth ulcers    most of your life/ due to stress   Osteopenia    Past Surgical History:  Procedure Laterality Date   FOOT SURGERY  1985   right foot   HERNIA REPAIR     s   TONSILLECTOMY     48-60 years old   Allergies  Allergen Reactions   Nitrofurantoin Monohyd Macro     Lip swelling   Prior to Admission medications   Medication Sig Start Date End Date Taking? Authorizing Provider  Biotin 1 MG CAPS Take by mouth.   Yes [provider]  Calcium-Vitamin D (CALTRATE 600 PLUS-VIT D PO) Take by mouth.   Yes [provider]  EPINEPHrine (AUVI-Q) 0.3 mg/0.3 mL IJ SOAJ injection Inject 0.3 mg into the muscle as needed for anaphylaxis. 02/17/21  Yes Garnet Sierras, DO  EPINEPHrine (EPIPEN 2-PAK) 0.3 mg/0.3 mL IJ SOAJ injection Inject 0.3 mg into the muscle as needed for anaphylaxis. 11/10/20  Yes Wendie Agreste, MD  estradiol (ESTRACE) 0.1  MG/GM vaginal cream Insert vaginally twice weekly 03/10/20  Yes Marny Lowenstein A, NP  FLUoxetine (PROZAC) 20 MG capsule Take 1 capsule (20 mg total) by mouth daily. 11/26/20  Yes Wendie Agreste, MD  hydrOXYzine (ATARAX/VISTARIL) 25 MG tablet Take 0.5-1 tablets (12.5-25 mg total) by mouth at bedtime as needed for anxiety. 05/22/20  Yes Wendie Agreste, MD  Multiple Vitamin (MULTIVITAMIN) tablet Take 1 tablet by mouth daily.   Yes [provider]   Social History   Socioeconomic History   Marital status: Married    Spouse name: Not on file   Number of children: Not on file   Years of education: Not on file   Highest education level: Not on file  Occupational History   Not on file  Tobacco Use   Smoking status: Never   Smokeless tobacco: Never  Vaping Use   Vaping Use: Never used  Substance and Sexual Activity   Alcohol use: Yes    Alcohol/week: 4.0 standard drinks    Types: 4 Glasses of wine per week   Drug use: No   Sexual activity: Yes    Birth control/protection: None    Comment: INTERCOUSRE AGE 58, SEXUAL PARTNERS LESS THAN 5  Other Topics Concern   Not on file  Social History Narrative   Not on file   Social Determinants of Health   Financial Resource Strain: Not on file  Food Insecurity: Not on file  Transportation Needs: Not on file  Physical Activity: Not on file  Stress: Not on file  Social Connections: Not on file  Intimate Partner Violence: Not on file    Review of Systems   Objective:   Vitals:   06/03/21 1051  BP: 122/70  Pulse: 73  Resp: 16  Temp: 98 F (36.7 C)  TempSrc: Temporal  SpO2: 99%  Weight: 123 lb 6.4 oz (56 kg)  Height: 5\' 7"  (1.702 m)     Physical Exam Vitals reviewed.  Constitutional:      Appearance: Normal appearance. She is well-developed.  HENT:     Head: Normocephalic and atraumatic.  Eyes:     Conjunctiva/sclera: Conjunctivae normal.     Pupils: Pupils are equal, round, and reactive to light.  Neck:      Vascular: No carotid bruit.     Comments: No apparent thyromegaly or nodules Cardiovascular:  Rate and Rhythm: Normal rate and regular rhythm.     Heart sounds: Normal heart sounds.  Pulmonary:     Effort: Pulmonary effort is normal.     Breath sounds: Normal breath sounds.  Abdominal:     Palpations: Abdomen is soft. There is no pulsatile mass.     Tenderness: There is no abdominal tenderness.  Musculoskeletal:     Cervical back: Neck supple.     Right lower leg: No edema.     Left lower leg: No edema.  Skin:    General: Skin is warm and dry.     Comments: Few faint vertical ridges on fingernails without discoloration.   Neurological:     Mental Status: She is alert and oriented to person, place, and time.  Psychiatric:        Mood and Affect: Mood normal.        Behavior: Behavior normal.       Assessment & Plan:  KAYLIN MARCON is a 61 y.o. female . Hair changes - Plan: TSH  Generalized anxiety disorder - Plan: hydrOXYzine (ATARAX) 25 MG tablet, FLUoxetine (PROZAC) 20 MG capsule  Insomnia, unspecified type - Plan: hydrOXYzine (ATARAX) 25 MG tablet, FLUoxetine (PROZAC) 20 MG capsule  Brittle nails - Plan: TSH  Low iron - Plan: Ferritin, CBC with Differential/Platelet  Screening for thyroid disorder - Plan: TSH  Anxiety/depression overall controlled.  Continue same dose fluoxetine.  Option to lower dose in the future as some decreased libido or add Wellbutrin.  Decided against changes for now.  Continue counseling.  New concern of low iron noted at attempted blood donation that apparently improved with multivitamin/diet change.  Also new symptoms of brittle hair/hair loss, brittle nails.  Check TSH.  RTC precautions if these labs are normal and symptoms persist or possible dermatology eval may be needed.  Meds ordered this encounter  Medications   hydrOXYzine (ATARAX) 25 MG tablet    Sig: Take 0.5-1 tablets (12.5-25 mg total) by mouth at bedtime as needed  for anxiety.    Dispense:  90 tablet    Refill:  1   FLUoxetine (PROZAC) 20 MG capsule    Sig: Take 1 capsule (20 mg total) by mouth daily.    Dispense:  90 capsule    Refill:  2   Patient Instructions  No change in medications today.  I will check iron test, blood count, thyroid test but if those are abnormal can follow-up to discuss other testing or treatments.  If you continue to notice brittle nails or hair issues and these labs are normal, please follow-up and we can discuss other potential causes.  Thank you for coming in today!    Signed,   Merri Ray, MD [place] 06/03/21 11:35 AM

## 2021-07-19 ENCOUNTER — Ambulatory Visit: Payer: BC Managed Care – PPO | Admitting: Family Medicine

## 2021-07-22 ENCOUNTER — Ambulatory Visit (INDEPENDENT_AMBULATORY_CARE_PROVIDER_SITE_OTHER): Payer: BC Managed Care – PPO | Admitting: Family Medicine

## 2021-07-22 ENCOUNTER — Encounter: Payer: Self-pay | Admitting: Family Medicine

## 2021-07-22 VITALS — BP 130/78 | HR 72 | Temp 98.1°F | Resp 15 | Ht 67.0 in | Wt 124.7 lb

## 2021-07-22 DIAGNOSIS — D509 Iron deficiency anemia, unspecified: Secondary | ICD-10-CM | POA: Diagnosis not present

## 2021-07-22 LAB — CBC WITH DIFFERENTIAL/PLATELET
Basophils Absolute: 0.1 10*3/uL (ref 0.0–0.1)
Basophils Relative: 0.9 % (ref 0.0–3.0)
Eosinophils Absolute: 0.3 10*3/uL (ref 0.0–0.7)
Eosinophils Relative: 3.7 % (ref 0.0–5.0)
HCT: 39.7 % (ref 36.0–46.0)
Hemoglobin: 13 g/dL (ref 12.0–15.0)
Lymphocytes Relative: 20.4 % (ref 12.0–46.0)
Lymphs Abs: 1.9 10*3/uL (ref 0.7–4.0)
MCHC: 32.7 g/dL (ref 30.0–36.0)
MCV: 85.2 fl (ref 78.0–100.0)
Monocytes Absolute: 0.8 10*3/uL (ref 0.1–1.0)
Monocytes Relative: 8.5 % (ref 3.0–12.0)
Neutro Abs: 6.2 10*3/uL (ref 1.4–7.7)
Neutrophils Relative %: 66.5 % (ref 43.0–77.0)
Platelets: 338 10*3/uL (ref 150.0–400.0)
RBC: 4.66 Mil/uL (ref 3.87–5.11)
RDW: 18.5 % — ABNORMAL HIGH (ref 11.5–15.5)
WBC: 9.3 10*3/uL (ref 4.0–10.5)

## 2021-07-22 LAB — FERRITIN: Ferritin: 20.4 ng/mL (ref 10.0–291.0)

## 2021-07-22 NOTE — Progress Notes (Signed)
? ?Subjective:  ?Patient ID: Lindsey Sanford, female    DOB: December 06, 1960  Age: 61 y.o. MRN: [id number] ? ?CC:  ?Chief Complaint  ?Patient presents with  ? Anemia  ?  Pt notes has not noticed any blood in urine or dark stools, pt did start a multi vitamin and iron supplement interchanging days so it is gentler on the stomach, has also been trying to eat iron rich foods, has donated blood as frequently as possible unsure if this could be related   ? Health Maintenance  ?  Pt declined shingrix for now will return for nurse visit when has a day off for rest, has not had a pap within last year due to her provider retiring but is aware she needs to schedule   ? ? ?HPI ?Lindsey Sanford presents for  ? ?Anemia: ?Hemoglobin 11.3 on February 23.  Previous level of 13.2 in 2021.  Ferritin low at 5.7.  Over-the-counter iron supplement recommended.  Alternating multivitamin and iron - slow FE as gentler on her stomach.  Iron 2-3 times per week. Also trying to eat iron rich foods.  Has donated blood in past - last time in December. Low iron in past, then improved.  ?No new fatigue.  ?Some ridges in nails, improving. Taking B supplements.  ?No dark stools or blood in stools.  ?Menopause at 61yo. No vaginal bleeding.  ?Colonoscopy 2019 - repeat 5 years d/t polyps.  ? ?History ?Patient Active Problem List  ? Diagnosis Date Noted  ? History of neoplasm 04/29/2021  ? Melanocytic nevi of trunk 04/29/2021  ? Psoriasis 04/29/2021  ? Rosacea 04/29/2021  ? Seborrheic keratosis 04/29/2021  ? 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 Primary osteoarthritis of both hands 02/22/2016  ? Primary osteoarthritis of both feet 02/22/2016  ? Fatigue 02/22/2016  ? Osteochondroma of foot, right 02/22/2016  ? Anxiety 07/15/2011  ? ?Past Medical History:  ?Diagnosis Date  ? Anxiety   ? Depression   ? Mouth ulcers   ? most of your life/ due to stress  ? Osteopenia   ? ?Past Surgical History:  ?Procedure Laterality Date  ? FOOT SURGERY  1985  ?  right foot  ? HERNIA REPAIR    ? s  ? TONSILLECTOMY    ? 37-66 years old  ? ?Allergies  ?Allergen Reactions  ? Nitrofurantoin Monohyd Macro   ?  Lip swelling  ? ?Prior to Admission medications   ?Medication Sig Start Date End Date Taking? Authorizing Provider  ?Biotin 1 MG CAPS Take by mouth.   Yes [provider]  ?Calcium-Vitamin D (CALTRATE 600 PLUS-VIT D PO) Take by mouth.   Yes [provider]  ?EPINEPHrine (AUVI-Q) 0.3 mg/0.3 mL IJ SOAJ injection Inject 0.3 mg into the muscle as needed for anaphylaxis. 02/17/21  Yes Garnet Sierras, DO  ?EPINEPHrine (EPIPEN 2-PAK) 0.3 mg/0.3 mL IJ SOAJ injection Inject 0.3 mg into the muscle as needed for anaphylaxis. 11/10/20  Yes Wendie Agreste, MD  ?estradiol (ESTRACE) 0.1 MG/GM vaginal cream Insert vaginally twice weekly 03/10/20  Yes Marny Lowenstein A, NP  ?FLUoxetine (PROZAC) 20 MG capsule Take 1 capsule (20 mg total) by mouth daily. 06/03/21  Yes Wendie Agreste, MD  ?hydrOXYzine (ATARAX) 25 MG tablet Take 0.5-1 tablets (12.5-25 mg total) by mouth at bedtime as needed for anxiety. 06/03/21  Yes Wendie Agreste, MD  ?Iron-Vitamin C (IRON 100/C PO) Take by mouth.   Yes [provider]  ?Multiple Vitamin (  MULTIVITAMIN) tablet Take 1 tablet by mouth daily.   Yes [provider]  ? ?Social History  ? ?Socioeconomic History  ? Marital status: Married  ?  Spouse name: Not on file  ? Number of children: Not on file  ? Years of education: Not on file  ? Highest education level: Not on file  ?Occupational History  ? Not on file  ?Tobacco Use  ? Smoking status: Never  ? Smokeless tobacco: Never  ?Vaping Use  ? Vaping Use: Never used  ?Substance and Sexual Activity  ? Alcohol use: Yes  ?  Alcohol/week: 4.0 standard drinks  ?  Types: 4 Glasses of wine per week  ? Drug use: No  ? Sexual activity: Yes  ?  Birth control/protection: None  ?  Comment: INTERCOUSRE AGE 60, SEXUAL PARTNERS LESS THAN 5  ?Other Topics Concern  ? Not on file  ?Social History  Narrative  ? Not on file  ? ?Social Determinants of Health  ? ?Financial Resource Strain: Not on file  ?Food Insecurity: Not on file  ?Transportation Needs: Not on file  ?Physical Activity: Not on file  ?Stress: Not on file  ?Social Connections: Not on file  ?Intimate Partner Violence: Not on file  ? ? ?Review of Systems ?Per HPI.  ? ?Objective:  ? ?Vitals:  ? 07/22/21 0957  ?BP: 130/78  ?Pulse: 72  ?Resp: 15  ?Temp: 98.1 ?F (36.7 ?C)  ?TempSrc: Temporal  ?SpO2: 98%  ?Weight: 124 lb 11.2 oz (56.6 kg)  ?Height: '5\' 7"'$  (1.702 m)  ? ? ? ?Physical Exam ?Vitals reviewed.  ?Constitutional:   ?   Appearance: Normal appearance. She is well-developed.  ?HENT:  ?   Head: Normocephalic and atraumatic.  ?Eyes:  ?   Conjunctiva/sclera: Conjunctivae normal.  ?   Pupils: Pupils are equal, round, and reactive to light.  ?Neck:  ?   Vascular: No carotid bruit.  ?Cardiovascular:  ?   Rate and Rhythm: Normal rate and regular rhythm.  ?   Heart sounds: Normal heart sounds.  ?Pulmonary:  ?   Effort: Pulmonary effort is normal.  ?   Breath sounds: Normal breath sounds.  ?Abdominal:  ?   Palpations: Abdomen is soft. There is no pulsatile mass.  ?   Tenderness: There is no abdominal tenderness.  ?Musculoskeletal:  ?   Right lower leg: No edema.  ?   Left lower leg: No edema.  ?Skin: ?   General: Skin is warm and dry.  ?Neurological:  ?   Mental Status: She is alert and oriented to person, place, and time.  ?Psychiatric:     ?   Mood and Affect: Mood normal.     ?   Behavior: Behavior normal.  ? ? ? ? ? ?Assessment & Plan:  ?Lindsey Sanford is a 61 y.o. female . ?Iron deficiency anemia, unspecified iron deficiency anemia type - Plan: CBC with Differential/Platelet, Ferritin ? -Repeat ferritin, continue supplementation.  Up-to-date on colonoscopy.  Discussed avoiding blood donation for now until levels stabilize.  54-monthfollow-up for routine labs.  RTC precautions if new symptoms sooner. ? ?No orders of the defined types were placed in  this encounter. ? ?Patient Instructions  ?I will repeat labs today.  Continue iron supplement, multivitamin.  Depending on labs can discuss repeat blood donation but would wait until level stabilized. ? ? ? ?Signed,  ? ?JMerri Ray MD ?[facility] ?07/22/21 ?9:58 AM ? ? ?

## 2021-07-22 NOTE — Patient Instructions (Signed)
I will repeat labs today.  Continue iron supplement, multivitamin.  Depending on labs can discuss repeat blood donation but would wait until level stabilized. ?

## 2021-07-27 ENCOUNTER — Encounter: Payer: Self-pay | Admitting: Family Medicine

## 2021-07-28 NOTE — Telephone Encounter (Signed)
Pt has follow up questions in regard to blood work. Please advise on if pt should continue iron  ?

## 2021-08-05 ENCOUNTER — Ambulatory Visit (INDEPENDENT_AMBULATORY_CARE_PROVIDER_SITE_OTHER): Payer: BC Managed Care – PPO | Admitting: Nurse Practitioner

## 2021-08-05 ENCOUNTER — Encounter: Payer: Self-pay | Admitting: Nurse Practitioner

## 2021-08-05 VITALS — BP 118/76 | Ht 67.0 in | Wt 124.0 lb

## 2021-08-05 DIAGNOSIS — E785 Hyperlipidemia, unspecified: Secondary | ICD-10-CM

## 2021-08-05 DIAGNOSIS — M810 Age-related osteoporosis without current pathological fracture: Secondary | ICD-10-CM

## 2021-08-05 DIAGNOSIS — N951 Menopausal and female climacteric states: Secondary | ICD-10-CM

## 2021-08-05 DIAGNOSIS — Z01419 Encounter for gynecological examination (general) (routine) without abnormal findings: Secondary | ICD-10-CM | POA: Diagnosis not present

## 2021-08-05 DIAGNOSIS — Z780 Asymptomatic menopausal state: Secondary | ICD-10-CM

## 2021-08-05 MED ORDER — ESTRADIOL 0.1 MG/GM VA CREA: TOPICAL_CREAM | VAGINAL | 2 refills | Status: DC

## 2021-08-05 NOTE — Progress Notes (Signed)
? ?Lindsey Sanford Aug 22, 1960 [id number] ? ? ?History:  61 y.o. G3P3 presents for annual exam. Postmenopausal - no bleeding, using vaginal estrogen for dryness/dyspareunia. Normal pap and mammogram history. Osteoporosis of bilateral hips-declines treatment.  ? ?Gynecologic History ?Patient's last menstrual period was 09/11/2013. ?  ?Contraception: post menopausal status ?Sexually active: Yes ? ?Health maintenance ?Last Pap: 01/24/2018. Results were: Normal, 5-year repeat ?Last mammogram: 09/08/2020. Results were: Normal ?Last colonoscopy: 12/26/2017. Results were: Polyps, 5-year recall ?Last Dexa: 02/27/2019. Results were: T-score -2.7 ? ?Past medical history, past surgical history, family history and social history were all reviewed and documented in the EPIC chart. Married. Husband works for Ship broker housing at [employer]. Patient retired. 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 Also care for adult patient with disabilities.  ? ?ROS:  A ROS was performed and pertinent positives and negatives are included. ? ?Exam: ? ?Vitals:  ? 08/05/21 1025  ?BP: 118/76  ?Weight: 124 lb (56.2 kg)  ?Height: '5\' 7"'$  (1.702 m)  ? ? ?Body mass index is 19.42 kg/m?. ? ?General appearance:  Normal ?Thyroid:  Symmetrical, normal in size, without palpable masses or nodularity. ?Respiratory ? Auscultation:  Clear without wheezing or rhonchi ?Cardiovascular ? Auscultation:  Regular rate, without rubs, murmurs or gallops ? Edema/varicosities:  Not grossly evident ?Abdominal ? Soft,nontender, without masses, guarding or rebound. ? Liver/spleen:  No organomegaly noted ? Hernia:  None appreciated ? Skin ? Inspection:  Grossly normal ?  ?Breasts: Examined lying and sitting.  ? Right: Without masses, retractions, discharge or axillary adenopathy. ? ? Left: Without masses, retractions, discharge or axillary adenopathy. ?Genitourinary   ? Inguinal/mons:  Normal without inguinal adenopathy ? External genitalia:  Normal appearing vulva with no masses, tenderness, or lesions ? BUS/Urethra/Skene's glands:  Normal ? Vagina:  Normal appearing with normal color and discharge, no lesions. Atrophic changes ? Cervix:  Normal appearing without discharge or lesions ? Uterus:  Normal in size, shape and contour.  Midline and mobile, nontender ? Adnexa/parametria:   ?  Rt: Normal in size, without masses or tenderness. ?  Lt: Normal in size, without masses or tenderness. ? Anus and perineum: Normal ? Digital rectal exam: Normal sphincter tone without palpated masses or tenderness ? ?Patient informed chaperone available to be present for breast and pelvic exam. Patient has requested no chaperone to be present. Patient has been advised what will be completed during breast and pelvic exam.  ? ?Assessment/Plan:  61 y.o. G3P3 for annual exam.  ? ?Well female exam with routine gynecological exam - Plan: Comprehensive metabolic panel. Education provided on SBEs, importance of preventative screenings, current guidelines, high calcium diet, regular exercise, and multivitamin daily.  CBC done recently with PCP.  ? ?Postmenopausal - no HRT, no bleeding ? ?Menopausal vaginal dryness -  ? ?Hyperlipidemia, unspecified hyperlipidemia type - Plan: Lipid panel ? ?Menopausal vaginal dryness - Plan: estradiol (ESTRACE) 0.1 MG/GM vaginal cream twice weekly. Is not always consistent and can tell when she isn't. Recommend using consistently. Refill provided. ? ?Age-related osteoporosis without current pathological fracture - Plan: DG Bone Density. Declines treatment and prefers to optimize Vitamin D and Calcium. Recommend increasing exercise and incorporating weight training. ? ?Screening for cervical cancer -normal Pap history.  Will repeat at 5-year interval per guidelines. ? ?Screening for breast cancer - Normal mammogram history.  Continue annual screenings.  Normal breast exam  today. ? ?Screening for colon cancer -  2019 colonoscopy. Will repeat at 5-year interval per GI's recommendation.  ? ?Follow-up in 1 year for annual. ? ? ? ? ? ? ?Tamela Gammon [facility], 10:58 AM 08/05/2021 ? ?

## 2021-08-06 LAB — COMPREHENSIVE METABOLIC PANEL
AG Ratio: 1.5 (calc) (ref 1.0–2.5)
ALT: 16 U/L (ref 6–29)
AST: 21 U/L (ref 10–35)
Albumin: 4.3 g/dL (ref 3.6–5.1)
Alkaline phosphatase (APISO): 79 U/L (ref 37–153)
BUN: 15 mg/dL (ref 7–25)
CO2: 25 mmol/L (ref 20–32)
Calcium: 9.4 mg/dL (ref 8.6–10.4)
Chloride: 99 mmol/L (ref 98–110)
Creat: 0.87 mg/dL (ref 0.50–1.05)
Globulin: 2.9 g/dL (calc) (ref 1.9–3.7)
Glucose, Bld: 81 mg/dL (ref 65–99)
Potassium: 4.2 mmol/L (ref 3.5–5.3)
Sodium: 134 mmol/L — ABNORMAL LOW (ref 135–146)
Total Bilirubin: 0.4 mg/dL (ref 0.2–1.2)
Total Protein: 7.2 g/dL (ref 6.1–8.1)

## 2021-08-06 LAB — LIPID PANEL
Cholesterol: 239 mg/dL — ABNORMAL HIGH (ref <200)
HDL: 105 mg/dL (ref >=50)
LDL Cholesterol (Calc): 114 mg/dL (calc) — ABNORMAL HIGH
Non-HDL Cholesterol (Calc): 134 mg/dL (calc) — ABNORMAL HIGH (ref <130)
Total CHOL/HDL Ratio: 2.3 (calc) (ref <5.0)
Triglycerides: 94 mg/dL (ref <150)

## 2021-08-11 ENCOUNTER — Other Ambulatory Visit: Payer: Self-pay | Admitting: Nurse Practitioner

## 2021-08-11 ENCOUNTER — Ambulatory Visit (INDEPENDENT_AMBULATORY_CARE_PROVIDER_SITE_OTHER): Payer: BC Managed Care – PPO

## 2021-08-11 DIAGNOSIS — Z13820 Encounter for screening for osteoporosis: Secondary | ICD-10-CM

## 2021-08-11 DIAGNOSIS — M810 Age-related osteoporosis without current pathological fracture: Secondary | ICD-10-CM

## 2021-08-11 DIAGNOSIS — Z780 Asymptomatic menopausal state: Secondary | ICD-10-CM | POA: Diagnosis not present

## 2021-08-17 ENCOUNTER — Encounter: Payer: Self-pay | Admitting: Family Medicine

## 2021-08-19 ENCOUNTER — Ambulatory Visit: Payer: BC Managed Care – PPO

## 2021-08-19 DIAGNOSIS — M19072 Primary osteoarthritis, left ankle and foot: Secondary | ICD-10-CM

## 2021-08-19 NOTE — Progress Notes (Signed)
SITUATION: ?Reason for Visit: Fitting and Delivery of Custom Fabricated Foot Orthoses ?Patient Report: Patient reports comfort and is satisfied with device. ? ?OBJECTIVE DATA: ?Patient History / Diagnosis:   ?  ICD-10-CM   ?1. Primary osteoarthritis of both feet  M19.071   ? M42.683   ?  ? ? ?Provided Device:  Custom Functional Foot Orthotics ?    [facility]: [id number] ? ?GOAL OF ORTHOSIS ?- Improve gait ?- Decrease energy expenditure ?- Improve Balance ?- Provide Triplanar stability of foot complex ?- Facilitate motion ? ?ACTIONS PERFORMED ?Patient was fit with foot orthotics trimmed to shoe last. Patient tolerated fittign procedure.  ? ?Patient was provided with verbal and written instruction and demonstration regarding donning, doffing, wear, care, proper fit, function, purpose, cleaning, and use of the orthosis and in all related precautions and risks and benefits regarding the orthosis. ? ?Patient was also provided with verbal instruction regarding how to report any failures or malfunctions of the orthosis and necessary follow up care. Patient was also instructed to contact our office regarding any change in status that may affect the function of the orthosis. ? ?Patient demonstrated independence with proper donning, doffing, and fit and verbalized understanding of all instructions. ? ?PLAN: ?Patient is to follow up in one week or as necessary (PRN). All questions were answered and concerns addressed. Plan of care was discussed with and agreed upon by the patient. ? ?

## 2021-09-07 ENCOUNTER — Ambulatory Visit: Payer: Self-pay

## 2021-09-07 ENCOUNTER — Ambulatory Visit (INDEPENDENT_AMBULATORY_CARE_PROVIDER_SITE_OTHER): Payer: BC Managed Care – PPO

## 2021-09-07 ENCOUNTER — Ambulatory Visit (INDEPENDENT_AMBULATORY_CARE_PROVIDER_SITE_OTHER): Payer: BC Managed Care – PPO | Admitting: Family Medicine

## 2021-09-07 VITALS — BP 162/90 | HR 78 | Ht 67.0 in | Wt 127.2 lb

## 2021-09-07 DIAGNOSIS — M542 Cervicalgia: Secondary | ICD-10-CM

## 2021-09-07 DIAGNOSIS — M25511 Pain in right shoulder: Secondary | ICD-10-CM | POA: Diagnosis not present

## 2021-09-07 NOTE — Progress Notes (Signed)
Subjective:    CC: R shoulder and back pain  I, Lindsey Sanford, LAT, ATC, am serving as scribe for Dr. Lynne Leader.  HPI: Pt is a 61 y/o female c/o R shoulder and back pain after suffering a fall on 08/30/21. Pt caught her R foot on a piece of furniture and fell on her R side w/ R shoulder fully extended.  She locates her pain to R-side of her neck, around the R scapula, and R GH joint.  Radiating pain: no Aggravating factors: overhead, increased activity Treatments tried: massage, IBU  Pertinent review of Systems: No fevers or chills  Relevant historical information: Osteoarthritis   Objective:    Vitals:   09/07/21 1107  BP: (!) 162/90  Pulse: 78  SpO2: 98%   General: Well Developed, well nourished, and in no acute distress.   MSK: C-spine: Normal. Nontender midline.  Tender palpation right cervical paraspinal musculature and right trapezius. Normal cervical motion. Upper extremity strength is intact. Reflexes are intact bilaterally.  Right shoulder: Normal-appearing Tender palpation right trapezius.  Nontender along the ridges of the scapula. Normal shoulder motion. Intact strength. Pain with abduction. Positive Hawkins and Neer's test. Positive empty can test.   Lab and Radiology Results  X-ray images right shoulder and C-spine obtained today personally and independently interpreted  C-spine: No acute fractures.  Loss of cervical lordosis.  No acute fractures.  Right shoulder: No acute fractures are visible.  No significant degenerative changes present.  Await formal radiology review   Impression and Recommendations:    Assessment and Plan: 62 y.o. female with neck and shoulder pain right side after a fall.  Thought to be muscle spasm and dysfunction and possible rotator cuff irritation or strain.  Plan for physical therapy.  Also recommend heating pad.  We talked about muscle relaxers and she declined muscle relaxers for now.  Recheck in 6 weeks.   Return sooner if needed.Marland Kitchen  PDMP not reviewed this encounter. Orders Placed This Encounter  Procedures   DG Cervical Spine Complete    Order Specific Question:   Reason for Exam (SYMPTOM  OR DIAGNOSIS REQUIRED)    Answer:   neck pain    Order Specific Question:   Preferred imaging location?    Answer:   [facility]    Order Specific Question:   Is patient pregnant?    Answer:   No   DG Shoulder Right    Standing Status:   Future    Number of Occurrences:   1    Standing Expiration Date:   09/08/2022    Order Specific Question:   Reason for Exam (SYMPTOM  OR DIAGNOSIS REQUIRED)    Answer:   right shoulder pain    Order Specific Question:   Preferred imaging location?    Answer:   [facility]    Order Specific Question:   Is patient pregnant?    Answer:   No   Ambulatory referral to Physical Therapy    Referral Priority:   Routine    Referral Type:   Physical Medicine    Referral Reason:   Specialty Services Required    Requested Specialty:   Physical Therapy    Number of Visits Requested:   1   No orders of the defined types were placed in this encounter.   Discussed warning signs or symptoms. Please see discharge instructions. Patient expresses understanding.   The above documentation has been reviewed and is accurate and complete Ellard Artis  Georgina Snell, M.D.

## 2021-09-07 NOTE — Patient Instructions (Addendum)
Thank you for coming in today.   I've referred you to Physical Therapy.  Let us know if you don't hear from them in one week.   [facility]  Please get an Xray today before you leave   Check back in 6 weeks

## 2021-09-08 ENCOUNTER — Encounter: Payer: Self-pay | Admitting: Obstetrics & Gynecology

## 2021-09-08 ENCOUNTER — Ambulatory Visit: Payer: BC Managed Care – PPO | Admitting: Obstetrics & Gynecology

## 2021-09-08 VITALS — BP 122/70

## 2021-09-08 DIAGNOSIS — M810 Age-related osteoporosis without current pathological fracture: Secondary | ICD-10-CM | POA: Diagnosis not present

## 2021-09-08 DIAGNOSIS — Z780 Asymptomatic menopausal state: Secondary | ICD-10-CM

## 2021-09-08 NOTE — Progress Notes (Signed)
    Lindsey Sanford [birth year] [id number]        60 y.o.  G3P3L3  RP: Management and counseling on Osteoporosis   HPI: Diagnosed with Osteoporosis on the BD in 02/2019 with a T-Score of -2.7 at the Lt Femoral Neck.  Patient decided to change her life style instead of starting on Bone Medication.  Walking and weights most days.  Good nutrition with dairies.  Plans to add a Ca++ supplement.  Taking a Vit D supplement.  Had a fall recently with no fracture.   OB History  Gravida Para Term Preterm AB Living  '3 3       3  '$ SAB IAB Ectopic Multiple Live Births               # Outcome Date GA Lbr Len/2nd Weight Sex Delivery Anes PTL Lv  3 Para           2 Para           1 Para             Past medical history,surgical history, problem list, medications, allergies, family history and social history were all reviewed and documented in the EPIC chart.   Directed ROS with pertinent positives and negatives documented in the history of present illness/assessment and plan.  Exam:  Vitals:   09/08/21 1403  BP: 122/70   General appearance:  Normal  Bone Density 08/11/2021: Osteoporosis T-Score -2.6 at the Lt Femoral Neck.  Significant improvement of Bone Density at the AP Spine and Rt Total Hip.  Stable at the Lt Total Hip.   Assessment/Plan:  61 y.o. G3P3L3   1. Age-related osteoporosis without current pathological fracture Diagnosed with Osteoporosis on the BD in 02/2019 with a T-Score of -2.7 at the Lt Femoral Neck.  Patient decided to change her life style instead of starting on Bone Medication.  Walking and weights most days.  Good nutrition with dairies.  Plans to add a Ca++ supplement.  Taking a Vit D supplement.  Had a fall recently with no fracture.  Her Bone Density on 08/11/2021 still showed Osteoporosis T-Score -2.6 at the Lt Femoral Neck, but she had significant improvement of Bone Density at the AP Spine and Rt Total Hip and was stable at the Lt Total Hip.  Will continue and  increase weight bearing fitness activities. Planning to add Yoga.  Will increase Ca++ total intake to 1.5 g/d and continue Vit D supplement.  Prefers not to start on Bone Medication and I agree with her decision. Repeat BD in 2 years.  2. Postmenopausal  Well on no HRT.  Counseling on Osteoporosis and management reviewed, Bone Density results explained in details and compared with the BD results on 02/2019 for 25 minutes.  Princess Bruins MD, 2:21 PM 09/08/2021

## 2021-09-09 NOTE — Progress Notes (Signed)
X-ray images cervical spine shows some arthritis changes.

## 2021-09-09 NOTE — Progress Notes (Signed)
X-ray images right shoulder looks normal to radiology

## 2021-09-21 ENCOUNTER — Ambulatory Visit (INDEPENDENT_AMBULATORY_CARE_PROVIDER_SITE_OTHER): Payer: BC Managed Care – PPO | Admitting: Physical Therapy

## 2021-09-21 DIAGNOSIS — M25511 Pain in right shoulder: Secondary | ICD-10-CM

## 2021-09-21 NOTE — Therapy (Addendum)
OUTPATIENT PHYSICAL THERAPY SHOULDER EVALUATION   Patient Name: Lindsey Sanford MRN: [id number] DOB:[birth year], 61 y.o., female Today's Date: 09/28/2021   PT End of Session - 09/28/21 0852     Visit Number 1    Number of Visits 12    Date for PT Re-Evaluation 11/02/21    Authorization Type BCBS    PT Start Time 1015    PT Stop Time 1056    PT Time Calculation (min) 41 min    Activity Tolerance Patient tolerated treatment well    Behavior During Therapy WFL for tasks assessed/performed             Past Medical History:  Diagnosis Date   Anxiety    Depression    Mouth ulcers    most of your life/ due to stress   Osteopenia    Osteoporosis    Past Surgical History:  Procedure Laterality Date   FOOT SURGERY  1985   right foot   HERNIA REPAIR     s   TONSILLECTOMY     65-70 years old   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   Primary osteoarthritis of both hands 02/22/2016   Primary osteoarthritis of both feet 02/22/2016   Fatigue 02/22/2016   Osteochondroma of foot, right 02/22/2016   Anxiety 07/15/2011    PCP: Corliss Parish  REFERRING PROVIDER: Lynne Leader  REFERRING DIAG: R shoulder   THERAPY DIAG:  Acute pain of right shoulder  Rationale for Evaluation and Treatment Rehabilitation  ONSET DATE: 3 weeks ago  SUBJECTIVE:                                                                                                                                                                                      SUBJECTIVE STATEMENT:  3 weeks ago, tripped, fell onto R arm/hand, arm went up overhead. Pt states ongoing pain since incident, getting a bit better, is usually Better in AM.  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    PERTINENT  HISTORY:   PAIN:  Are you having pain? Yes: NPRS scale: 3-4, up to 6-7/10  /10 Pain location: R shoulder/neck  Pain description: Sore Aggravating factors: lifting, reaching, carrying.  Relieving factors: rest   PRECAUTIONS: None  WEIGHT BEARING RESTRICTIONS No  FALLS:  Has patient fallen in last 6 months? Yes. Number of falls 1   PLOF: Independent  PATIENT GOALS  decreased pain   OBJECTIVE:  DIAGNOSTIC FINDINGS:    COGNITION:  Overall cognitive status: Within functional limits for tasks assessed     POSTURE: R lateral   UPPER EXTREMITY ROM:   Shoulder ROM: WFL, mild soreness at mid and end ranges for flex and abd   UPPER EXTREMITY MMT:  MMT Right eval Left eval  Shoulder flexion  4  Shoulder extension    Shoulder abduction  4-  Shoulder adduction    Shoulder internal rotation  4  Shoulder external rotation  4  Middle trapezius    Lower trapezius    Elbow flexion    Elbow extension    Wrist flexion    Wrist extension    Wrist ulnar deviation    Wrist radial deviation    Wrist pronation    Wrist supination    Grip strength (lbs)    (Blank rows = not tested)   JOINT MOBILITY TESTING:  WFL  PALPATION:     TODAY'S TREATMENT:  See below for HEP   PATIENT EDUCATION: Education details: PT POC, Exam findings, HEP Person educated: Patient Education method: Explanation, Demonstration, Tactile cues, Verbal cues, and Handouts Education comprehension: verbalized understanding, returned demonstration, verbal cues required, tactile cues required, and needs further education   HOME EXERCISE PROGRAM: Access Code: [email or web address] URL: [email or web address] Date: 09/28/2021 Prepared by: Lyndee Hensen  Exercises - Supine Shoulder Flexion Extension AAROM with Dowel  - 1 x daily - 1-2 sets - 10 reps - Sidelying Shoulder External Rotation  - 1 x daily - 1- 2 sets - 10 reps - Standing 'L' Stretch at Counter  - 2 x daily - 3 reps - 15- 20  hold - Standing Shoulder Posterior Capsule Stretch  - 2 x daily - 3 reps - 20 hold - Seated Cervical Sidebending Stretch  - 2 x daily - 3 reps - 30 hold   ASSESSMENT:  CLINICAL IMPRESSION: Patient presents with primary complaint of increased pain in R shoulder following fall. She has increased pain with ROM and decreased strength. She has decreased ability for functional activity, with decreased ability for ADLS, IADLS, and work duties. Pt to benefit from skilled PT to improve deficits and pain.    OBJECTIVE IMPAIRMENTS decreased activity tolerance, decreased knowledge of use of DME, decreased mobility, decreased ROM, decreased strength, increased muscle spasms, impaired UE functional use, and pain.   ACTIVITY LIMITATIONS carrying, lifting, dressing, reach over head, and locomotion level  PARTICIPATION LIMITATIONS: meal prep, cleaning, laundry, driving, community activity, occupation, and yard work  PERSONAL FACTORS  none  are also affecting patient's functional outcome.   REHAB POTENTIAL: Good  CLINICAL DECISION MAKING: Stable/uncomplicated  EVALUATION COMPLEXITY: Low   GOALS: Goals reviewed with patient? Yes  SHORT TERM GOALS: Target date:   10/05/21   Pt to be independent with initial HEP  Goal status: INITIAL  2.  Pt to report decreased pain to 0-4/10 with flex/abd ROM.  Goal status: INITIAL    LONG TERM GOALS: Target date: 11/02/21   Pt to be independent with final HEP   Goal status: INITIAL  2.  Pt to report decreased pain in R shoulder to 0-2/10 with overhead, reaching, lifting, and IADLs motions.   Goal status: INITIAL  3.  Pt to demo improved strength of R shoulder to at least 4+/5 to improve ability for IADLs and work duties.   Goal status: INITIAL     PLAN: PT FREQUENCY: 2x/week  PT DURATION: 6 weeks  PLANNED INTERVENTIONS: Therapeutic exercises, Therapeutic activity, Neuromuscular re-education, Patient/Family  education, Joint manipulation,  Joint mobilization, DME instructions, Dry Needling, Spinal manipulation, Spinal mobilization, Cryotherapy, Moist heat, Splintting, Taping, Vasopneumatic device, Traction, Ultrasound, Ionotophoresis '4mg'$ /ml Dexamethasone, and Manual therapy  PLAN FOR NEXT SESSION:   Lyndee Hensen, PT, DPT 8:57 AM  09/28/21

## 2021-09-28 ENCOUNTER — Encounter: Payer: Self-pay | Admitting: Physical Therapy

## 2021-09-28 ENCOUNTER — Ambulatory Visit (INDEPENDENT_AMBULATORY_CARE_PROVIDER_SITE_OTHER): Payer: BC Managed Care – PPO | Admitting: Physical Therapy

## 2021-09-28 DIAGNOSIS — M25511 Pain in right shoulder: Secondary | ICD-10-CM

## 2021-09-28 NOTE — Addendum Note (Signed)
Addended by: Lyndee Hensen on: 09/28/2021 02:21 PM   Modules accepted: Orders

## 2021-09-28 NOTE — Therapy (Signed)
OUTPATIENT PHYSICAL THERAPY TREATMENT    Patient Name: Lindsey Sanford MRN: [id number] DOB:Aug 31, 1960, 61 y.o., female Today's Date: 09/28/2021   PT End of Session - 09/28/21 1324     Visit Number 2    Number of Visits 12    Date for PT Re-Evaluation 11/02/21    Authorization Type BCBS    PT Start Time 1217    PT Stop Time 1259    PT Time Calculation (min) 42 min    Activity Tolerance Patient tolerated treatment well    Behavior During Therapy WFL for tasks assessed/performed              Past Medical History:  Diagnosis Date   Anxiety    Depression    Mouth ulcers    most of your life/ due to stress   Osteopenia    Osteoporosis    Past Surgical History:  Procedure Laterality Date   FOOT SURGERY  1985   right foot   HERNIA REPAIR     s   TONSILLECTOMY     77-19 years old   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   Primary osteoarthritis of both hands 02/22/2016   Primary osteoarthritis of both feet 02/22/2016   Fatigue 02/22/2016   Osteochondroma of foot, right 02/22/2016   Anxiety 07/15/2011    PCP: Corliss Parish  REFERRING PROVIDER: Lynne Leader  REFERRING DIAG: R shoulder   THERAPY DIAG:  Acute pain of right shoulder  Rationale for Evaluation and Treatment Rehabilitation  ONSET DATE: 3 weeks ago  SUBJECTIVE:                                                                                                                                                                                      SUBJECTIVE STATEMENT: 09/28/2021  Pt states less pain in shoulder as well as in UT region.   Eval: 3 weeks ago, tripped, fell onto R arm/hand, arm went up overhead. Pt states ongoing pain since incident, getting a bit better, is usually Better in AM.   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    PERTINENT HISTORY:   PAIN:  Are you having pain? Yes: NPRS scale: 3-4, up to 6-7/10  /10 Pain location: R shoulder/neck  Pain description: Sore Aggravating factors: lifting, reaching, carrying.  Relieving factors: rest   PRECAUTIONS: None  WEIGHT BEARING RESTRICTIONS No  FALLS:  Has patient fallen in last 6 months? Yes. Number  of falls 1   PLOF: Independent  PATIENT GOALS  decreased pain   OBJECTIVE:   DIAGNOSTIC FINDINGS:    COGNITION:  Overall cognitive status: Within functional limits for tasks assessed     POSTURE: R lateral   UPPER EXTREMITY ROM:   Shoulder ROM: WFL, mild soreness at mid and end ranges for flex and abd   UPPER EXTREMITY MMT:  MMT Right eval Left eval  Shoulder flexion  4  Shoulder extension    Shoulder abduction  4-  Shoulder adduction    Shoulder internal rotation  4  Shoulder external rotation  4  Middle trapezius    Lower trapezius    Elbow flexion    Elbow extension    Wrist flexion    Wrist extension    Wrist ulnar deviation    Wrist radial deviation    Wrist pronation    Wrist supination    Grip strength (lbs)    (Blank rows = not tested)   JOINT MOBILITY TESTING:  WFL  PALPATION:     TODAY'S TREATMENT:  09/28/2021 Therapeutic Exercise: Aerobic: Supine: Shoulder AAROM/flexion x 15 S/L ER 1 lb x 10, 2lb x 10;  Seated: pulley/flexion x 2 min Standing: Wall slides x 10 (review for HEP) AROM/scaption to 90 deg x 10;  Stretches: Shoulder flexion stretch at rail x 2 min;  Neuromuscular Re-education: Manual Therapy: STM/TPR to R UT , levator, and rhomboid.  PROM to R shoulder, all motions, Shoulder post and inf mobs.  Trigger Point Dry-Needling  Treatment instructions: Expect mild to moderate muscle soreness. S/S of pneumothorax if dry needled over a lung field, and to seek immediate medical attention should they occur. Patient verbalized understanding of these  instructions and education.  Patient Consent Given: Yes Education handout provided: Yes Muscles treated: R UT Electrical stimulation performed: No Parameters: N/A Treatment response/outcome: Twitch response, increased muscle length      PATIENT EDUCATION: Education details: updated and reviewed HEP Person educated: Patient Education method: Explanation, Demonstration, Tactile cues, Verbal cues, and Handouts Education comprehension: verbalized understanding, returned demonstration, verbal cues required, tactile cues required, and needs further education   HOME EXERCISE PROGRAM: Access Code: [email or web address]  ASSESSMENT:  CLINICAL IMPRESSION: Patient did well with ther ex today. Does have pain at end ranges of flex and abd with AROM and PROM. Also with muscle tension and trigger points in UT and med scap border, addressed with manual and Dry needling today, will assess effects next visit. Plan to progress as tolerated.    OBJECTIVE IMPAIRMENTS decreased activity tolerance, decreased knowledge of use of DME, decreased mobility, decreased ROM, decreased strength, increased muscle spasms, impaired UE functional use, and pain.   ACTIVITY LIMITATIONS carrying, lifting, dressing, reach over head, and locomotion level  PARTICIPATION LIMITATIONS: meal prep, cleaning, laundry, driving, community activity, occupation, and yard work  PERSONAL FACTORS  none  are also affecting patient's functional outcome.   REHAB POTENTIAL: Good  CLINICAL DECISION MAKING: Stable/uncomplicated  EVALUATION COMPLEXITY: Low   GOALS: Goals reviewed with patient? Yes  SHORT TERM GOALS: Target date:   10/05/21   Pt to be independent with initial HEP  Goal status: INITIAL  2.  Pt to report decreased pain to 0-4/10 with flex/abd ROM.  Goal status: INITIAL    LONG TERM GOALS: Target date: 11/02/21   Pt to be independent with final HEP   Goal status: INITIAL  2.  Pt to report decreased pain in R  shoulder to 0-2/10 with overhead, reaching,  lifting, and IADLs motions.   Goal status: INITIAL  3.  Pt to demo improved strength of R shoulder to at least 4+/5 to improve ability for IADLs and work duties.   Goal status: INITIAL     PLAN: PT FREQUENCY: 2x/week  PT DURATION: 6 weeks  PLANNED INTERVENTIONS: Therapeutic exercises, Therapeutic activity, Neuromuscular re-education, Patient/Family education, Joint manipulation, Joint mobilization, DME instructions, Dry Needling, Spinal manipulation, Spinal mobilization, Cryotherapy, Moist heat, Splintting, Taping, Vasopneumatic device, Traction, Ultrasound, Ionotophoresis '4mg'$ /ml Dexamethasone, and Manual therapy  PLAN FOR NEXT SESSION:   Lyndee Hensen, PT, DPT 1:24 PM  09/28/21

## 2021-09-30 ENCOUNTER — Ambulatory Visit (INDEPENDENT_AMBULATORY_CARE_PROVIDER_SITE_OTHER): Payer: BC Managed Care – PPO | Admitting: Physical Therapy

## 2021-09-30 ENCOUNTER — Encounter: Payer: Self-pay | Admitting: Physical Therapy

## 2021-09-30 DIAGNOSIS — M25511 Pain in right shoulder: Secondary | ICD-10-CM | POA: Diagnosis not present

## 2021-09-30 NOTE — Therapy (Signed)
OUTPATIENT PHYSICAL THERAPY TREATMENT    Patient Name: Lindsey Sanford MRN: [id number] DOB:06-07-1960, 61 y.o., female Today's Date: 09/30/2021   PT End of Session - 09/30/21 1217     Visit Number 3    Number of Visits 12    Date for PT Re-Evaluation 11/02/21    Authorization Type BCBS    PT Start Time 1217    PT Stop Time 1257    PT Time Calculation (min) 40 min    Activity Tolerance Patient tolerated treatment well    Behavior During Therapy [facility] for tasks assessed/performed              Past Medical History:  Diagnosis Date   Anxiety    Depression    Mouth ulcers    most of your life/ due to stress   Osteopenia    Osteoporosis    Past Surgical History:  Procedure Laterality Date   FOOT SURGERY  1985   right foot   HERNIA REPAIR     s   TONSILLECTOMY     35-63 years old   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   Primary osteoarthritis of both hands 02/22/2016   Primary osteoarthritis of both feet 02/22/2016   Fatigue 02/22/2016   Osteochondroma of foot, right 02/22/2016   Anxiety 07/15/2011    PCP: Corliss Parish  REFERRING PROVIDER: Lynne Leader  REFERRING DIAG: R shoulder   THERAPY DIAG:  Acute pain of right shoulder  Rationale for Evaluation and Treatment Rehabilitation  ONSET DATE: 3 weeks ago  SUBJECTIVE:                                                                                                                                                                                      SUBJECTIVE STATEMENT: 09/30/2021  Pt states less pain in shoulder as well as in UT region.   Eval: 3 weeks ago, tripped, fell onto R arm/hand, arm went up overhead. Pt states ongoing pain since incident, getting a bit better, is usually Better in AM.   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    PERTINENT HISTORY:   PAIN:  Are you having pain? Yes: NPRS scale: 3-4, up to 6-7/10  /10 Pain location: R shoulder/neck  Pain description: Sore Aggravating factors: lifting, reaching, carrying.  Relieving factors: rest   PRECAUTIONS: None  WEIGHT BEARING RESTRICTIONS No  FALLS:  Has patient fallen in last 6 months? Yes. Number  of falls 1   PLOF: Independent  PATIENT GOALS  decreased pain   OBJECTIVE:   DIAGNOSTIC FINDINGS:    COGNITION:  Overall cognitive status: Within functional limits for tasks assessed     POSTURE: R lateral   UPPER EXTREMITY ROM:   Shoulder ROM: WFL, mild soreness at mid and end ranges for flex and abd   UPPER EXTREMITY MMT:  MMT Right eval Left eval  Shoulder flexion  4  Shoulder extension    Shoulder abduction  4-  Shoulder adduction    Shoulder internal rotation  4  Shoulder external rotation  4  Middle trapezius    Lower trapezius    Elbow flexion    Elbow extension    Wrist flexion    Wrist extension    Wrist ulnar deviation    Wrist radial deviation    Wrist pronation    Wrist supination    Grip strength (lbs)    (Blank rows = not tested)   JOINT MOBILITY TESTING:  WFL  PALPATION:     TODAY'S TREATMENT:  09/30/2021 Therapeutic Exercise: Aerobic: Supine: Shoulder AROM/flexion x 15 S/L ER 1 lb x 10, 2lb x 10;  Quadruped SA press x 20;  Seated: pulley/flexion x 2 min Standing: Wall slides x 10; Circles at wall x 10 ea cw/ccw, ball stabs at wall/90 deg of flexion x 20 each.  Stretches:  UT stretch 30 sec x 3 bil;  Neuromuscular Re-education: Manual Therapy: STM/TPR to R UT , levator, and rhomboid.     09/28/20 Therapeutic Exercise: Aerobic: Supine: Shoulder AAROM/flexion x 15 S/L ER 1 lb x 10, 2lb x 10;  Seated: pulley/flexion x 2 min Standing: Wall slides x 10 (review for HEP) AROM/scaption to 90 deg x 10;  Stretches: Shoulder flexion stretch at rail x  2 min;  Neuromuscular Re-education: Manual Therapy: STM/TPR to R UT , levator, and rhomboid.  PROM to R shoulder, all motions, Shoulder post and inf mobs.  Trigger Point Dry-Needling  Treatment instructions: Expect mild to moderate muscle soreness. S/S of pneumothorax if dry needled over a lung field, and to seek immediate medical attention should they occur. Patient verbalized understanding of these instructions and education.  Patient Consent Given: Yes Education handout provided: Yes Muscles treated: R UT Electrical stimulation performed: No Parameters: N/A Treatment response/outcome: Twitch response, increased muscle length      PATIENT EDUCATION: Education details: updated and reviewed HEP Person educated: Patient Education method: Explanation, Demonstration, Tactile cues, Verbal cues, and Handouts Education comprehension: verbalized understanding, returned demonstration, verbal cues required, tactile cues required, and needs further education   HOME EXERCISE PROGRAM: Access Code: [email or web address]  ASSESSMENT:  CLINICAL IMPRESSION: Patient with improving pain. She has less tenderness and tightness in UT muscle today. She has improving pain in shoulder, but still sore with end range flex, abd, and ER.  Will benefit from progressive Ther ex for strengthening, to regain full ROM ability without pain    OBJECTIVE IMPAIRMENTS decreased activity tolerance, decreased knowledge of use of DME, decreased mobility, decreased ROM, decreased strength, increased muscle spasms, impaired UE functional use, and pain.   ACTIVITY LIMITATIONS carrying, lifting, dressing, reach over head, and locomotion level  PARTICIPATION LIMITATIONS: meal prep, cleaning, laundry, driving, community activity, occupation, and yard work  PERSONAL FACTORS  none  are also affecting patient's functional outcome.   REHAB POTENTIAL: Good  CLINICAL DECISION MAKING: Stable/uncomplicated  EVALUATION COMPLEXITY:  Low   GOALS: Goals reviewed with patient? Yes  SHORT TERM GOALS:  Target date:   10/05/21   Pt to be independent with initial HEP  Goal status: INITIAL  2.  Pt to report decreased pain to 0-4/10 with flex/abd ROM.  Goal status: INITIAL    LONG TERM GOALS: Target date: 11/02/21   Pt to be independent with final HEP   Goal status: INITIAL  2.  Pt to report decreased pain in R shoulder to 0-2/10 with overhead, reaching, lifting, and IADLs motions.   Goal status: INITIAL  3.  Pt to demo improved strength of R shoulder to at least 4+/5 to improve ability for IADLs and work duties.   Goal status: INITIAL     PLAN: PT FREQUENCY: 2x/week  PT DURATION: 6 weeks  PLANNED INTERVENTIONS: Therapeutic exercises, Therapeutic activity, Neuromuscular re-education, Patient/Family education, Joint manipulation, Joint mobilization, DME instructions, Dry Needling, Spinal manipulation, Spinal mobilization, Cryotherapy, Moist heat, Splintting, Taping, Vasopneumatic device, Traction, Ultrasound, Ionotophoresis '4mg'$ /ml Dexamethasone, and Manual therapy  PLAN FOR NEXT SESSION:   Lyndee Hensen, PT, DPT 12:58 PM  09/30/21

## 2021-10-05 ENCOUNTER — Encounter: Payer: Self-pay | Admitting: Family Medicine

## 2021-10-05 ENCOUNTER — Encounter: Payer: Self-pay | Admitting: Physical Therapy

## 2021-10-05 ENCOUNTER — Ambulatory Visit (INDEPENDENT_AMBULATORY_CARE_PROVIDER_SITE_OTHER): Payer: BC Managed Care – PPO | Admitting: Physical Therapy

## 2021-10-05 DIAGNOSIS — M25511 Pain in right shoulder: Secondary | ICD-10-CM | POA: Diagnosis not present

## 2021-10-06 ENCOUNTER — Encounter: Payer: Self-pay | Admitting: Family Medicine

## 2021-10-06 NOTE — Telephone Encounter (Signed)
Defer to PCP as I have not seen this patient  Thanks,  Denice Paradise

## 2021-10-07 ENCOUNTER — Ambulatory Visit (INDEPENDENT_AMBULATORY_CARE_PROVIDER_SITE_OTHER): Payer: BC Managed Care – PPO | Admitting: Physical Therapy

## 2021-10-07 ENCOUNTER — Encounter: Payer: Self-pay | Admitting: Physical Therapy

## 2021-10-07 DIAGNOSIS — M25511 Pain in right shoulder: Secondary | ICD-10-CM | POA: Diagnosis not present

## 2021-10-07 NOTE — Therapy (Signed)
OUTPATIENT PHYSICAL THERAPY TREATMENT    Patient Name: Lindsey Sanford MRN: [id number] DOB:1960-12-22, 61 y.o., female Today's Date: 10/07/2021   PT End of Session - 10/07/21 2154     Visit Number 5    Number of Visits 12    Date for PT Re-Evaluation 11/02/21    Authorization Type BCBS    PT Start Time 1304    PT Stop Time 6761    PT Time Calculation (min) 39 min    Activity Tolerance Patient tolerated treatment well    Behavior During Therapy WFL for tasks assessed/performed                Past Medical History:  Diagnosis Date   Anxiety    Depression    Mouth ulcers    most of your life/ due to stress   Osteopenia    Osteoporosis    Past Surgical History:  Procedure Laterality Date   FOOT SURGERY  1985   right foot   HERNIA REPAIR     s   TONSILLECTOMY     4-32 years old   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   Primary osteoarthritis of both hands 02/22/2016   Primary osteoarthritis of both feet 02/22/2016   Fatigue 02/22/2016   Osteochondroma of foot, right 02/22/2016   Anxiety 07/15/2011    PCP: Corliss Parish  REFERRING PROVIDER: Lynne Leader  REFERRING DIAG: R shoulder   THERAPY DIAG:  Acute pain of right shoulder  Rationale for Evaluation and Treatment Rehabilitation  ONSET DATE: 3 weeks ago  SUBJECTIVE:                                                                                                                                                                                      SUBJECTIVE STATEMENT: 10/07/2021  Pt states less pain in shoulder. Felt good after last visit. Does have some muscle tension in R rhomboid region.   Eval: 3 weeks ago, tripped, fell onto R arm/hand, arm went up overhead. Pt states ongoing pain  since incident, getting a bit better, is usually Better in AM.  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    PERTINENT HISTORY:   PAIN:  Are you having pain? Yes: NPRS scale: 3-4, up to 6-7/10  /10 Pain location: R shoulder/neck  Pain description: Sore Aggravating factors: lifting, reaching, carrying.  Relieving factors: rest   PRECAUTIONS: None  WEIGHT BEARING RESTRICTIONS No  FALLS:  Has patient fallen in last 6 months? Yes. Number of falls 1   PLOF: Independent  PATIENT GOALS  decreased pain   OBJECTIVE:   DIAGNOSTIC FINDINGS:    COGNITION:  Overall cognitive status: Within functional limits for tasks assessed     POSTURE: R lateral   UPPER EXTREMITY ROM:   Shoulder ROM: WFL, mild soreness at mid and end ranges for flex and abd   UPPER EXTREMITY MMT:  MMT Right eval Left eval  Shoulder flexion  4  Shoulder extension    Shoulder abduction  4-  Shoulder adduction    Shoulder internal rotation  4  Shoulder external rotation  4  Middle trapezius    Lower trapezius    Elbow flexion    Elbow extension    Wrist flexion    Wrist extension    Wrist ulnar deviation    Wrist radial deviation    Wrist pronation    Wrist supination    Grip strength (lbs)    (Blank rows = not tested)   JOINT MOBILITY TESTING:  WFL  PALPATION:     TODAY'S TREATMENT:  10/07/2021  Therapeutic Exercise: Aerobic: Supine: Shoulder AAROM x 15/cane;  AROM/flexion x 15; Horiz abd x20; SA presses 3lb x 15;  S/L ER  2lb  x 10;  Quadruped SA press x 20; L/R weight shifts x 20;  Seated: pulley/flexion x 2 min Standing:   Low row Rtb x 20;  ER/IR RTB x 15 ea ;  AROM/scaption x 15;  Stretches:  Neuromuscular Re-education: Manual Therapy: STM/TPR to R rhomboid and thoracic paraspinals  09/30/21 Therapeutic Exercise: Aerobic: Supine: Shoulder AROM/flexion x 15 S/L ER 1 lb x 10, 2lb x 10;  Quadruped SA press x 20;  Seated: pulley/flexion x 2 min Standing: Wall slides  x 10; Circles at wall x 10 ea cw/ccw, ball stabs at wall/90 deg of flexion x 20 each.  Stretches:  UT stretch 30 sec x 3 bil;  Neuromuscular Re-education: Manual Therapy: STM/TPR to R UT , levator, and rhomboid.     09/28/20 Therapeutic Exercise: Aerobic: Supine: Shoulder AAROM/flexion x 15 S/L ER 1 lb x 10, 2lb x 10;  Seated: pulley/flexion x 2 min Standing: Wall slides x 10 (review for HEP) AROM/scaption to 90 deg x 10;  Stretches: Shoulder flexion stretch at rail x 2 min;  Neuromuscular Re-education: Manual Therapy: STM/TPR to R UT , levator, and rhomboid.  PROM to R shoulder, all motions, Shoulder post and inf mobs.  Trigger Point Dry-Needling  Treatment instructions: Expect mild to moderate muscle soreness. S/S of pneumothorax if dry needled over a lung field, and to seek immediate medical attention should they occur. Patient verbalized understanding of these instructions and education.  Patient Consent Given: Yes Education handout provided: Yes Muscles treated: R UT Electrical stimulation performed: No Parameters: N/A Treatment response/outcome: Twitch response, increased muscle length      PATIENT EDUCATION: Education details: reviewed HEP Person educated: Patient Education method: Explanation, Demonstration, Tactile cues, Verbal cues, and Handouts Education comprehension: verbalized understanding, returned demonstration, verbal cues required, tactile cues required, and needs further education   HOME EXERCISE PROGRAM: Access Code: [email or web address]  ASSESSMENT:  CLINICAL IMPRESSION: Patient showing good progress overall. She has pain at end range for flex  and abd, but motion is improving. She has improving abilityfor AROM and strengthening. She does have ongoing tightness and tension in R rhomboid, addressed with manual tissue release. Will benefit from continued care.   OBJECTIVE IMPAIRMENTS decreased activity  tolerance, decreased knowledge of use of DME, decreased  mobility, decreased ROM, decreased strength, increased muscle spasms, impaired UE functional use, and pain.   ACTIVITY LIMITATIONS carrying, lifting, dressing, reach over head, and locomotion level  PARTICIPATION LIMITATIONS: meal prep, cleaning, laundry, driving, community activity, occupation, and yard work  PERSONAL FACTORS  none  are also affecting patient's functional outcome.   REHAB POTENTIAL: Good  CLINICAL DECISION MAKING: Stable/uncomplicated  EVALUATION COMPLEXITY: Low   GOALS: Goals reviewed with patient? Yes  SHORT TERM GOALS: Target date:   10/05/21   Pt to be independent with initial HEP  Goal status: INITIAL  2.  Pt to report decreased pain to 0-4/10 with flex/abd ROM.  Goal status: INITIAL    LONG TERM GOALS: Target date: 11/02/21   Pt to be independent with final HEP   Goal status: INITIAL  2.  Pt to report decreased pain in R shoulder to 0-2/10 with overhead, reaching, lifting, and IADLs motions.   Goal status: INITIAL  3.  Pt to demo improved strength of R shoulder to at least 4+/5 to improve ability for IADLs and work duties.   Goal status: INITIAL     PLAN: PT FREQUENCY: 2x/week  PT DURATION: 6 weeks  PLANNED INTERVENTIONS: Therapeutic exercises, Therapeutic activity, Neuromuscular re-education, Patient/Family education, Joint manipulation, Joint mobilization, DME instructions, Dry Needling, Spinal manipulation, Spinal mobilization, Cryotherapy, Moist heat, Splintting, Taping, Vasopneumatic device, Traction, Ultrasound, Ionotophoresis '4mg'$ /ml Dexamethasone, and Manual therapy  PLAN FOR NEXT SESSION:   Lyndee Hensen, PT, DPT 9:56 PM  10/07/21

## 2021-10-14 ENCOUNTER — Other Ambulatory Visit: Payer: Self-pay | Admitting: Family Medicine

## 2021-10-14 DIAGNOSIS — Z1231 Encounter for screening mammogram for malignant neoplasm of breast: Secondary | ICD-10-CM

## 2021-10-14 NOTE — Progress Notes (Signed)
   I, Wendy Poet, LAT, ATC, am serving as scribe for Dr. Lynne Leader.  Lindsey Sanford is a 61 y.o. female who presents to [place] [facility] today for f/u of R shoulder and scapular pain that began after suffering a fall on 08/30/21.  She was last seen by Dr. Georgina Snell on 09/07/21 and was referred to PT at [facility] of which she's completed 5 visits.  Today, pt reports that her R shoulder is feeling much better, rating her improvement at 70%.    Diagnostic testing: R shoulder and C-spine XR- 09/07/21  Pertinent review of systems: No fevers or chills  Relevant historical information: Psoriasis.  Hand arthritis.   Exam:  BP 126/72 (BP Location: Left Arm, Patient Position: Sitting, Cuff Size: Normal)   Pulse 72   Ht '5\' 7"'$  (1.702 m)   Wt 128 lb 12.8 oz (58.4 kg)   LMP 09/11/2013   SpO2 98%   BMI 20.17 kg/m  General: Well Developed, well nourished, and in no acute distress.   MSK: Right shoulder: Normal appearing. Normal motion. Intact strength.    Lab and Radiology Results  EXAM: RIGHT SHOULDER - 2+ VIEW   COMPARISON:  None available   FINDINGS: Nonaggressive appearing sclerotic lesion in the proximal humeral diaphysis likely benign chondroid lesion.   No fracture or dislocation. No significant degenerative changes of the right shoulder. Visualized soft tissues are normal.   IMPRESSION: No significant radiographic abnormality of the right shoulder.     Electronically Signed   By: Miachel Roux M.D.   On: 09/08/2021 09:46     I, Lynne Leader, personally (independently) visualized and performed the interpretation of the images attached in this note.      Assessment and Plan: 61 y.o. female with right shoulder pain significantly improved with physical therapy.  Plan to continue home exercise program and check back as needed.  Of note the benign appearing sclerotic lesion in the proximal humerus was visualized with shoulder MRI in 2016 and at that time  thought to be bone infarct or calcified enchondroma.  Either way still remains benign and not in the location of pain.  Watchful waiting without further evaluation at this time I think is reasonable.  Recheck back as needed.      Discussed warning signs or symptoms. Please see discharge instructions. Patient expresses understanding.   The above documentation has been reviewed and is accurate and complete Lynne Leader, M.D.

## 2021-10-19 ENCOUNTER — Encounter: Payer: Self-pay | Admitting: Family Medicine

## 2021-10-19 ENCOUNTER — Encounter: Payer: Self-pay | Admitting: Physical Therapy

## 2021-10-19 ENCOUNTER — Ambulatory Visit: Payer: BC Managed Care – PPO | Admitting: Family Medicine

## 2021-10-19 ENCOUNTER — Ambulatory Visit (INDEPENDENT_AMBULATORY_CARE_PROVIDER_SITE_OTHER): Payer: BC Managed Care – PPO | Admitting: Physical Therapy

## 2021-10-19 VITALS — BP 126/72 | HR 72 | Ht 67.0 in | Wt 128.8 lb

## 2021-10-19 DIAGNOSIS — M25511 Pain in right shoulder: Secondary | ICD-10-CM | POA: Diagnosis not present

## 2021-10-19 NOTE — Patient Instructions (Signed)
Good to see you today.  Con't w/ PT and finish whenever your PT recommends.  Follow-up as needed.

## 2021-10-19 NOTE — Therapy (Signed)
OUTPATIENT PHYSICAL THERAPY TREATMENT /Discharge   Patient Name: Lindsey Sanford MRN: [id number] DOB:09-Jul-1960, 61 y.o., female Today's Date: 10/19/2021   PT End of Session - 10/19/21 1351     Visit Number 6    Number of Visits 12    Date for PT Re-Evaluation 11/02/21    Authorization Type BC[employer]    PT Start Time 1306    PT Stop Time 1344    PT Time Calculation (min) 38 min    Activity Tolerance Patient tolerated treatment well    Behavior During Therapy WFL for tasks assessed/performed                 Past Medical History:  Diagnosis Date   Anxiety    Depression    Mouth ulcers    most of your life/ due to stress   Osteopenia    Osteoporosis    Past Surgical History:  Procedure Laterality Date   FOOT SURGERY  1985   right foot   HERNIA REPAIR     s   TONSILLECTOMY     42-60 years old   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   Primary osteoarthritis of both hands 02/22/2016   Primary osteoarthritis of both feet 02/22/2016   Fatigue 02/22/2016   Osteochondroma of foot, right 02/22/2016   Anxiety 07/15/2011    PCP: Corliss Parish  REFERRING PROVIDER: Lynne Leader  REFERRING DIAG: R shoulder   THERAPY DIAG:  Acute pain of right shoulder  Rationale for Evaluation and Treatment Rehabilitation  ONSET DATE: 3 weeks ago  SUBJECTIVE:                                                                                                                                                                                      SUBJECTIVE STATEMENT: 10/19/2021  Pt states overall doing very good,  less pain in shoulder.  Does have some muscle tension in R UT/levator  region.   Eval: 3 weeks ago, tripped, fell onto R arm/hand, arm went up overhead. Pt states  ongoing pain since incident, getting a bit better, is usually Better in AM.  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    PERTINENT HISTORY:   PAIN:  Are you having pain? Yes: NPRS scale: 3-4, up to 6-7/10  /10 Pain location: R shoulder/neck  Pain description: Sore Aggravating factors: lifting, reaching, carrying.  Relieving factors: rest   PRECAUTIONS: None  WEIGHT BEARING RESTRICTIONS  No  FALLS:  Has patient fallen in last 6 months? Yes. Number of falls 1   PLOF: Independent  PATIENT GOALS  decreased pain   OBJECTIVE:   DIAGNOSTIC FINDINGS:    COGNITION:  Overall cognitive status: Within functional limits for tasks assessed     POSTURE: R lateral   UPPER EXTREMITY ROM:   Shoulder ROM: WFL, mild soreness at mid and end ranges for flex and abd   UPPER EXTREMITY MMT:  MMT Right eval Left eval L 10/19/21  Shoulder flexion  4 5  Shoulder extension     Shoulder abduction  4- 5  Shoulder adduction     Shoulder internal rotation  4 5  Shoulder external rotation  4 5  Middle trapezius     Lower trapezius     Elbow flexion     Elbow extension     Wrist flexion     Wrist extension     Wrist ulnar deviation     Wrist radial deviation     Wrist pronation     Wrist supination     Grip strength (lbs)     (Blank rows = not tested)   JOINT MOBILITY TESTING:  WFL  PALPATION:     TODAY'S TREATMENT:  10/19/2021  Therapeutic Exercise: Aerobic: Supine: Shoulder AAROM x 15/cane;  Horiz abd 2lb  x20;  SA presses 3lb x 15;  S/L ER  2lb  x 10;  Quadruped SA press - review for HEP  Seated:  Standing:   Low row Rtb x 20;   AROM/scaption x 15;  Stretches:  pec stretch supine x 2 min, doorway at 90 deg, x 2 min;  Neuromuscular Re-education: Manual Therapy: STM/TPR to R UT and levator      PATIENT EDUCATION: Education details: reviewed and updated final  HEP Person educated: Patient Education method: Explanation, Demonstration, Tactile cues, Verbal  cues, and Handouts Education comprehension: verbalized understanding, returned demonstration, verbal cues required, tactile cues required, and needs further education   HOME EXERCISE PROGRAM: Access Code: [email or web address]  ASSESSMENT:  CLINICAL IMPRESSION: Patient showing good progress overall. She is doing most all regular functional activities without pain.  She has mild pain at end range for passive flex  and abd, but is improving and has minimal pain with AROM. She is doing well with progressive strengthening. She has mild tension in R UT region, likley from compensation with UE use. Final HEP updated today. Discussed d/c plan today, pt feels ready to continue with HEP, thinks she is doing well at this time. She has met all goals, and is ready for d/c.    OBJECTIVE IMPAIRMENTS decreased activity tolerance, decreased knowledge of use of DME, decreased mobility, decreased ROM, decreased strength, increased muscle spasms, impaired UE functional use, and pain.   ACTIVITY LIMITATIONS carrying, lifting, dressing, reach over head, and locomotion level  PARTICIPATION LIMITATIONS: meal prep, cleaning, laundry, driving, community activity, occupation, and yard work  PERSONAL FACTORS  none  are also affecting patient's functional outcome.   REHAB POTENTIAL: Good  CLINICAL DECISION MAKING: Stable/uncomplicated  EVALUATION COMPLEXITY: Low   GOALS: Goals reviewed with patient? Yes  SHORT TERM GOALS: Target date:   10/05/21   Pt to be independent with initial HEP  Goal status: MET  2.  Pt to report decreased pain to 0-4/10 with flex/abd ROM.  Goal status: MET    LONG TERM GOALS: Target date: 11/02/21   Pt to be independent with final HEP   Goal status: MET  2.  Pt to report decreased pain in R shoulder to 0-2/10 with overhead, reaching, lifting, and IADLs motions.   Goal status: MET  3.  Pt to demo improved strength of R shoulder to at least 4+/5 to improve ability for IADLs and  work duties.   Goal status: MET     PLAN: PT FREQUENCY: 2x/week  PT DURATION: 6 weeks  PLANNED INTERVENTIONS: Therapeutic exercises, Therapeutic activity, Neuromuscular re-education, Patient/Family education, Joint manipulation, Joint mobilization, DME instructions, Dry Needling, Spinal manipulation, Spinal mobilization, Cryotherapy, Moist heat, Splintting, Taping, Vasopneumatic device, Traction, Ultrasound, Ionotophoresis 29m/ml Dexamethasone, and Manual therapy  PLAN FOR NEXT SESSION:   LLyndee Hensen PT, DPT 1:51 PM  10/19/21     PHYSICAL THERAPY DISCHARGE SUMMARY  Visits from Start of Care: 6 Plan: Patient agrees to discharge.  Patient goals were met. Patient is being discharged due to meeting the stated rehab goals.     LLyndee Hensen PT, DPT 1:54 PM  10/19/21

## 2021-10-20 ENCOUNTER — Ambulatory Visit: Payer: BC Managed Care – PPO

## 2021-10-21 ENCOUNTER — Encounter: Payer: Self-pay | Admitting: Physical Therapy

## 2021-10-27 ENCOUNTER — Ambulatory Visit
Admission: RE | Admit: 2021-10-27 | Discharge: 2021-10-27 | Disposition: A | Discharge status: Home / Self Care | Payer: BC Managed Care – PPO | Source: Ambulatory Visit | Attending: Family Medicine | Admitting: Family Medicine

## 2021-10-27 DIAGNOSIS — Z1231 Encounter for screening mammogram for malignant neoplasm of breast: Secondary | ICD-10-CM

## 2021-11-24 ENCOUNTER — Ambulatory Visit: Payer: BC Managed Care – PPO | Admitting: Family Medicine

## 2021-12-01 ENCOUNTER — Ambulatory Visit: Payer: BC Managed Care – PPO | Admitting: Family Medicine

## 2021-12-02 ENCOUNTER — Encounter: Payer: Self-pay | Admitting: Family Medicine

## 2021-12-02 ENCOUNTER — Ambulatory Visit: Payer: BC Managed Care – PPO | Admitting: Family Medicine

## 2021-12-02 VITALS — BP 144/82 | HR 76 | Temp 98.2°F | Resp 16 | Ht 67.0 in | Wt 127.0 lb

## 2021-12-02 DIAGNOSIS — G4700 Insomnia, unspecified: Secondary | ICD-10-CM | POA: Diagnosis not present

## 2021-12-02 DIAGNOSIS — M25461 Effusion, right knee: Secondary | ICD-10-CM | POA: Diagnosis not present

## 2021-12-02 DIAGNOSIS — D509 Iron deficiency anemia, unspecified: Secondary | ICD-10-CM

## 2021-12-02 DIAGNOSIS — F411 Generalized anxiety disorder: Secondary | ICD-10-CM | POA: Diagnosis not present

## 2021-12-02 MED ORDER — FLUOXETINE HCL 20 MG PO CAPS: 20.0000 mg | ORAL_CAPSULE | Freq: Every day | ORAL | 3 refills | Status: DC

## 2021-12-02 NOTE — Progress Notes (Signed)
Subjective:  Patient ID: Lindsey Sanford, female    DOB: 1960/07/31  Age: 61 y.o. MRN: [id number]  CC:  Chief Complaint  Patient presents with   Anxiety    Pt notes Hydroxyzine is very rare use no need for refills, doing well on prozac     HPI Lindsey Sanford presents for   Generalized anxiety disorder: Treated with fluoxetine.  Doing well.  Rare need for hydroxyzine. Being evaluated to donate a kidney. Some stress. Feels better with less alcohol intake. Prior up to 14 per week. No drink since July 30th. No withdrawal symptoms.  Has taken a few years to recover from stressors in pandemic.  Exercise - 3-4 days per week.  Better sleep, mood, anxiety better overall.     06/03/2021   10:53 AM 11/26/2020   10:53 AM 05/22/2020    9:36 AM 11/20/2019    8:13 AM  GAD 7 : Generalized Anxiety Score  Nervous, Anxious, on Edge '1 1 1 2  '$ Control/stop worrying 0 1 0 1  Worry too much - different things 0 0 1 2  Trouble relaxing '1 2 2 2  '$ Restless '1 2 2 1  '$ Easily annoyed or irritable '1 1 1 1  '$ Afraid - awful might happen 0 '1 1 1  '$ Total GAD 7 Score '4 8 8 10   '$ R knee swelling Front of left knee for months -  new concern.  Fall in May onto front of knee. No knee pain, but had some bruising, still small area of fluid in front of knee. Puffiness about the same. No redness/pain/limitations in motion or activity. Able to apply pressure on knee without issue.   Anemia Iron deficiency On iron supplementation at times. Bloodwork at [place] on 8/16 - HGB 13.9.  Lab Results  Component Value Date   WBC 9.3 07/22/2021   HGB 13.0 07/22/2021   HCT 39.7 07/22/2021   MCV 85.2 07/22/2021   PLT 338.0 07/22/2021    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    Primary osteoarthritis of both hands 02/22/2016   Primary osteoarthritis of both feet 02/22/2016   Fatigue 02/22/2016   Osteochondroma of foot, right 02/22/2016   Anxiety 07/15/2011   Past Medical History:  Diagnosis Date   Anxiety    Depression    Mouth ulcers    most of your life/ due to stress   Osteopenia    Osteoporosis    Past Surgical History:  Procedure Laterality Date   FOOT SURGERY  1985   right foot   HERNIA REPAIR     s   TONSILLECTOMY     79-36 years old   Allergies  Allergen Reactions   Nitrofurantoin Monohyd Macro     Lip swelling   Wasp Venom Hives   Prior to Admission medications   Medication Sig Start Date End Date Taking? Authorizing Provider  Biotin 1 MG CAPS Take by mouth.   Yes [provider]  Calcium-Vitamin D (CALTRATE 600 PLUS-VIT D PO) Take by mouth.   Yes [provider]  EPINEPHrine (AUVI-Q) 0.3 mg/0.3 mL IJ SOAJ injection Inject 0.3 mg into the muscle as needed for anaphylaxis. 02/17/21  Yes Garnet Sierras, DO  estradiol (ESTRACE) 0.1 MG/GM vaginal cream Insert  vaginally twice weekly 08/05/21  Yes Juleen China, Tiffany A, NP  FLUoxetine (PROZAC) 20 MG capsule Take 1 capsule (20 mg total) by mouth daily. 06/03/21  Yes Wendie Agreste, MD  hydrOXYzine (ATARAX) 25 MG tablet Take 0.5-1 tablets (12.5-25 mg total) by mouth at bedtime as needed for anxiety. 06/03/21  Yes Wendie Agreste, MD  Iron-Vitamin C (IRON 100/C PO) Take by mouth.   Yes [provider]  Multiple Vitamin (MULTIVITAMIN) tablet Take 1 tablet by mouth daily.   Yes [provider]  VITAMIN D PO Take by mouth.   Yes [provider]   Social History   Socioeconomic History   Marital status: Married    Spouse name: Not on file   Number of children: Not on file   Years of education: Not on file   Highest education level: Not on file  Occupational History   Not on file  Tobacco Use   Smoking status: Never   Smokeless tobacco: Never  Vaping  Use   Vaping Use: Never used  Substance and Sexual Activity   Alcohol use: Yes    Alcohol/week: 4.0 standard drinks of alcohol    Types: 4 Glasses of wine per week   Drug use: No   Sexual activity: Yes    Birth control/protection: Post-menopausal    Comment: INTERCOUSRE AGE 50, SEXUAL PARTNERS LESS THAN 5  Other Topics Concern   Not on file  Social History Narrative   Not on file   Social Determinants of Health   Financial Resource Strain: Not on file  Food Insecurity: Not on file  Transportation Needs: Not on file  Physical Activity: Not on file  Stress: Not on file  Social Connections: Not on file  Intimate Partner Violence: Not on file    Review of Systems   Objective:   Vitals:   12/02/21 1355  BP: (!) 162/80  Pulse: 76  Resp: 16  Temp: 98.2 F (36.8 C)  TempSrc: Oral  SpO2: 97%  Weight: 127 lb (57.6 kg)  Height: '5\' 7"'$  (1.702 m)     Physical Exam Constitutional:      General: She is not in acute distress.    Appearance: Normal appearance. She is well-developed.  HENT:     Head: Normocephalic and atraumatic.  Cardiovascular:     Rate and Rhythm: Normal rate.  Pulmonary:     Effort: Pulmonary effort is normal.  Musculoskeletal:     Comments: Right knee, skin intact, full range of motion, pain-free range of motion, no focal bony tenderness.  Prominent prepatellar bursa but soft, nontender.  No warmth.  Neurological:     Mental Status: She is alert and oriented to person, place, and time.  Psychiatric:        Mood and Affect: Mood normal.        Behavior: Behavior normal.        Thought Content: Thought content normal.        Assessment & Plan:  Lindsey Sanford is a 61 y.o. female . Effusion of right prepatellar bursa  -Nontender, likely posttraumatic.  She declined aspiration or further treatment at this time is not painful or limiting.  RTC precautions.  Generalized anxiety disorder - Plan: FLUoxetine (PROZAC) 20 MG capsule  -Improved  status post decreased alcohol intake and on current medications.  Has hydroxyzine if needed for insomnia.  Continue same.  Insomnia, unspecified type - Plan: FLUoxetine (PROZAC) 20 MG capsule  -Hydroxyzine if needed  Iron deficiency  anemia  -Recent CBC with normal hemoglobin noted, continue supplementation.  5-monthfollow-up for physical. Meds ordered this encounter  Medications   FLUoxetine (PROZAC) 20 MG capsule    Sig: Take 1 capsule (20 mg total) by mouth daily.    Dispense:  90 capsule    Refill:  3   Patient Instructions  Swelling in front of right knee Is the prepatellar bursa.  As it is not painful or bothering you at this time I would not recommend any specific treatment, but we have the option to draw some of that fluid out if you would like.  Let me know.  No other change in medications at this time.  Follow-up in 6 months for physical.  Take care.     Signed,   JMerri Ray MD [facility] 12/02/21 2:34 PM

## 2021-12-02 NOTE — Patient Instructions (Signed)
Swelling in front of right knee Is the prepatellar bursa.  As it is not painful or bothering you at this time I would not recommend any specific treatment, but we have the option to draw some of that fluid out if you would like.  Let me know.  No other change in medications at this time.  Follow-up in 6 months for physical.  Take care.

## 2021-12-16 ENCOUNTER — Telehealth: Payer: Self-pay | Admitting: Family Medicine

## 2021-12-16 NOTE — Telephone Encounter (Signed)
Caller name: Mahati  On DPR? :yes/no: Yes  Call back number: [phone]  Provider they see: Carlota Raspberry  Reason for call:  Brought in paperwork in for [employer] for Anne Fu MRN [id number] (she is the legal guardian for Lindsey Sanford) states that she wants to help Dr. Carlota Raspberry to better fill out the paperwork; states that pt cannot cross the street on her own; pt is also "prime candidate for exploitation"; cannot ride the bus on her own b/c she gets confused.

## 2021-12-21 NOTE — Telephone Encounter (Signed)
Completed paperwork

## 2022-01-10 ENCOUNTER — Ambulatory Visit (INDEPENDENT_AMBULATORY_CARE_PROVIDER_SITE_OTHER): Payer: BC Managed Care – PPO | Admitting: Family Medicine

## 2022-01-10 DIAGNOSIS — Z23 Encounter for immunization: Secondary | ICD-10-CM

## 2022-01-10 NOTE — Progress Notes (Signed)
Pt received her flu vaccine today tolerated well

## 2022-02-24 ENCOUNTER — Ambulatory Visit: Payer: BC Managed Care – PPO | Admitting: Family Medicine

## 2022-02-24 ENCOUNTER — Encounter: Payer: Self-pay | Admitting: Family Medicine

## 2022-02-24 VITALS — BP 140/70 | HR 74 | Temp 98.2°F | Ht 67.0 in | Wt 129.4 lb

## 2022-02-24 DIAGNOSIS — D509 Iron deficiency anemia, unspecified: Secondary | ICD-10-CM

## 2022-02-24 DIAGNOSIS — I10 Essential (primary) hypertension: Secondary | ICD-10-CM

## 2022-02-24 DIAGNOSIS — E871 Hypo-osmolality and hyponatremia: Secondary | ICD-10-CM

## 2022-02-24 LAB — BASIC METABOLIC PANEL
BUN: 16 mg/dL (ref 6–23)
CO2: 26 mEq/L (ref 19–32)
Calcium: 9.1 mg/dL (ref 8.4–10.5)
Chloride: 96 mEq/L (ref 96–112)
Creatinine, Ser: 0.78 mg/dL (ref 0.40–1.20)
GFR: 82.14 mL/min (ref >60.00)
Glucose, Bld: 82 mg/dL (ref 70–99)
Potassium: 3.9 mEq/L (ref 3.5–5.1)
Sodium: 129 mEq/L — ABNORMAL LOW (ref 135–145)

## 2022-02-24 LAB — CBC
HCT: 37.3 % (ref 36.0–46.0)
Hemoglobin: 12.2 g/dL (ref 12.0–15.0)
MCHC: 32.7 g/dL (ref 30.0–36.0)
MCV: 82.3 fl (ref 78.0–100.0)
Platelets: 331 10*3/uL (ref 150.0–400.0)
RBC: 4.54 Mil/uL (ref 3.87–5.11)
RDW: 15.4 % (ref 11.5–15.5)
WBC: 8.7 10*3/uL (ref 4.0–10.5)

## 2022-02-24 MED ORDER — AMLODIPINE BESYLATE 2.5 MG PO TABS: 2.5000 mg | ORAL_TABLET | Freq: Every day | ORAL | 0 refills | Status: DC

## 2022-02-24 NOTE — Patient Instructions (Signed)
Based on overall elevated readings and blood pressure today I think it would be advantageous to start a low-dose blood pressure medication.  Start amlodipine 2.5 mg once per day and follow-up with me in the next month with some home readings.  I do not expect any side effects with that medication but please let me know if this to occur.  Hang in there.  Let me know if I can help.  Managing Your Hypertension Hypertension, also called high blood pressure, is when the force of the blood pressing against the walls of the arteries is too strong. Arteries are blood vessels that carry blood from your heart throughout your body. Hypertension forces the heart to work harder to pump blood and may cause the arteries to become narrow or stiff. Understanding blood pressure readings A blood pressure reading includes a higher number over a lower number: The first, or top, number is called the systolic pressure. It is a measure of the pressure in your arteries as your heart beats. The second, or bottom number, is called the diastolic pressure. It is a measure of the pressure in your arteries as the heart relaxes. For most people, a normal blood pressure is below 120/80. Your personal target blood pressure may vary depending on your medical conditions, your age, and other factors. Blood pressure is classified into four stages. Based on your blood pressure reading, your health care provider may use the following stages to determine what type of treatment you need, if any. Systolic pressure and diastolic pressure are measured in a unit called millimeters of mercury (mmHg). Normal Systolic pressure: below 161. Diastolic pressure: below 80. Elevated Systolic pressure: 096-045. Diastolic pressure: below 80. Hypertension stage 1 Systolic pressure: 409-811. Diastolic pressure: 91-47. Hypertension stage 2 Systolic pressure: 829 or above. Diastolic pressure: 90 or above. How can this condition affect me? Managing your  hypertension is very important. Over time, hypertension can damage the arteries and decrease blood flow to parts of the body, including the brain, heart, and kidneys. Having untreated or uncontrolled hypertension can lead to: A heart attack. A stroke. A weakened blood vessel (aneurysm). Heart failure. Kidney damage. Eye damage. Memory and concentration problems. Vascular dementia. What actions can I take to manage this condition? Hypertension can be managed by making lifestyle changes and possibly by taking medicines. Your health care provider will help you make a plan to bring your blood pressure within a normal range. You may be referred for counseling on a healthy diet and physical activity. Nutrition  Eat a diet that is high in fiber and potassium, and low in salt (sodium), added sugar, and fat. An example eating plan is called the DASH diet. DASH stands for Dietary Approaches to Stop Hypertension. To eat this way: Eat plenty of fresh fruits and vegetables. Try to fill one-half of your plate at each meal with fruits and vegetables. Eat whole grains, such as whole-wheat pasta, brown rice, or whole-grain bread. Fill about one-fourth of your plate with whole grains. Eat low-fat dairy products. Avoid fatty cuts of meat, processed or cured meats, and poultry with skin. Fill about one-fourth of your plate with lean proteins such as fish, chicken without skin, beans, eggs, and tofu. Avoid pre-made and processed foods. These tend to be higher in sodium, added sugar, and fat. Reduce your daily sodium intake. Many people with hypertension should eat less than 1,500 mg of sodium a day. Lifestyle  Work with your health care provider to maintain a healthy body weight or  to lose weight. Ask what an ideal weight is for you. Get at least 30 minutes of exercise that causes your heart to beat faster (aerobic exercise) most days of the week. Activities may include walking, swimming, or biking. Include  exercise to strengthen your muscles (resistance exercise), such as weight lifting, as part of your weekly exercise routine. Try to do these types of exercises for 30 minutes at least 3 days a week. Do not use any products that contain nicotine or tobacco. These products include cigarettes, chewing tobacco, and vaping devices, such as e-cigarettes. If you need help quitting, ask your health care provider. Control any long-term (chronic) conditions you have, such as high cholesterol or diabetes. Identify your sources of stress and find ways to manage stress. This may include meditation, deep breathing, or making time for fun activities. Alcohol use Do not drink alcohol if: Your health care provider tells you not to drink. You are pregnant, may be pregnant, or are planning to become pregnant. If you drink alcohol: Limit how much you have to: 0-1 drink a day for women. 0-2 drinks a day for men. Know how much alcohol is in your drink. In the [place] one drink equals one 12 oz bottle of beer (355 mL), one 5 oz glass of wine (148 mL), or one 1 oz glass of hard liquor (44 mL). Medicines Your health care provider may prescribe medicine if lifestyle changes are not enough to get your blood pressure under control and if: Your systolic blood pressure is 130 or higher. Your diastolic blood pressure is 80 or higher. Take medicines only as told by your health care provider. Follow the directions carefully. Blood pressure medicines must be taken as told by your health care provider. The medicine does not work as well when you skip doses. Skipping doses also puts you at risk for problems. Monitoring Before you monitor your blood pressure: Do not smoke, drink caffeinated beverages, or exercise within 30 minutes before taking a measurement. Use the bathroom and empty your bladder (urinate). Sit quietly for at least 5 minutes before taking measurements. Monitor your blood pressure at home as told by your health  care provider. To do this: Sit with your back straight and supported. Place your feet flat on the floor. Do not cross your legs. Support your arm on a flat surface, such as a table. Make sure your upper arm is at heart level. Each time you measure, take two or three readings one minute apart and record the results. You may also need to have your blood pressure checked regularly by your health care provider. General information Talk with your health care provider about your diet, exercise habits, and other lifestyle factors that may be contributing to hypertension. Review all the medicines you take with your health care provider because there may be side effects or interactions. Keep all follow-up visits. Your health care provider can help you create and adjust your plan for managing your high blood pressure. Where to find more information National Heart, Lung, and Blood Institute: https://wilson-eaton.com/ American Heart Association: www.heart.org Contact a health care provider if: You think you are having a reaction to medicines you have taken. You have repeated (recurrent) headaches. You feel dizzy. You have swelling in your ankles. You have trouble with your vision. Get help right away if: You develop a severe headache or confusion. You have unusual weakness or numbness, or you feel faint. You have severe pain in your chest or abdomen. You vomit repeatedly. You  have trouble breathing. These symptoms may be an emergency. Get help right away. Call 911. Do not wait to see if the symptoms will go away. Do not drive yourself to the hospital. Summary Hypertension is when the force of blood pumping through your arteries is too strong. If this condition is not controlled, it may put you at risk for serious complications. Your personal target blood pressure may vary depending on your medical conditions, your age, and other factors. For most people, a normal blood pressure is less than  120/80. Hypertension is managed by lifestyle changes, medicines, or both. Lifestyle changes to help manage hypertension include losing weight, eating a healthy, low-sodium diet, exercising more, stopping smoking, and limiting alcohol. This information is not intended to replace advice given to you by your health care provider. Make sure you discuss any questions you have with your health care provider. Document Revised: 12/10/2020 Document Reviewed: 12/10/2020 Elsevier Patient Education  [place].

## 2022-02-24 NOTE — Progress Notes (Signed)
Subjective:  Patient ID: Lindsey Sanford, female    DOB: May 05, 1960  Age: 61 y.o. MRN: [id number]  CC:  Chief Complaint  Patient presents with   Discuss donor eval    Pt husband needs a kidney and wants to know if she could be a good match    HPI Lindsey Sanford presents for   Husband's need of kidney donation, unsure if she would be a good match. History of iron deficiency anemia, treated with oral iron supplementation. Stable on recent testing when discussed in August. Had recetly donated blood at time of testing at [facility].  She was evaluated for donation - had nuclear scan for kidney function as well other  R side 49%, 51% on left. Ok function on her own, but just not enough for donation. Wants to make sure we continue to monitor her kidney function.  Was advised to bring copy of 24 hour readings as there were some concern with those readings. Hgb 13.9 on 8/16, and then after donating blood - 1 week later had CBC with HGB 11.1. donates blood 4 times per year.  Na low at 132 on 11/24/21. Creat 0.81, with eGFR 83 Paperwork noted, with 24-hour blood pressure monitoring, average 132/72.  Awake summary 137/74, sleep summary 123/68.   No meds for HTN.  Trying to exercise, difficult with life stressors.  No added salt to food.  Mostly cooking at home - rare restaurant/takeout food.   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   Primary  osteoarthritis of both hands 02/22/2016   Primary osteoarthritis of both feet 02/22/2016   Fatigue 02/22/2016   Osteochondroma of foot, right 02/22/2016   Anxiety 07/15/2011   Past Medical History:  Diagnosis Date   Anxiety    Depression    Mouth ulcers    most of your life/ due to stress   Osteopenia    Osteoporosis    Past Surgical History:  Procedure Laterality Date   FOOT SURGERY  1985   right foot   HERNIA REPAIR     s   TONSILLECTOMY     5-41 years old   Allergies  Allergen Reactions   Nitrofurantoin Monohyd Macro     Lip swelling   Wasp Venom Hives   Prior to Admission medications   Medication Sig Start Date End Date Taking? Authorizing Provider  Calcium-Vitamin D (CALTRATE 600 PLUS-VIT D PO) Take by mouth.   Yes [provider]  EPINEPHrine (AUVI-Q) 0.3 mg/0.3 mL IJ SOAJ injection Inject 0.3 mg into the muscle as needed for anaphylaxis. 02/17/21  Yes Garnet Sierras, DO  FLUoxetine (PROZAC) 20 MG capsule Take 1 capsule (20 mg total) by mouth daily. 12/02/21  Yes Wendie Agreste, MD  Iron-Vitamin C (IRON 100/C PO) Take by mouth.   Yes [provider]  Multiple Vitamin (MULTIVITAMIN) tablet Take 1 tablet by mouth  daily.   Yes [provider]  VITAMIN D PO Take by mouth.   Yes [provider]  Biotin 1 MG CAPS Take by mouth. Patient not taking: Reported on 02/24/2022    [provider]  Cholecalciferol (VITAMIN D-1000 MAX ST) 25 MCG (1000 UT) tablet Take by mouth.    [provider]  estradiol (ESTRACE) 0.1 MG/GM vaginal cream Insert vaginally twice weekly Patient not taking: Reported on 02/24/2022 08/05/21   Marny Lowenstein A, NP  estradiol (ESTRACE) 0.1 MG/GM vaginal cream Place vaginally.    [provider]  hydrOXYzine (ATARAX) 25 MG tablet Take 0.5-1 tablets (12.5-25 mg total) by mouth at bedtime as needed for anxiety. Patient not taking: Reported on 02/24/2022 06/03/21   Wendie Agreste, MD   Social  History   Socioeconomic History   Marital status: Married    Spouse name: Not on file   Number of children: Not on file   Years of education: Not on file   Highest education level: Not on file  Occupational History   Not on file  Tobacco Use   Smoking status: Never   Smokeless tobacco: Never  Vaping Use   Vaping Use: Never used  Substance and Sexual Activity   Alcohol use: Yes    Alcohol/week: 4.0 standard drinks of alcohol    Types: 4 Glasses of wine per week   Drug use: No   Sexual activity: Yes    Birth control/protection: Post-menopausal    Comment: INTERCOUSRE AGE 19, SEXUAL PARTNERS LESS THAN 5  Other Topics Concern   Not on file  Social History Narrative   Not on file   Social Determinants of Health   Financial Resource Strain: Not on file  Food Insecurity: Not on file  Transportation Needs: Not on file  Physical Activity: Not on file  Stress: Not on file  Social Connections: Not on file  Intimate Partner Violence: Not on file    Review of Systems  Constitutional:  Negative for fatigue and unexpected weight change.  Respiratory:  Negative for chest tightness and shortness of breath.   Cardiovascular:  Negative for chest pain, palpitations and leg swelling.  Gastrointestinal:  Negative for abdominal pain and blood in stool.  Neurological:  Negative for dizziness, syncope, light-headedness and headaches.     Objective:   Vitals:   02/24/22 1123  BP: (!) 140/70  Pulse: 74  Temp: 98.2 F (36.8 C)  SpO2: 99%  Weight: 129 lb 6.4 oz (58.7 kg)  Height: _0  (1.702 m)     Physical Exam Vitals reviewed.  Constitutional:      Appearance: Normal appearance. She is well-developed.  HENT:     Head: Normocephalic and atraumatic.  Eyes:     Conjunctiva/sclera: Conjunctivae normal.     Pupils: Pupils are equal, round, and reactive to light.  Neck:     Vascular: No carotid bruit.  Cardiovascular:     Rate and Rhythm: Normal rate and regular rhythm.      Heart sounds: Normal heart sounds.  Pulmonary:     Effort: Pulmonary effort is normal.     Breath sounds: Normal breath sounds.  Abdominal:     Palpations: Abdomen is soft. There is no pulsatile mass.     Tenderness: There is no abdominal tenderness.  Musculoskeletal:     Right lower leg: No edema.     Left lower leg: No edema.  Skin:    General: Skin is warm and dry.  Neurological:  Mental Status: She is alert and oriented to person, place, and time.  Psychiatric:        Mood and Affect: Mood normal.        Behavior: Behavior normal.     Assessment & Plan:  Lindsey Sanford is a 61 y.o. female . Essential hypertension - Plan: amLODipine (NORVASC) 2.5 MG tablet  -Based on ambulatory blood pressures and 2 elevated readings above 140/90 will start medication, especially with information she was given regarding renal function.  Creatinine, EGFR reassuring.  Start low-dose amlodipine 2.5 mg daily with potential side effects discussed, handout given on management of hypertension with recheck in 1 month.  Hyponatremia - Plan: Basic metabolic panel  -Mild on recent labs, repeat testing.  Iron deficiency anemia, unspecified iron deficiency anemia type - Plan: CBC  -Likely secondary to blood donation, repeat CBC to evaluate improvement since blood work drawn soon after her last donation.  Plans to decrease frequency of blood donation.  Over-the-counter iron supplementation can continue as well.  Meds ordered this encounter  Medications   amLODipine (NORVASC) 2.5 MG tablet    Sig: Take 1 tablet (2.5 mg total) by mouth daily.    Dispense:  90 tablet    Refill:  0   Patient Instructions  Based on overall elevated readings and blood pressure today I think it would be advantageous to start a low-dose blood pressure medication.  Start amlodipine 2.5 mg once per day and follow-up with me in the next month with some home readings.  I do not expect any side effects with that medication but  please let me know if this to occur.  Hang in there.  Let me know if I can help.  Managing Your Hypertension Hypertension, also called high blood pressure, is when the force of the blood pressing against the walls of the arteries is too strong. Arteries are blood vessels that carry blood from your heart throughout your body. Hypertension forces the heart to work harder to pump blood and may cause the arteries to become narrow or stiff. Understanding blood pressure readings A blood pressure reading includes a higher number over a lower number: The first, or top, number is called the systolic pressure. It is a measure of the pressure in your arteries as your heart beats. The second, or bottom number, is called the diastolic pressure. It is a measure of the pressure in your arteries as the heart relaxes. For most people, a normal blood pressure is below 120/80. Your personal target blood pressure may vary depending on your medical conditions, your age, and other factors. Blood pressure is classified into four stages. Based on your blood pressure reading, your health care provider may use the following stages to determine what type of treatment you need, if any. Systolic pressure and diastolic pressure are measured in a unit called millimeters of mercury (mmHg). Normal Systolic pressure: below 629. Diastolic pressure: below 80. Elevated Systolic pressure: 528-413. Diastolic pressure: below 80. Hypertension stage 1 Systolic pressure: 244-010. Diastolic pressure: 27-25. Hypertension stage 2 Systolic pressure: 366 or above. Diastolic pressure: 90 or above. How can this condition affect me? Managing your hypertension is very important. Over time, hypertension can damage the arteries and decrease blood flow to parts of the body, including the brain, heart, and kidneys. Having untreated or uncontrolled hypertension can lead to: A heart attack. A stroke. A weakened blood vessel (aneurysm). Heart  failure. Kidney damage. Eye damage. Memory and concentration problems. Vascular dementia. What actions can  I take to manage this condition? Hypertension can be managed by making lifestyle changes and possibly by taking medicines. Your health care provider will help you make a plan to bring your blood pressure within a normal range. You may be referred for counseling on a healthy diet and physical activity. Nutrition  Eat a diet that is high in fiber and potassium, and low in salt (sodium), added sugar, and fat. An example eating plan is called the DASH diet. DASH stands for Dietary Approaches to Stop Hypertension. To eat this way: Eat plenty of fresh fruits and vegetables. Try to fill one-half of your plate at each meal with fruits and vegetables. Eat whole grains, such as whole-wheat pasta, brown rice, or whole-grain bread. Fill about one-fourth of your plate with whole grains. Eat low-fat dairy products. Avoid fatty cuts of meat, processed or cured meats, and poultry with skin. Fill about one-fourth of your plate with lean proteins such as fish, chicken without skin, beans, eggs, and tofu. Avoid pre-made and processed foods. These tend to be higher in sodium, added sugar, and fat. Reduce your daily sodium intake. Many people with hypertension should eat less than 1,500 mg of sodium a day. Lifestyle  Work with your health care provider to maintain a healthy body weight or to lose weight. Ask what an ideal weight is for you. Get at least 30 minutes of exercise that causes your heart to beat faster (aerobic exercise) most days of the week. Activities may include walking, swimming, or biking. Include exercise to strengthen your muscles (resistance exercise), such as weight lifting, as part of your weekly exercise routine. Try to do these types of exercises for 30 minutes at least 3 days a week. Do not use any products that contain nicotine or tobacco. These products include cigarettes, chewing  tobacco, and vaping devices, such as e-cigarettes. If you need help quitting, ask your health care provider. Control any long-term (chronic) conditions you have, such as high cholesterol or diabetes. Identify your sources of stress and find ways to manage stress. This may include meditation, deep breathing, or making time for fun activities. Alcohol use Do not drink alcohol if: Your health care provider tells you not to drink. You are pregnant, may be pregnant, or are planning to become pregnant. If you drink alcohol: Limit how much you have to: 0-1 drink a day for women. 0-2 drinks a day for men. Know how much alcohol is in your drink. In the [place] one drink equals one 12 oz bottle of beer (355 mL), one 5 oz glass of wine (148 mL), or one 1 oz glass of hard liquor (44 mL). Medicines Your health care provider may prescribe medicine if lifestyle changes are not enough to get your blood pressure under control and if: Your systolic blood pressure is 130 or higher. Your diastolic blood pressure is 80 or higher. Take medicines only as told by your health care provider. Follow the directions carefully. Blood pressure medicines must be taken as told by your health care provider. The medicine does not work as well when you skip doses. Skipping doses also puts you at risk for problems. Monitoring Before you monitor your blood pressure: Do not smoke, drink caffeinated beverages, or exercise within 30 minutes before taking a measurement. Use the bathroom and empty your bladder (urinate). Sit quietly for at least 5 minutes before taking measurements. Monitor your blood pressure at home as told by your health care provider. To do this: Sit with your back  straight and supported. Place your feet flat on the floor. Do not cross your legs. Support your arm on a flat surface, such as a table. Make sure your upper arm is at heart level. Each time you measure, take two or three readings one minute apart and  record the results. You may also need to have your blood pressure checked regularly by your health care provider. General information Talk with your health care provider about your diet, exercise habits, and other lifestyle factors that may be contributing to hypertension. Review all the medicines you take with your health care provider because there may be side effects or interactions. Keep all follow-up visits. Your health care provider can help you create and adjust your plan for managing your high blood pressure. Where to find more information National Heart, Lung, and Blood Institute: https://wilson-eaton.com/ American Heart Association: www.heart.org Contact a health care provider if: You think you are having a reaction to medicines you have taken. You have repeated (recurrent) headaches. You feel dizzy. You have swelling in your ankles. You have trouble with your vision. Get help right away if: You develop a severe headache or confusion. You have unusual weakness or numbness, or you feel faint. You have severe pain in your chest or abdomen. You vomit repeatedly. You have trouble breathing. These symptoms may be an emergency. Get help right away. Call 911. Do not wait to see if the symptoms will go away. Do not drive yourself to the hospital. Summary Hypertension is when the force of blood pumping through your arteries is too strong. If this condition is not controlled, it may put you at risk for serious complications. Your personal target blood pressure may vary depending on your medical conditions, your age, and other factors. For most people, a normal blood pressure is less than 120/80. Hypertension is managed by lifestyle changes, medicines, or both. Lifestyle changes to help manage hypertension include losing weight, eating a healthy, low-sodium diet, exercising more, stopping smoking, and limiting alcohol. This information is not intended to replace advice given to you by your health  care provider. Make sure you discuss any questions you have with your health care provider. Document Revised: 12/10/2020 Document Reviewed: 12/10/2020 Elsevier Patient Education  2023 [place],   Merri Ray, MD Ginger Blue, [facility] 02/24/22 12:26 PM

## 2022-03-01 ENCOUNTER — Other Ambulatory Visit: Payer: Self-pay | Admitting: Family Medicine

## 2022-03-01 DIAGNOSIS — E871 Hypo-osmolality and hyponatremia: Secondary | ICD-10-CM

## 2022-03-01 NOTE — Progress Notes (Signed)
See labs 

## 2022-03-02 ENCOUNTER — Other Ambulatory Visit: Payer: Self-pay

## 2022-03-02 ENCOUNTER — Telehealth: Payer: Self-pay

## 2022-03-02 NOTE — Telephone Encounter (Signed)
-----   Message from Wendie Agreste, MD sent at 03/01/2022  9:00 PM EST ----- Results sent by MyChart but please call her and check to see if she is having any symptoms of low sodium.  These would include vomiting, nausea, fatigue, lethargy, confusion, forgetfulness, headache, dizziness, gait disturbance, muscle cramps.  If so, she should be seen through the ER as may need treatment of the low sodium.  If no symptoms, then can cut back slightly on water, add some salt in the diet, repeat sodium with lab visit tomorrow or Monday at the latest.

## 2022-03-02 NOTE — Telephone Encounter (Signed)
Informed pt of lab results she states she is feeling fine . Coming in on Monday 03/07/22 @ 815 am for lab repeat for BMP and order is in

## 2022-03-07 ENCOUNTER — Other Ambulatory Visit (INDEPENDENT_AMBULATORY_CARE_PROVIDER_SITE_OTHER): Payer: BC Managed Care – PPO

## 2022-03-07 DIAGNOSIS — E871 Hypo-osmolality and hyponatremia: Secondary | ICD-10-CM

## 2022-03-07 LAB — BASIC METABOLIC PANEL
BUN: 17 mg/dL (ref 6–23)
CO2: 27 mEq/L (ref 19–32)
Calcium: 9 mg/dL (ref 8.4–10.5)
Chloride: 97 mEq/L (ref 96–112)
Creatinine, Ser: 0.87 mg/dL (ref 0.40–1.20)
GFR: 72.04 mL/min (ref >60.00)
Glucose, Bld: 89 mg/dL (ref 70–99)
Potassium: 4 mEq/L (ref 3.5–5.1)
Sodium: 129 mEq/L — ABNORMAL LOW (ref 135–145)

## 2022-03-08 ENCOUNTER — Encounter: Payer: Self-pay | Admitting: Family Medicine

## 2022-03-16 ENCOUNTER — Encounter: Payer: BC Managed Care – PPO | Admitting: Family Medicine

## 2022-04-09 ENCOUNTER — Encounter: Payer: Self-pay | Admitting: Family Medicine

## 2022-04-12 NOTE — Telephone Encounter (Signed)
Pt is asking if she is okay to volunteer with a group for meditative breath work considering history of BP, anxiety   Please advise if patient is okay to participate

## 2022-04-13 ENCOUNTER — Telehealth (INDEPENDENT_AMBULATORY_CARE_PROVIDER_SITE_OTHER): Payer: BC Managed Care – PPO | Admitting: Family Medicine

## 2022-04-13 ENCOUNTER — Encounter: Payer: Self-pay | Admitting: Family Medicine

## 2022-04-13 VITALS — BP 132/71

## 2022-04-13 DIAGNOSIS — I10 Essential (primary) hypertension: Secondary | ICD-10-CM | POA: Diagnosis not present

## 2022-04-13 DIAGNOSIS — E871 Hypo-osmolality and hyponatremia: Secondary | ICD-10-CM

## 2022-04-13 NOTE — Progress Notes (Signed)
Virtual Visit via Video Note  I connected with Lindsey Sanford on 04/13/22 at 11:46 AM by a video enabled telemedicine application and verified that I am speaking with the correct person using two identifiers.  Patient location: home, by self.  My location: office - [place].    I discussed the limitations, risks, security and privacy concerns of performing an evaluation and management service by telephone and the availability of in person appointments. I also discussed with the patient that there may be a patient responsible charge related to this service. The patient expressed understanding and agreed to proceed, consent obtained  Chief complaint:  Chief Complaint  Patient presents with   Hypertension    Follow up new amlodipine and follow up on sodium, pt reports her BP today is 132/71 has not taken otherwise, denies physical sxs, no questions from pt     History of Present Illness: Lindsey Sanford is a 62 y.o. female   Hypertension: Follow-up hypertension, started on amlodipine 2.5 mg daily at her November 16 visit.  Ambulatory blood pressure monitoring at that time with average of 132/72.  In office readings were higher.  Goal of maintaining kidney function.  Had some testing with evaluation of kidney function for possible donation, function was sufficient but not enough for donation. Home readings: 132/71 today. No new med side effects. Feels well. Feels less palpitations.  Some cold symptoms recently. Head congestion past 2 days. No fever or bodyache. No covid testing.  Constitutional: Negative for fatigue and unexpected weight change.  Eyes: Negative for visual disturbance.  Respiratory: Negative for chest tightness and shortness of breath.   Cardiovascular: Negative for chest pain, palpitations and leg swelling.  Gastrointestinal: Negative for abdominal pain and blood in stool.  Neurological: Negative for dizziness, light-headedness and headaches.     BP  Readings from Last 3 Encounters:  04/13/22 132/71  02/24/22 (!) 140/70  12/02/21 (!) 144/82   Lab Results  Component Value Date   CREATININE 0.87 03/07/2022   Hyponatremia: Sodium 129 on November 16 and on repeat testing November 27.  Question of overconsumption of water, and had been fairly aggressive on salt avoidance in the diet.  Option to liberalize salt somewhat. Drinking decaf coffee. Has cut back on water and some V8, pretzels. Minimal salt in foods.   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   Primary osteoarthritis of both hands 02/22/2016   Primary osteoarthritis of both feet 02/22/2016   Fatigue 02/22/2016   Osteochondroma of foot, right 02/22/2016   Anxiety 07/15/2011   Past Medical History:  Diagnosis Date   Anxiety    Depression    Mouth ulcers    most of your life/ due to stress   Osteopenia    Osteoporosis    Past Surgical History:  Procedure Laterality Date   FOOT SURGERY  1985   right foot   HERNIA REPAIR     s   TONSILLECTOMY     73-43 years old   Allergies  Allergen Reactions   Nitrofurantoin Monohyd Macro     Lip swelling   Wasp Venom Hives   Prior to Admission medications   Medication Sig Start Date End Date Taking? Authorizing Provider  amLODipine (NORVASC) 2.5 MG tablet Take 1 tablet (2.5 mg total)  by mouth daily. 02/24/22  Yes Wendie Agreste, MD  Calcium-Vitamin D (CALTRATE 600 PLUS-VIT D PO) Take by mouth.   Yes [provider]  Cholecalciferol (VITAMIN D-1000 MAX ST) 25 MCG (1000 UT) tablet Take by mouth.   Yes [provider]  EPINEPHrine (AUVI-Q) 0.3 mg/0.3 mL IJ SOAJ injection Inject 0.3 mg into the muscle as needed for anaphylaxis. 02/17/21  Yes Garnet Sierras, DO  estradiol (ESTRACE) 0.1 MG/GM vaginal  cream Insert vaginally twice weekly 08/05/21  Yes Juleen China, Tiffany A, NP  estradiol (ESTRACE) 0.1 MG/GM vaginal cream Place vaginally.   Yes [provider]  FLUoxetine (PROZAC) 20 MG capsule Take 1 capsule (20 mg total) by mouth daily. 12/02/21  Yes Wendie Agreste, MD  Iron-Vitamin C (IRON 100/C PO) Take by mouth.   Yes [provider]  Multiple Vitamin (MULTIVITAMIN) tablet Take 1 tablet by mouth daily.   Yes [provider]  VITAMIN D PO Take by mouth.   Yes [provider]  Biotin 1 MG CAPS Take by mouth. Patient not taking: Reported on 02/24/2022    [provider]  hydrOXYzine (ATARAX) 25 MG tablet Take 0.5-1 tablets (12.5-25 mg total) by mouth at bedtime as needed for anxiety. Patient not taking: Reported on 02/24/2022 06/03/21   Wendie Agreste, MD   Social History   Socioeconomic History   Marital status: Married    Spouse name: Not on file   Number of children: Not on file   Years of education: Not on file   Highest education level: Not on file  Occupational History   Not on file  Tobacco Use   Smoking status: Never   Smokeless tobacco: Never  Vaping Use   Vaping Use: Never used  Substance and Sexual Activity   Alcohol use: Yes    Alcohol/week: 4.0 standard drinks of alcohol    Types: 4 Glasses of wine per week   Drug use: No   Sexual activity: Yes    Birth control/protection: Post-menopausal    Comment: INTERCOUSRE AGE 53, SEXUAL PARTNERS LESS THAN 5  Other Topics Concern   Not on file  Social History Narrative   Not on file   Social Determinants of Health   Financial Resource Strain: Not on file  Food Insecurity: Not on file  Transportation Needs: Not on file  Physical Activity: Not on file  Stress: Not on file  Social Connections: Not on file  Intimate Partner Violence: Not on file    Observations/Objective: Vitals:   04/13/22 1110  BP: 132/71   Nontoxic-appearing on video.  Speaking in full sentences  without respiratory distress, wheezing or stridor.  Appropriate responses, euthymic mood.  All questions answered with understanding plan expressed  Assessment and Plan: Essential hypertension Improved control, tolerating current regimen, continue same with home monitoring, RTC precautions, option of higher dose amlodipine if persistent elevations.  Hyponatremia - Plan: Basic metabolic panel, CANCELED: Basic metabolic panel Anticipate improvement with liberalization of salt in the diet, less water.  Lab only visit next 1 week  Follow Up Instructions: Next 1 week for lab visit, 3 months in person with me for med follow-up.  Patient Instructions  Keep a record of your blood pressures outside of the office once per week or so and if over 130/80 let me know. No change in amlodipine for now.  Lab visit in next 1 week.  3 month follow up with me.   Hope you feel better soon. Sounds like a  virus at this time.  If you do take a covid test and positive, we are happy to discuss treatment - virtual visit or in office if needed.   Take care!    I discussed the assessment and treatment plan with the patient. The patient was provided an opportunity to ask questions and all were answered. The patient agreed with the plan and demonstrated an understanding of the instructions.   The patient was advised to call back or seek an in-person evaluation if the symptoms worsen or if the condition fails to improve as anticipated.   Wendie Agreste, MD

## 2022-04-13 NOTE — Progress Notes (Signed)
LM to call back and schedule the 3 month follow up

## 2022-04-13 NOTE — Patient Instructions (Addendum)
Keep a record of your blood pressures outside of the office once per week or so and if over 130/80 let me know. No change in amlodipine for now.  Lab visit in next 1 week.  3 month follow up with me.   Hope you feel better soon. Sounds like a virus at this time.  If you do take a covid test and positive, we are happy to discuss treatment - virtual visit or in office if needed.   Take care!

## 2022-04-14 ENCOUNTER — Other Ambulatory Visit (INDEPENDENT_AMBULATORY_CARE_PROVIDER_SITE_OTHER): Payer: BC Managed Care – PPO

## 2022-04-14 DIAGNOSIS — E871 Hypo-osmolality and hyponatremia: Secondary | ICD-10-CM

## 2022-04-14 LAB — BASIC METABOLIC PANEL
BUN: 18 mg/dL (ref 6–23)
CO2: 28 mEq/L (ref 19–32)
Calcium: 9.5 mg/dL (ref 8.4–10.5)
Chloride: 97 mEq/L (ref 96–112)
Creatinine, Ser: 0.84 mg/dL (ref 0.40–1.20)
GFR: 75.08 mL/min (ref >60.00)
Glucose, Bld: 84 mg/dL (ref 70–99)
Potassium: 4.2 mEq/L (ref 3.5–5.1)
Sodium: 132 mEq/L — ABNORMAL LOW (ref 135–145)

## 2022-05-12 NOTE — Progress Notes (Signed)
This encounter was created in error - please disregard.

## 2022-05-15 ENCOUNTER — Other Ambulatory Visit: Payer: Self-pay | Admitting: Family Medicine

## 2022-05-15 DIAGNOSIS — I10 Essential (primary) hypertension: Secondary | ICD-10-CM

## 2022-07-21 ENCOUNTER — Encounter: Payer: Self-pay | Admitting: Family Medicine

## 2022-07-21 ENCOUNTER — Other Ambulatory Visit: Payer: Self-pay | Admitting: Family Medicine

## 2022-07-21 ENCOUNTER — Ambulatory Visit: Payer: BC Managed Care – PPO | Admitting: Family Medicine

## 2022-07-21 VITALS — BP 128/74 | HR 64 | Temp 98.3°F | Ht 67.0 in | Wt 134.0 lb

## 2022-07-21 DIAGNOSIS — G4700 Insomnia, unspecified: Secondary | ICD-10-CM

## 2022-07-21 DIAGNOSIS — E871 Hypo-osmolality and hyponatremia: Secondary | ICD-10-CM | POA: Diagnosis not present

## 2022-07-21 DIAGNOSIS — I10 Essential (primary) hypertension: Secondary | ICD-10-CM | POA: Diagnosis not present

## 2022-07-21 DIAGNOSIS — F411 Generalized anxiety disorder: Secondary | ICD-10-CM

## 2022-07-21 LAB — BASIC METABOLIC PANEL
BUN: 14 mg/dL (ref 6–23)
CO2: 27 mEq/L (ref 19–32)
Calcium: 9.4 mg/dL (ref 8.4–10.5)
Chloride: 98 mEq/L (ref 96–112)
Creatinine, Ser: 0.78 mg/dL (ref 0.40–1.20)
GFR: 81.91 mL/min (ref >60.00)
Glucose, Bld: 91 mg/dL (ref 70–99)
Potassium: 4.8 mEq/L (ref 3.5–5.1)
Sodium: 132 mEq/L — ABNORMAL LOW (ref 135–145)

## 2022-07-21 MED ORDER — HYDROXYZINE HCL 25 MG PO TABS: 12.5000 mg | ORAL_TABLET | Freq: Every evening | ORAL | 1 refills | Status: DC | PRN

## 2022-07-21 MED ORDER — AMLODIPINE BESYLATE 2.5 MG PO TABS: 2.5000 mg | ORAL_TABLET | Freq: Every day | ORAL | 3 refills | Status: DC

## 2022-07-21 MED ORDER — FLUOXETINE HCL 20 MG PO CAPS: 20.0000 mg | ORAL_CAPSULE | Freq: Every day | ORAL | 3 refills | Status: DC
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
## 2022-07-21 NOTE — Patient Instructions (Signed)
Blood pressures look good.  No change in amlodipine for now.  If any concerns on labs I will let you know.  Same dose of fluoxetine for now with hydroxyzine if needed, but please follow-up if any worsening of symptoms.  Take care!

## 2022-08-31 ENCOUNTER — Other Ambulatory Visit (HOSPITAL_COMMUNITY)
Admission: RE | Admit: 2022-08-31 | Discharge: 2022-08-31 | Disposition: A | Discharge status: Home / Self Care | Payer: BC Managed Care – PPO | Source: Ambulatory Visit | Attending: Nurse Practitioner | Admitting: Nurse Practitioner

## 2022-08-31 ENCOUNTER — Encounter: Payer: Self-pay | Admitting: Nurse Practitioner

## 2022-08-31 ENCOUNTER — Ambulatory Visit (INDEPENDENT_AMBULATORY_CARE_PROVIDER_SITE_OTHER): Payer: BC Managed Care – PPO | Admitting: Nurse Practitioner

## 2022-08-31 VITALS — BP 136/72 | Ht 66.25 in | Wt 135.0 lb

## 2022-08-31 DIAGNOSIS — Z124 Encounter for screening for malignant neoplasm of cervix: Secondary | ICD-10-CM | POA: Insufficient documentation

## 2022-08-31 DIAGNOSIS — Z780 Asymptomatic menopausal state: Secondary | ICD-10-CM

## 2022-08-31 DIAGNOSIS — M810 Age-related osteoporosis without current pathological fracture: Secondary | ICD-10-CM

## 2022-08-31 DIAGNOSIS — N951 Menopausal and female climacteric states: Secondary | ICD-10-CM

## 2022-08-31 DIAGNOSIS — Z01419 Encounter for gynecological examination (general) (routine) without abnormal findings: Secondary | ICD-10-CM | POA: Diagnosis not present

## 2022-08-31 MED ORDER — ESTRADIOL 0.1 MG/GM VA CREA: TOPICAL_CREAM | VAGINAL | 2 refills | Status: DC

## 2022-08-31 NOTE — Progress Notes (Signed)
Lindsey Sanford 01/22/1961 [id number]   History:  62 y.o. G3P3 presents for annual exam. Postmenopausal - no bleeding, using vaginal estrogen for dryness/dyspareunia. Normal pap and mammogram history. Osteoporosis of bilateral hips, declines treatment and is optimizing Vit D + calcium and exercise.    Gynecologic History Patient's last menstrual period was 09/11/2013.   Contraception: post menopausal status Sexually active: Yes  Health maintenance Last Pap: 01/24/2018. Results were: Normal neg HPV, 5-year repeat Last mammogram: 10/27/2021. Results were: Normal Last colonoscopy: 12/26/2017. Results were: Polyps, 5-year recall Last Dexa: 08/11/2021. Results were: T-score -2.6 (-2.7 in 2020)  Past medical history, past surgical history, family history and social history were all reviewed and documented in the EPIC chart. Married. Husband works for Consulting civil engineer housing at [employer]. Patient retired. 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   ROS:  A ROS was performed and pertinent positives and negatives are included.  Exam:  Vitals:   08/31/22 1120  BP: 136/72  Weight: 135 lb (61.2 kg)  Height: 5' 6.25" (1.683 m)    Body mass index is 21.63 kg/m.  General appearance:  Normal Thyroid:  Symmetrical, normal in size, without palpable masses or nodularity. Respiratory  Auscultation:  Clear without wheezing or rhonchi Cardiovascular  Auscultation:  Regular rate, without rubs, murmurs or gallops  Edema/varicosities:  Not grossly evident Abdominal  Soft,nontender, without masses, guarding or rebound.  Liver/spleen:  No organomegaly noted  Hernia:  None appreciated  Skin  Inspection:  Grossly normal   Breasts: Examined lying and sitting.   Right: Without masses, retractions, discharge or axillary adenopathy.   Left: Without masses, retractions, discharge or axillary  adenopathy. Genitourinary   Inguinal/mons:  Normal without inguinal adenopathy  External genitalia:  Normal appearing vulva with no masses, tenderness, or lesions  BUS/Urethra/Skene's glands:  Normal  Vagina:  Normal appearing with normal color and discharge, no lesions. Atrophic changes  Cervix:  Normal appearing without discharge or lesions  Uterus:  Normal in size, shape and contour.  Midline and mobile, nontender  Adnexa/parametria:     Rt: Normal in size, without masses or tenderness.   Lt: Normal in size, without masses or tenderness.  Anus and perineum: Normal  Digital rectal exam: Deferred  Patient informed chaperone available to be present for breast and pelvic exam. Patient has requested no chaperone to be present. Patient has been advised what will be completed during breast and pelvic exam.   Assessment/Plan:  62 y.o. G3P3 for annual exam.   Well female exam with routine gynecological exam - Education provided on SBEs, importance of preventative screenings, current guidelines, high calcium diet, regular exercise, and multivitamin daily.  Labs with PCP.   Menopausal vaginal dryness - Plan: estradiol (ESTRACE) 0.1 MG/GM vaginal cream twice weekly with good management.   Screening for cervical cancer - Plan: Cytology - PAP( [place]). Normal pap history.   Postmenopausal - no HRT, no bleeding  Age-related osteoporosis without current pathological fracture - T-score -2.7 in May 2023. Declines medication management and is optimizing Vit D + calcium and regular exercise. Will repeat DXA next year.   Screening for breast cancer - Normal mammogram history.  Continue annual screenings.  Normal breast exam today.  Screening for colon cancer - 2019 colonoscopy. Will repeat at 5-year interval per GI's recommendation.   Follow-up in 1 year for annual.       Lindsey Sanford [facility], 12:23 PM  08/31/2022 

## 2022-09-06 LAB — CYTOLOGY - PAP
Comment: NEGATIVE
Diagnosis: NEGATIVE
High risk HPV: NEGATIVE

## 2022-11-16 ENCOUNTER — Encounter: Payer: BC Managed Care – PPO | Admitting: Family Medicine

## 2022-11-22 ENCOUNTER — Other Ambulatory Visit: Payer: Self-pay | Admitting: Family Medicine

## 2022-11-22 DIAGNOSIS — Z1231 Encounter for screening mammogram for malignant neoplasm of breast: Secondary | ICD-10-CM

## 2022-12-14 ENCOUNTER — Ambulatory Visit: Admission: RE | Admit: 2022-12-14 | Payer: BC Managed Care – PPO | Source: Ambulatory Visit

## 2022-12-14 DIAGNOSIS — Z1231 Encounter for screening mammogram for malignant neoplasm of breast: Secondary | ICD-10-CM

## 2022-12-27 ENCOUNTER — Encounter: Payer: Self-pay | Admitting: Gastroenterology

## 2023-02-01 ENCOUNTER — Encounter: Payer: Self-pay | Admitting: Family Medicine

## 2023-02-01 ENCOUNTER — Ambulatory Visit: Payer: BC Managed Care – PPO | Admitting: Family Medicine

## 2023-02-01 VITALS — BP 136/70 | HR 74 | Temp 97.9°F | Ht 66.0 in | Wt 138.8 lb

## 2023-02-01 DIAGNOSIS — Z13220 Encounter for screening for lipoid disorders: Secondary | ICD-10-CM

## 2023-02-01 DIAGNOSIS — Z0000 Encounter for general adult medical examination without abnormal findings: Secondary | ICD-10-CM

## 2023-02-01 DIAGNOSIS — Z131 Encounter for screening for diabetes mellitus: Secondary | ICD-10-CM | POA: Diagnosis not present

## 2023-02-01 DIAGNOSIS — Z1329 Encounter for screening for other suspected endocrine disorder: Secondary | ICD-10-CM

## 2023-02-01 DIAGNOSIS — F411 Generalized anxiety disorder: Secondary | ICD-10-CM

## 2023-02-01 DIAGNOSIS — G4700 Insomnia, unspecified: Secondary | ICD-10-CM | POA: Diagnosis not present

## 2023-02-01 DIAGNOSIS — I10 Essential (primary) hypertension: Secondary | ICD-10-CM | POA: Diagnosis not present

## 2023-02-01 LAB — HEMOGLOBIN A1C: Hgb A1c MFr Bld: 5.6 % (ref 4.6–6.5)

## 2023-02-01 LAB — COMPREHENSIVE METABOLIC PANEL
ALT: 15 U/L (ref 0–35)
AST: 21 U/L (ref 0–37)
Albumin: 4.2 g/dL (ref 3.5–5.2)
Alkaline Phosphatase: 75 U/L (ref 39–117)
BUN: 16 mg/dL (ref 6–23)
CO2: 26 meq/L (ref 19–32)
Calcium: 9.4 mg/dL (ref 8.4–10.5)
Chloride: 99 meq/L (ref 96–112)
Creatinine, Ser: 0.83 mg/dL (ref 0.40–1.20)
GFR: 75.74 mL/min (ref >60.00)
Glucose, Bld: 91 mg/dL (ref 70–99)
Potassium: 4.8 meq/L (ref 3.5–5.1)
Sodium: 129 meq/L — ABNORMAL LOW (ref 135–145)
Total Bilirubin: 0.3 mg/dL (ref 0.2–1.2)
Total Protein: 7.2 g/dL (ref 6.0–8.3)

## 2023-02-01 LAB — CBC WITH DIFFERENTIAL/PLATELET
Basophils Absolute: 0.1 10*3/uL (ref 0.0–0.1)
Basophils Relative: 0.7 % (ref 0.0–3.0)
Eosinophils Absolute: 0.3 10*3/uL (ref 0.0–0.7)
Eosinophils Relative: 4 % (ref 0.0–5.0)
HCT: 37.8 % (ref 36.0–46.0)
Hemoglobin: 12.3 g/dL (ref 12.0–15.0)
Lymphocytes Relative: 25.4 % (ref 12.0–46.0)
Lymphs Abs: 1.8 10*3/uL (ref 0.7–4.0)
MCHC: 32.5 g/dL (ref 30.0–36.0)
MCV: 90.5 fL (ref 78.0–100.0)
Monocytes Absolute: 0.7 10*3/uL (ref 0.1–1.0)
Monocytes Relative: 9.3 % (ref 3.0–12.0)
Neutro Abs: 4.4 10*3/uL (ref 1.4–7.7)
Neutrophils Relative %: 60.6 % (ref 43.0–77.0)
Platelets: 325 10*3/uL (ref 150.0–400.0)
RBC: 4.18 Mil/uL (ref 3.87–5.11)
RDW: 14.2 % (ref 11.5–15.5)
WBC: 7.3 10*3/uL (ref 4.0–10.5)

## 2023-02-01 LAB — LIPID PANEL
Cholesterol: 224 mg/dL — ABNORMAL HIGH (ref 0–200)
HDL: 84.3 mg/dL (ref >39.00)
LDL Cholesterol: 126 mg/dL — ABNORMAL HIGH (ref 0–99)
NonHDL: 139.71
Total CHOL/HDL Ratio: 3
Triglycerides: 70 mg/dL (ref 0.0–149.0)
VLDL: 14 mg/dL (ref 0.0–40.0)

## 2023-02-01 LAB — TSH: TSH: 1.27 u[IU]/mL (ref 0.35–5.50)

## 2023-02-01 MED ORDER — HYDROXYZINE HCL 25 MG PO TABS: 12.5000 mg | ORAL_TABLET | Freq: Every evening | ORAL | 1 refills | Status: AC | PRN

## 2023-02-01 NOTE — Progress Notes (Signed)
Subjective:  Patient ID: Lindsey Sanford, female    DOB: November 07, 1960  Age: 62 y.o. MRN: [id number]  CC:  Chief Complaint  Patient presents with   Annual Exam    HPI Lindsey Sanford presents for Annual Exam PCP, [facility], Wyline Beady NP, exam in May.  Negative Pap with negative high-risk HPV on May 22.   No health changes since last visit.  Hypertension: Treated with low-dose amlodipine 2.5 mg daily.  Borderline renal function, hyponatremia previously.  Salt intake liberalization discussed.  Asymptomatic hyponatremia previously. Still using some salt in food and stable water intake. At least one 22 oz water bottle and water at dinner.  Home readings: 116-133/63-72 BP Readings from Last 3 Encounters:  02/01/23 136/70  08/31/22 136/72  07/21/22 128/74   Lab Results  Component Value Date   CREATININE 0.78 07/21/2022   Lab Results  Component Value Date   NA 132 (L) 07/21/2022   K 4.8 07/21/2022   CL 98 07/21/2022   CO2 27 07/21/2022   Anxiety Situational anxiety discussed at her visit in April.  Typically better in the summer.  Still taking Prozac 20 mg daily without any side effects, hydroxyzine if needed. 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  mother turned 30 - doing well.  Feels like current med doses stable. No recent need for hydroxyzine. Less physical symptoms of anxiety with BP control.     02/01/2023   10:33 AM 07/21/2022   10:44 AM 06/03/2021   10:53 AM 11/26/2020   10:53 AM  GAD 7 : Generalized Anxiety Score  Nervous, Anxious, on Edge 1 1 1 1   Control/stop worrying 0 0 0 1  Worry too much - different things 1 0 0 0  Trouble relaxing 0 1 1 2   Restless 1 0 1 2  Easily annoyed or irritable 1 0 1 1  Afraid - awful might happen 0 0 0 1  Total GAD 7 Score 4 2 4 8   Anxiety Difficulty  Not difficult at all            02/01/2023   10:33 AM 07/21/2022   10:43 AM 02/24/2022   11:22 AM 12/02/2021    1:53 PM  07/22/2021    9:16 AM  Depression screen PHQ 2/9  Decreased Interest 2 0 1 0 0  Down, Depressed, Hopeless 2 0 0 0 0  PHQ - 2 Score 4 0 1 0 0  Altered sleeping 0 1 1 1 1   Tired, decreased energy 2 0 1 1 1   Change in appetite 1 0 0 0 0  Feeling bad or failure about yourself  1 1 0 0 0  Trouble concentrating 0 0 0 0 0  Moving slowly or fidgety/restless 0 0 0 0 0  Suicidal thoughts 0 0 0 0 0  PHQ-9 Score 8 2 3 2 2   Difficult doing work/chores Not difficult at all Not difficult at all       Health Maintenance  Topic Date Due   HIV Screening  Never done   Hepatitis C Screening  Never done   Zoster Vaccines- Shingrix (1 of 2) Never done   INFLUENZA VACCINE  11/10/2022   Colonoscopy  12/27/2022   COVID-19 Vaccine (8 - 2023-24 season) 02/14/2023   Cervical Cancer Screening (Pap smear)  08/31/2023   MAMMOGRAM  12/14/2023   DTaP/Tdap/Td (2 - Td or Tdap) 11/26/2026   HPV VACCINES  Aged Out  Colonoscopy 12/26/2017, 5-year follow-up, Dr. Meridee Score - has letter - plans to schedule.  Pap test earlier this year as above. Mammogram 12/14/2022.  Repeat 1 year.  Immunization History  Administered Date(s) Administered   Influenza,inj,Quad PF,6+ Mos 01/23/2019, 01/14/2020, 01/21/2021, 01/10/2022   Moderna Sars-Covid-2 Vaccination 04/09/2019, 05/07/2019   PFIZER(Purple Top)SARS-COV-2 Vaccination 02/13/2020, 11/11/2020, 01/07/2022   Pfizer(Comirnaty)Fall Seasonal Vaccine 12 years and older 12/20/2022   Tdap 11/25/2016  Flu vaccine 2 days ago at [facility].  RSV vaccine-  Declines shingrix for now. Plans on scheduling.   No results found. Wears glasses - optho - in past few months.   Dental: every 6 months.   Alcohol: rare- less than one per week.   Tobacco: none  Exercise: walking most days, 20-5min. Some strength training at times.    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   Primary osteoarthritis of both hands 02/22/2016   Primary osteoarthritis of both feet 02/22/2016   Fatigue 02/22/2016   Osteochondroma of foot, right 02/22/2016   Anxiety 07/15/2011   Past Medical History:  Diagnosis Date   Anxiety    Depression    Hypertension    Mouth ulcers    most of your life/ due to stress   Osteopenia    Osteoporosis    Past Surgical History:  Procedure Laterality Date   FOOT SURGERY  1985   right foot   HERNIA REPAIR     s   TONSILLECTOMY     46-73 years old   Allergies  Allergen Reactions   Nitrofurantoin Monohyd Macro     Lip swelling   Wasp Venom Hives   Prior to Admission medications   Medication Sig Start Date End Date Taking? Authorizing Provider  amLODipine (NORVASC) 2.5 MG tablet Take 1 tablet (2.5 mg total) by mouth daily. 07/21/22  Yes Shade Flood, MD  Cholecalciferol (VITAMIN D-1000 MAX ST) 25 MCG (1000 UT) tablet Take by mouth.   Yes [provider]  EPINEPHrine (AUVI-Q) 0.3 mg/0.3 mL IJ SOAJ injection Inject 0.3 mg into the muscle as needed for anaphylaxis. 02/17/21  Yes Ellamae Sia, DO  estradiol (ESTRACE) 0.1 MG/GM vaginal cream Insert vaginally twice weekly 08/31/22  Yes Earlene Plater, Tiffany A, NP  FLUoxetine (PROZAC) 20 MG capsule Take 1 capsule (20 mg total) by mouth daily. 07/21/22  Yes Shade Flood, MD  hydrOXYzine (ATARAX) 25 MG tablet Take 0.5-1 tablets (12.5-25 mg total) by mouth at bedtime as needed for anxiety. 07/21/22  Yes Shade Flood, MD  Iron-Vitamin C (IRON 100/C PO) Take by mouth.   Yes [provider]  Multiple Vitamin (MULTIVITAMIN) tablet Take 1 tablet by mouth daily.   Yes [provider]   Social History   Socioeconomic History   Marital status: Married    Spouse name: Not on file   Number of children: Not on file   Years of education: Not on file   Highest  education level: Not on file  Occupational History   Not on file  Tobacco Use   Smoking status: Never    Passive exposure: Past   Smokeless tobacco: Never  Vaping Use   Vaping status: Never Used  Substance and Sexual Activity   Alcohol use: Yes    Alcohol/week: 2.0 standard drinks of  alcohol    Types: 2 Glasses of wine per week   Drug use: No   Sexual activity: Yes    Partners: Male    Birth control/protection: Post-menopausal    Comment: INTERCOUSRE AGE 5, SEXUAL PARTNERS LESS THAN 5  Other Topics Concern   Not on file  Social History Narrative   Not on file   Social Determinants of Health   Financial Resource Strain: Not on file  Food Insecurity: Not on file  Transportation Needs: Not on file  Physical Activity: Not on file  Stress: Not on file  Social Connections: Not on file  Intimate Partner Violence: Not on file    Review of Systems  13 point review of systems per patient health survey noted.  Negative other than as indicated above or in HPI.   Objective:   Vitals:   02/01/23 1028  BP: 136/70  Pulse: 74  Temp: 97.9 F (36.6 C)  TempSrc: Temporal  SpO2: 99%  Weight: 138 lb 12.8 oz (63 kg)  Height: 5\' 6"  (1.676 m)     Physical Exam Constitutional:      Appearance: She is well-developed.  HENT:     Head: Normocephalic and atraumatic.     Right Ear: External ear normal.     Left Ear: External ear normal.  Eyes:     Conjunctiva/sclera: Conjunctivae normal.     Pupils: Pupils are equal, round, and reactive to light.  Neck:     Thyroid: No thyromegaly.  Cardiovascular:     Rate and Rhythm: Normal rate and regular rhythm.     Heart sounds: Normal heart sounds. No murmur heard. Pulmonary:     Effort: Pulmonary effort is normal. No respiratory distress.     Breath sounds: Normal breath sounds. No wheezing.  Abdominal:     General: Bowel sounds are normal.     Palpations: Abdomen is soft.     Tenderness: There is no abdominal tenderness.   Musculoskeletal:        General: No tenderness. Normal range of motion.     Cervical back: Normal range of motion and neck supple.  Lymphadenopathy:     Cervical: No cervical adenopathy.  Skin:    General: Skin is warm and dry.     Findings: No rash.  Neurological:     Mental Status: She is alert and oriented to person, place, and time.  Psychiatric:        Behavior: Behavior normal.        Thought Content: Thought content normal.        Assessment & Plan:  Lindsey Sanford is a 62 y.o. female . Annual physical exam - Plan: hydrOXYzine (ATARAX) 25 MG tablet, Hemoglobin A1c, CBC with Differential/Platelet, Comprehensive metabolic panel, Lipid panel, TSH  - -anticipatory guidance as below in AVS, screening labs above. Health maintenance items as above in HPI discussed/recommended as applicable.   Generalized anxiety disorder - Plan: hydrOXYzine (ATARAX) 25 MG tablet Insomnia, unspecified type - Plan: hydrOXYzine (ATARAX) 25 MG tablet  -Stable with current dose of fluoxetine, ready for hydroxyzine, refilled.  Continue same med regimen.  Continue coping techniques, exercise, RTC precautions, 43-month follow-up.  Screening for thyroid disorder - Plan: TSH  Essential hypertension - Plan: CBC with Differential/Platelet  -Stable, continue same dose amlodipine, prior hyponatremia, check labs and adjust plan accordingly.  Screening for hyperlipidemia - Plan: Comprehensive metabolic panel, Lipid panel  Screening for diabetes mellitus - Plan: Hemoglobin A1c, Comprehensive metabolic panel   Meds  ordered this encounter  Medications   hydrOXYzine (ATARAX) 25 MG tablet    Sig: Take 0.5-1 tablets (12.5-25 mg total) by mouth at bedtime as needed for anxiety.    Dispense:  90 tablet    Refill:  1   Patient Instructions  Blood pressure looks ok. No changes.  Call to schedule a nurse visit for shingles vaccine when ready. Schedule colonoscopy.  RSV vaccine is optional: There is a  recommendation for RSV vaccine for patients over age 81.   Typically would recommend the RSV vaccine as most important for patients over age 37 that have comorbidities that put them at increased risk for severe disease (heart disease such as congestive heart failure, coronary artery disease, lung disease such as asthma or COPD, kidney disease, liver disease, diabetes, chronic or progressive neurologic or muscular conditions, immunosuppressed, or being frail or of advanced age).  For others that do not have these risk factors, there still is some benefit from vaccination since age is one of the main risk factors for developing severe disease however baseline risk of developing severe disease and requiring hospitalization is likely to be lower compared to those that have comorbidities in addition to age.   CDC does have some information as well: ToyProtection.fi  If any concerns on labs I will let you know, take care!  Preventive Care 88-30 Years Old, Female Preventive care refers to lifestyle choices and visits with your health care provider that can promote health and wellness. Preventive care visits are also called wellness exams. What can I expect for my preventive care visit? Counseling Your health care provider may ask you questions about your: Medical history, including: Past medical problems. Family medical history. Pregnancy history. Current health, including: Menstrual cycle. Method of birth control. Emotional well-being. Home life and relationship well-being. Sexual activity and sexual health. Lifestyle, including: Alcohol, nicotine or tobacco, and drug use. Access to firearms. Diet, exercise, and sleep habits. Work and work Astronomer. Sunscreen use. Safety issues such as seatbelt and bike helmet use. Physical exam Your health care provider will check your: Height and weight. These may be used to calculate your BMI (body mass  index). BMI is a measurement that tells if you are at a healthy weight. Waist circumference. This measures the distance around your waistline. This measurement also tells if you are at a healthy weight and may help predict your risk of certain diseases, such as type 2 diabetes and high blood pressure. Heart rate and blood pressure. Body temperature. Skin for abnormal spots. What immunizations do I need?  Vaccines are usually given at various ages, according to a schedule. Your health care provider will recommend vaccines for you based on your age, medical history, and lifestyle or other factors, such as travel or where you work. What tests do I need? Screening Your health care provider may recommend screening tests for certain conditions. This may include: Lipid and cholesterol levels. Diabetes screening. This is done by checking your blood sugar (glucose) after you have not eaten for a while (fasting). Pelvic exam and Pap test. Hepatitis B test. Hepatitis C test. HIV (human immunodeficiency virus) test. STI (sexually transmitted infection) testing, if you are at risk. Lung cancer screening. Colorectal cancer screening. Mammogram. Talk with your health care provider about when you should start having regular mammograms. This may depend on whether you have a family history of breast cancer. BRCA-related cancer screening. This may be done if you have a family history of breast, ovarian, tubal, or peritoneal  cancers. Bone density scan. This is done to screen for osteoporosis. Talk with your health care provider about your test results, treatment options, and if necessary, the need for more tests. Follow these instructions at home: Eating and drinking  Eat a diet that includes fresh fruits and vegetables, whole grains, lean protein, and low-fat dairy products. Take vitamin and mineral supplements as recommended by your health care provider. Do not drink alcohol if: Your health care provider  tells you not to drink. You are pregnant, may be pregnant, or are planning to become pregnant. If you drink alcohol: Limit how much you have to 0-1 drink a day. Know how much alcohol is in your drink. In the [place] one drink equals one 12 oz bottle of beer (355 mL), one 5 oz glass of wine (148 mL), or one 1 oz glass of hard liquor (44 mL). Lifestyle Brush your teeth every morning and night with fluoride toothpaste. Floss one time each day. Exercise for at least 30 minutes 5 or more days each week. Do not use any products that contain nicotine or tobacco. These products include cigarettes, chewing tobacco, and vaping devices, such as e-cigarettes. If you need help quitting, ask your health care provider. Do not use drugs. If you are sexually active, practice safe sex. Use a condom or other form of protection to prevent STIs. If you do not wish to become pregnant, use a form of birth control. If you plan to become pregnant, see your health care provider for a prepregnancy visit. Take aspirin only as told by your health care provider. Make sure that you understand how much to take and what form to take. Work with your health care provider to find out whether it is safe and beneficial for you to take aspirin daily. Find healthy ways to manage stress, such as: Meditation, yoga, or listening to music. Journaling. Talking to a trusted person. Spending time with friends and family. Minimize exposure to UV radiation to reduce your risk of skin cancer. Safety Always wear your seat belt while driving or riding in a vehicle. Do not drive: If you have been drinking alcohol. Do not ride with someone who has been drinking. When you are tired or distracted. While texting. If you have been using any mind-altering substances or drugs. Wear a helmet and other protective equipment during sports activities. If you have firearms in your house, make sure you follow all gun safety procedures. Seek help if you  have been physically or sexually abused. What's next? Visit your health care provider once a year for an annual wellness visit. Ask your health care provider how often you should have your eyes and teeth checked. Stay up to date on all vaccines. This information is not intended to replace advice given to you by your health care provider. Make sure you discuss any questions you have with your health care provider. Document Revised: 09/23/2020 Document Reviewed: 09/23/2020 Elsevier Patient Education  2024 Elsevier Inc.     Signed,   Meredith Staggers, MD [facility], [facility] 02/01/23 11:09 AM

## 2023-02-01 NOTE — Patient Instructions (Addendum)
Blood pressure looks ok. No changes.  Call to schedule a nurse visit for shingles vaccine when ready. Schedule colonoscopy.  RSV vaccine is optional: There is a recommendation for RSV vaccine for patients over age 62.   Typically would recommend the RSV vaccine as most important for patients over age 57 that have comorbidities that put them at increased risk for severe disease (heart disease such as congestive heart failure, coronary artery disease, lung disease such as asthma or COPD, kidney disease, liver disease, diabetes, chronic or progressive neurologic or muscular conditions, immunosuppressed, or being frail or of advanced age).  For others that do not have these risk factors, there still is some benefit from vaccination since age is one of the main risk factors for developing severe disease however baseline risk of developing severe disease and requiring hospitalization is likely to be lower compared to those that have comorbidities in addition to age.   CDC does have some information as well: ToyProtection.fi  If any concerns on labs I will let you know, take care!  Preventive Care 84-90 Years Old, Female Preventive care refers to lifestyle choices and visits with your health care provider that can promote health and wellness. Preventive care visits are also called wellness exams. What can I expect for my preventive care visit? Counseling Your health care provider may ask you questions about your: Medical history, including: Past medical problems. Family medical history. Pregnancy history. Current health, including: Menstrual cycle. Method of birth control. Emotional well-being. Home life and relationship well-being. Sexual activity and sexual health. Lifestyle, including: Alcohol, nicotine or tobacco, and drug use. Access to firearms. Diet, exercise, and sleep habits. Work and work Astronomer. Sunscreen use. Safety issues such as  seatbelt and bike helmet use. Physical exam Your health care provider will check your: Height and weight. These may be used to calculate your BMI (body mass index). BMI is a measurement that tells if you are at a healthy weight. Waist circumference. This measures the distance around your waistline. This measurement also tells if you are at a healthy weight and may help predict your risk of certain diseases, such as type 2 diabetes and high blood pressure. Heart rate and blood pressure. Body temperature. Skin for abnormal spots. What immunizations do I need?  Vaccines are usually given at various ages, according to a schedule. Your health care provider will recommend vaccines for you based on your age, medical history, and lifestyle or other factors, such as travel or where you work. What tests do I need? Screening Your health care provider may recommend screening tests for certain conditions. This may include: Lipid and cholesterol levels. Diabetes screening. This is done by checking your blood sugar (glucose) after you have not eaten for a while (fasting). Pelvic exam and Pap test. Hepatitis B test. Hepatitis C test. HIV (human immunodeficiency virus) test. STI (sexually transmitted infection) testing, if you are at risk. Lung cancer screening. Colorectal cancer screening. Mammogram. Talk with your health care provider about when you should start having regular mammograms. This may depend on whether you have a family history of breast cancer. BRCA-related cancer screening. This may be done if you have a family history of breast, ovarian, tubal, or peritoneal cancers. Bone density scan. This is done to screen for osteoporosis. Talk with your health care provider about your test results, treatment options, and if necessary, the need for more tests. Follow these instructions at home: Eating and drinking  Eat a diet that includes fresh fruits and vegetables,  whole grains, lean protein, and  low-fat dairy products. Take vitamin and mineral supplements as recommended by your health care provider. Do not drink alcohol if: Your health care provider tells you not to drink. You are pregnant, may be pregnant, or are planning to become pregnant. If you drink alcohol: Limit how much you have to 0-1 drink a day. Know how much alcohol is in your drink. In the [place] one drink equals one 12 oz bottle of beer (355 mL), one 5 oz glass of wine (148 mL), or one 1 oz glass of hard liquor (44 mL). Lifestyle Brush your teeth every morning and night with fluoride toothpaste. Floss one time each day. Exercise for at least 30 minutes 5 or more days each week. Do not use any products that contain nicotine or tobacco. These products include cigarettes, chewing tobacco, and vaping devices, such as e-cigarettes. If you need help quitting, ask your health care provider. Do not use drugs. If you are sexually active, practice safe sex. Use a condom or other form of protection to prevent STIs. If you do not wish to become pregnant, use a form of birth control. If you plan to become pregnant, see your health care provider for a prepregnancy visit. Take aspirin only as told by your health care provider. Make sure that you understand how much to take and what form to take. Work with your health care provider to find out whether it is safe and beneficial for you to take aspirin daily. Find healthy ways to manage stress, such as: Meditation, yoga, or listening to music. Journaling. Talking to a trusted person. Spending time with friends and family. Minimize exposure to UV radiation to reduce your risk of skin cancer. Safety Always wear your seat belt while driving or riding in a vehicle. Do not drive: If you have been drinking alcohol. Do not ride with someone who has been drinking. When you are tired or distracted. While texting. If you have been using any mind-altering substances or drugs. Wear a helmet  and other protective equipment during sports activities. If you have firearms in your house, make sure you follow all gun safety procedures. Seek help if you have been physically or sexually abused. What's next? Visit your health care provider once a year for an annual wellness visit. Ask your health care provider how often you should have your eyes and teeth checked. Stay up to date on all vaccines. This information is not intended to replace advice given to you by your health care provider. Make sure you discuss any questions you have with your health care provider. Document Revised: 09/23/2020 Document Reviewed: 09/23/2020 Elsevier Patient Education  2024 [employer].

## 2023-02-21 ENCOUNTER — Encounter: Payer: Self-pay | Admitting: Gastroenterology

## 2023-03-08 ENCOUNTER — Ambulatory Visit: Payer: BC Managed Care – PPO | Admitting: Family Medicine

## 2023-03-08 ENCOUNTER — Encounter: Payer: Self-pay | Admitting: Family Medicine

## 2023-03-08 VITALS — BP 130/68 | HR 75 | Temp 97.4°F | Ht 66.0 in | Wt 140.0 lb

## 2023-03-08 DIAGNOSIS — J069 Acute upper respiratory infection, unspecified: Secondary | ICD-10-CM | POA: Diagnosis not present

## 2023-03-08 DIAGNOSIS — E871 Hypo-osmolality and hyponatremia: Secondary | ICD-10-CM | POA: Diagnosis not present

## 2023-03-08 LAB — BASIC METABOLIC PANEL
BUN: 16 mg/dL (ref 6–23)
CO2: 28 meq/L (ref 19–32)
Calcium: 9.3 mg/dL (ref 8.4–10.5)
Chloride: 97 meq/L (ref 96–112)
Creatinine, Ser: 0.88 mg/dL (ref 0.40–1.20)
GFR: 70.56 mL/min (ref >60.00)
Glucose, Bld: 88 mg/dL (ref 70–99)
Potassium: 4.4 meq/L (ref 3.5–5.1)
Sodium: 130 meq/L — ABNORMAL LOW (ref 135–145)

## 2023-03-08 NOTE — Patient Instructions (Addendum)
Thanks for coming in today.  I will recheck the sodium level on electrolytes today.  If still at the lower range, would consider trying something different than fluoxetine as that medicine can sometimes cause low sodium.  We would add a different medicine to help manage symptoms.  Let's wait on lab results before we make any changes and then can discuss plan further.  If any new symptoms, be seen as we discussed but I do not expect that to occur.  I do think you likely have a viral respiratory illness.  See information below.  Glad to hear that is improving. Return to the clinic or go to the nearest emergency room if any of your symptoms worsen or new symptoms occur.  Upper Respiratory Infection, Adult An upper respiratory infection (URI) is a common viral infection of the nose, throat, and upper air passages that lead to the lungs. The most common type of URI is the common cold. URIs usually get better on their own, without medical treatment. What are the causes? A URI is caused by a virus. You may catch a virus by: Breathing in droplets from an infected person's cough or sneeze. Touching something that has been exposed to the virus (is contaminated) and then touching your mouth, nose, or eyes. What increases the risk? You are more likely to get a URI if: You are very young or very old. You have close contact with others, such as at work, school, or a health care facility. You smoke. You have long-term (chronic) heart or lung disease. You have a weakened disease-fighting system (immune system). You have nasal allergies or asthma. You are experiencing a lot of stress. You have poor nutrition. What are the signs or symptoms? A URI usually involves some of the following symptoms: Runny or stuffy (congested) nose. Cough. Sneezing. Sore throat. Headache. Fatigue. Fever. Loss of appetite. Pain in your forehead, behind your eyes, and over your cheekbones (sinus pain). Muscle aches. Redness  or irritation of the eyes. Pressure in the ears or face. How is this diagnosed? This condition may be diagnosed based on your medical history and symptoms, and a physical exam. Your health care provider may use a swab to take a mucus sample from your nose (nasal swab). This sample can be tested to determine what virus is causing the illness. How is this treated? URIs usually get better on their own within 7-10 days. Medicines cannot cure URIs, but your health care provider may recommend certain medicines to help relieve symptoms, such as: Over-the-counter cold medicines. Cough suppressants. Coughing is a type of defense against infection that helps to clear the respiratory system, so take these medicines only as recommended by your health care provider. Fever-reducing medicines. Follow these instructions at home: Activity Rest as needed. If you have a fever, stay home from work or school until your fever is gone or until your health care provider says your URI cannot spread to other people (is no longer contagious). Your health care provider may have you wear a face mask to prevent your infection from spreading. Relieving symptoms Gargle with a mixture of salt and water 3-4 times a day or as needed. To make salt water, completely dissolve -1 tsp (3-6 g) of salt in 1 cup (237 mL) of warm water. Use a cool-mist humidifier to add moisture to the air. This can help you breathe more easily. Eating and drinking  Drink enough fluid to keep your urine pale yellow. Eat soups and other clear broths.  General instructions  Take over-the-counter and prescription medicines only as told by your health care provider. These include cold medicines, fever reducers, and cough suppressants. Do not use any products that contain nicotine or tobacco. These products include cigarettes, chewing tobacco, and vaping devices, such as e-cigarettes. If you need help quitting, ask your health care provider. Stay away from  secondhand smoke. Stay up to date on all immunizations, including the yearly (annual) flu vaccine. Keep all follow-up visits. This is important. How to prevent the spread of infection to others URIs can be contagious. To prevent the infection from spreading: Wash your hands with soap and water for at least 20 seconds. If soap and water are not available, use hand sanitizer. Avoid touching your mouth, face, eyes, or nose. Cough or sneeze into a tissue or your sleeve or elbow instead of into your hand or into the air.  Contact a health care provider if: You are getting worse instead of better. You have a fever or chills. Your mucus is brown or red. You have yellow or brown discharge coming from your nose. You have pain in your face, especially when you bend forward. You have swollen neck glands. You have pain while swallowing. You have white areas in the back of your throat. Get help right away if: You have shortness of breath that gets worse. You have severe or persistent: Headache. Ear pain. Sinus pain. Chest pain. You have chronic lung disease along with any of the following: Making high-pitched whistling sounds when you breathe, most often when you breathe out (wheezing). Prolonged cough (more than 14 days). Coughing up blood. A change in your usual mucus. You have a stiff neck. You have changes in your: Vision. Hearing. Thinking. Mood. These symptoms may be an emergency. Get help right away. Call 911. Do not wait to see if the symptoms will go away. Do not drive yourself to the hospital. Summary An upper respiratory infection (URI) is a common infection of the nose, throat, and upper air passages that lead to the lungs. A URI is caused by a virus. URIs usually get better on their own within 7-10 days. Medicines cannot cure URIs, but your health care provider may recommend certain medicines to help relieve symptoms. This information is not intended to replace advice  given to you by your health care provider. Make sure you discuss any questions you have with your health care provider. Document Revised: 10/28/2020 Document Reviewed: 10/28/2020 Elsevier Patient Education  2024 [employer].

## 2023-03-08 NOTE — Progress Notes (Signed)
Subjective:  Patient ID: Lindsey Sanford, female    DOB: September 17, 1960  Age: 62 y.o. MRN: [id number]  CC:  Chief Complaint  Patient presents with   Discuss Lab Results    Recent labs showed hyponatremia.    Respiratory Illness    Started about 3 days ago. Having sore throat, nasal congestion, ear pain. Feeling a little better today. Did a home covid test 2 days ago and it was negative.     HPI Lindsey Sanford presents for   Hyponatremia Last visit October 23.  Asymptomatic hyponatremia previously, salt intake liberalization discussed.  Was staying well-hydrated.  She takes fluoxetine 20 mg daily for anxiety.  Was not requiring hydroxyzine at last visit, on amlodipine 2.5 mg daily for hypertension. Prior level around 132, most recent test slightly lower at 129 on 02/01/2023. No HA, vision changes or dizziness typically. Few times has felt lightheaded - drank V8 and symptoms improved in few minutes. No unsteadiness/weakness. No n/v. Adding salt to food. No otc supplements other than MVI, vit D. No diuretics.  Lab Results  Component Value Date   NA 129 (L) 02/01/2023   K 4.8 02/01/2023   CL 99 02/01/2023   CO2 26 02/01/2023   Lab Results  Component Value Date   TSH 1.27 02/01/2023       Upper respiratory infection started 3 days ago with sore throat,  nasal congestion, ear pain.  No fever. COVID testing was negative few days ago, feeling better today. Eating and drinking ok.  Tx: nyquil, dayquil.   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   Primary osteoarthritis of both hands 02/22/2016   Primary osteoarthritis of both feet 02/22/2016   Fatigue 02/22/2016   Osteochondroma of foot, right 02/22/2016   Anxiety 07/15/2011   Past Medical History:   Diagnosis Date   Anxiety    Depression    Hypertension    Mouth ulcers    most of your life/ due to stress   Osteopenia    Osteoporosis    Past Surgical History:  Procedure Laterality Date   FOOT SURGERY  1985   right foot   HERNIA REPAIR     s   TONSILLECTOMY     22-42 years old   Allergies  Allergen Reactions   Nitrofurantoin Monohyd Macro     Lip swelling   Wasp Venom Hives   Prior to Admission medications   Medication Sig Start Date End Date Taking? Authorizing Provider  amLODipine (NORVASC) 2.5 MG tablet Take 1 tablet (2.5 mg total) by mouth daily. 07/21/22  Yes Shade Flood, MD  Cholecalciferol (VITAMIN D-1000 MAX ST) 25 MCG (1000 UT) tablet Take by mouth.   Yes [provider]  EPINEPHrine (AUVI-Q) 0.3 mg/0.3 mL IJ SOAJ injection Inject 0.3 mg into the muscle as needed for anaphylaxis. 02/17/21  Yes Ellamae Sia, DO  estradiol (ESTRACE) 0.1 MG/GM vaginal cream Insert vaginally twice weekly 08/31/22  Yes Earlene Plater, Tiffany A, NP  FLUoxetine (PROZAC) 20 MG capsule Take 1 capsule (20 mg total) by mouth daily. 07/21/22  Yes Shade Flood, MD  hydrOXYzine (ATARAX) 25 MG tablet Take 0.5-1 tablets (12.5-25 mg total) by mouth at bedtime as needed for anxiety. 02/01/23  Yes Shade Flood, MD  Iron-Vitamin  C (IRON 100/C PO) Take by mouth.   Yes [provider]  Multiple Vitamin (MULTIVITAMIN) tablet Take 1 tablet by mouth daily.   Yes [provider]   Social History   Socioeconomic History   Marital status: Married    Spouse name: Not on file   Number of children: Not on file   Years of education: Not on file   Highest education level: Not on file  Occupational History   Not on file  Tobacco Use   Smoking status: Never    Passive exposure: Past   Smokeless tobacco: Never  Vaping Use   Vaping status: Never Used  Substance and Sexual Activity   Alcohol use: Yes    Alcohol/week: 2.0 standard drinks of alcohol    Types: 2 Glasses of  wine per week   Drug use: No   Sexual activity: Yes    Partners: Male    Birth control/protection: Post-menopausal    Comment: INTERCOUSRE AGE 78, SEXUAL PARTNERS LESS THAN 5  Other Topics Concern   Not on file  Social History Narrative   Not on file   Social Determinants of Health   Financial Resource Strain: Not on file  Food Insecurity: Not on file  Transportation Needs: Not on file  Physical Activity: Not on file  Stress: Not on file  Social Connections: Not on file  Intimate Partner Violence: Not on file    Review of Systems  Per HPI.  Objective:   Vitals:   03/08/23 1001  BP: 130/68  Pulse: 75  Temp: (!) 97.4 F (36.3 C)  SpO2: 99%  Weight: 140 lb (63.5 kg)  Height: 5\' 6"  (1.676 m)     Physical Exam Vitals reviewed.  Constitutional:      General: She is not in acute distress.    Appearance: Normal appearance. She is well-developed.  HENT:     Head: Normocephalic and atraumatic.     Right Ear: Hearing, tympanic membrane, ear canal and external ear normal.     Left Ear: Hearing, tympanic membrane, ear canal and external ear normal.     Nose: Nose normal.     Mouth/Throat:     Pharynx: No oropharyngeal exudate.  Eyes:     Conjunctiva/sclera: Conjunctivae normal.     Pupils: Pupils are equal, round, and reactive to light.  Neck:     Vascular: No carotid bruit.  Cardiovascular:     Rate and Rhythm: Normal rate and regular rhythm.     Heart sounds: Normal heart sounds. No murmur heard. Pulmonary:     Effort: Pulmonary effort is normal. No respiratory distress.     Breath sounds: Normal breath sounds. No wheezing or rhonchi.  Abdominal:     Palpations: Abdomen is soft. There is no pulsatile mass.     Tenderness: There is no abdominal tenderness.  Musculoskeletal:     Right lower leg: No edema.     Left lower leg: No edema.  Skin:    General: Skin is warm and dry.     Findings: No rash.  Neurological:     Mental Status: She is alert and oriented  to person, place, and time.  Psychiatric:        Mood and Affect: Mood normal.        Behavior: Behavior normal.        Assessment & Plan:  Lindsey Sanford is a 62 y.o. female . Hyponatremia - Plan: Basic metabolic panel  -Asymptomatic.  Unlikely that  her few episodes of dizziness that resolved in a few minutes were truly related to hyponatremia.  Otherwise asymptomatic.  Will repeat BMP.  Would consider stopping SSRI as that may be culprit.  Would need to supplement with other medication for anxiety treatment, possible buspirone?  Will need to look into that further once I review lab results.  No changes at this time.  Upper respiratory tract infection, unspecified type  -Likely viral illness, improving, continue symptomatic care with RTC precautions.  Reassuring exam.  No orders of the defined types were placed in this encounter.  Patient Instructions  Thanks for coming in today.  I will recheck the sodium level on electrolytes today.  If still at the lower range, would consider trying something different than fluoxetine as that medicine can sometimes cause low sodium.  We would add a different medicine to help manage symptoms.  Let's wait on lab results before we make any changes and then can discuss plan further.  If any new symptoms, be seen as we discussed but I do not expect that to occur.  I do think you likely have a viral respiratory illness.  See information below.  Glad to hear that is improving. Return to the clinic or go to the nearest emergency room if any of your symptoms worsen or new symptoms occur.  Upper Respiratory Infection, Adult An upper respiratory infection (URI) is a common viral infection of the nose, throat, and upper air passages that lead to the lungs. The most common type of URI is the common cold. URIs usually get better on their own, without medical treatment. What are the causes? A URI is caused by a virus. You may catch a virus by: Breathing in  droplets from an infected person's cough or sneeze. Touching something that has been exposed to the virus (is contaminated) and then touching your mouth, nose, or eyes. What increases the risk? You are more likely to get a URI if: You are very young or very old. You have close contact with others, such as at work, school, or a health care facility. You smoke. You have long-term (chronic) heart or lung disease. You have a weakened disease-fighting system (immune system). You have nasal allergies or asthma. You are experiencing a lot of stress. You have poor nutrition. What are the signs or symptoms? A URI usually involves some of the following symptoms: Runny or stuffy (congested) nose. Cough. Sneezing. Sore throat. Headache. Fatigue. Fever. Loss of appetite. Pain in your forehead, behind your eyes, and over your cheekbones (sinus pain). Muscle aches. Redness or irritation of the eyes. Pressure in the ears or face. How is this diagnosed? This condition may be diagnosed based on your medical history and symptoms, and a physical exam. Your health care provider may use a swab to take a mucus sample from your nose (nasal swab). This sample can be tested to determine what virus is causing the illness. How is this treated? URIs usually get better on their own within 7-10 days. Medicines cannot cure URIs, but your health care provider may recommend certain medicines to help relieve symptoms, such as: Over-the-counter cold medicines. Cough suppressants. Coughing is a type of defense against infection that helps to clear the respiratory system, so take these medicines only as recommended by your health care provider. Fever-reducing medicines. Follow these instructions at home: Activity Rest as needed. If you have a fever, stay home from work or school until your fever is gone or until your health care provider  says your URI cannot spread to other people (is no longer contagious). Your  health care provider may have you wear a face mask to prevent your infection from spreading. Relieving symptoms Gargle with a mixture of salt and water 3-4 times a day or as needed. To make salt water, completely dissolve -1 tsp (3-6 g) of salt in 1 cup (237 mL) of warm water. Use a cool-mist humidifier to add moisture to the air. This can help you breathe more easily. Eating and drinking  Drink enough fluid to keep your urine pale yellow. Eat soups and other clear broths. General instructions  Take over-the-counter and prescription medicines only as told by your health care provider. These include cold medicines, fever reducers, and cough suppressants. Do not use any products that contain nicotine or tobacco. These products include cigarettes, chewing tobacco, and vaping devices, such as e-cigarettes. If you need help quitting, ask your health care provider. Stay away from secondhand smoke. Stay up to date on all immunizations, including the yearly (annual) flu vaccine. Keep all follow-up visits. This is important. How to prevent the spread of infection to others URIs can be contagious. To prevent the infection from spreading: Wash your hands with soap and water for at least 20 seconds. If soap and water are not available, use hand sanitizer. Avoid touching your mouth, face, eyes, or nose. Cough or sneeze into a tissue or your sleeve or elbow instead of into your hand or into the air.  Contact a health care provider if: You are getting worse instead of better. You have a fever or chills. Your mucus is brown or red. You have yellow or brown discharge coming from your nose. You have pain in your face, especially when you bend forward. You have swollen neck glands. You have pain while swallowing. You have white areas in the back of your throat. Get help right away if: You have shortness of breath that gets worse. You have severe or persistent: Headache. Ear pain. Sinus pain. Chest  pain. You have chronic lung disease along with any of the following: Making high-pitched whistling sounds when you breathe, most often when you breathe out (wheezing). Prolonged cough (more than 14 days). Coughing up blood. A change in your usual mucus. You have a stiff neck. You have changes in your: Vision. Hearing. Thinking. Mood. These symptoms may be an emergency. Get help right away. Call 911. Do not wait to see if the symptoms will go away. Do not drive yourself to the hospital. Summary An upper respiratory infection (URI) is a common infection of the nose, throat, and upper air passages that lead to the lungs. A URI is caused by a virus. URIs usually get better on their own within 7-10 days. Medicines cannot cure URIs, but your health care provider may recommend certain medicines to help relieve symptoms. This information is not intended to replace advice given to you by your health care provider. Make sure you discuss any questions you have with your health care provider. Document Revised: 10/28/2020 Document Reviewed: 10/28/2020 Elsevier Patient Education  2024 [employer]     Signed,   Meredith Staggers, MD  Primary Care, [facility] 03/08/23 10:37 AM

## 2023-03-17 ENCOUNTER — Encounter: Payer: Self-pay | Admitting: Family Medicine

## 2023-03-17 NOTE — Telephone Encounter (Signed)
Patient requested to wait till after the holidays before making changes, reports she will eat lots of salt, also has a question about if switching to a SNRI would effect her sodium level.  Please advise   Lab note: Sodium level was stable at 130.  Let me know if you would like to try to transition off of fluoxetine and could start buspirone instead.  We could also delay this until the first of the year if that would be easier since recent levels are stable.  Let me know your thoughts.   Dr. Neva Seat

## 2023-03-20 ENCOUNTER — Encounter: Payer: Self-pay | Admitting: Family Medicine

## 2023-03-20 ENCOUNTER — Ambulatory Visit: Payer: BC Managed Care – PPO | Admitting: Family Medicine

## 2023-03-20 VITALS — BP 122/64 | HR 72 | Temp 98.6°F | Resp 20 | Ht 66.0 in | Wt 139.0 lb

## 2023-03-20 DIAGNOSIS — J0140 Acute pansinusitis, unspecified: Secondary | ICD-10-CM | POA: Diagnosis not present

## 2023-03-20 MED ORDER — AMOXICILLIN-POT CLAVULANATE 875-125 MG PO TABS
1.0000 | ORAL_TABLET | Freq: Two times a day (BID) | ORAL | 0 refills | Status: AC
Start: 2023-03-20 — End: 2023-03-27

## 2023-03-20 NOTE — Patient Instructions (Signed)
Throat lozenges, chloraseptic spray, warm salt water gargles, hot tea/honey, cough syrup (Delsym), Tylenol/Ibuprofen, Vicks, and a humidifier at night.  

## 2023-03-20 NOTE — Progress Notes (Signed)
Assessment & Plan:  1. Acute non-recurrent pansinusitis Education provided on sinus infections.  Encouraged symptom management including throat lozenges, chloraseptic spray, warm salt water gargles, hot tea/honey, cough syrup (Delsym), Tylenol/Ibuprofen, Vicks, and a humidifier at night.  - amoxicillin-clavulanate (AUGMENTIN) 875-125 MG tablet; Take 1 tablet by mouth 2 (two) times daily for 7 days.  Dispense: 14 tablet; Refill: 0  No results found for any visits on 03/20/23.  Follow up plan: Return if symptoms worsen or fail to improve.  [name], MSN, APRN, FNP-C  Subjective:  HPI: Lindsey Sanford is a 61 y.o. female presenting on 03/20/2023 for URI (Congestion, throat irritation, cough, ear pressure bilateral, hoarse this started 2 weeks ago )  Patient complains of cough, congestion, throat irritation, ear pressure, and hoarse. She denies fever. Onset of symptoms was 2 weeks ago, unchanged since that time. She is drinking plenty of fluids. Evaluation to date: home COVID test negative x 2. Treatment to date:  DayQuil, NyQuil, vitamin C .  She does not smoke.    ROS: Negative unless specifically indicated above in HPI.   Relevant past medical history reviewed and updated as indicated.   Allergies and medications reviewed and updated.   Current Outpatient Medications:    amLODipine (NORVASC) 2.5 MG tablet, Take 1 tablet (2.5 mg total) by mouth daily., Disp: 90 tablet, Rfl: 3   Cholecalciferol (VITAMIN D-1000 MAX ST) 25 MCG (1000 UT) tablet, Take by mouth., Disp: , Rfl:    EPINEPHrine (AUVI-Q) 0.3 mg/0.3 mL IJ SOAJ injection, Inject 0.3 mg into the muscle as needed for anaphylaxis., Disp: 4 each, Rfl: 1   estradiol (ESTRACE) 0.1 MG/GM vaginal cream, Insert vaginally twice weekly, Disp: 42.5 g, Rfl: 2   FLUoxetine (PROZAC) 20 MG capsule, Take 1 capsule (20 mg total) by mouth daily., Disp: 90 capsule, Rfl: 3   Iron-Vitamin C (IRON 100/C PO), Take by mouth., Disp: , Rfl:     Multiple Vitamin (MULTIVITAMIN) tablet, Take 1 tablet by mouth daily., Disp: , Rfl:    hydrOXYzine (ATARAX) 25 MG tablet, Take 0.5-1 tablets (12.5-25 mg total) by mouth at bedtime as needed for anxiety. (Patient not taking: Reported on 03/20/2023), Disp: 90 tablet, Rfl: 1  Allergies  Allergen Reactions   Nitrofurantoin Monohyd Macro     Lip swelling   Wasp Venom Hives    Objective:   BP 122/64   Pulse 72   Temp 98.6 F (37 C)   Resp 20   Ht 5\' 6"  (1.676 m)   Wt 139 lb (63 kg)   LMP 09/11/2013   SpO2 98%   BMI 22.44 kg/m    Physical Exam Vitals reviewed.  Constitutional:      General: She is not in acute distress.    Appearance: Normal appearance. She is not ill-appearing, toxic-appearing or diaphoretic.  HENT:     Head: Normocephalic and atraumatic.     Right Ear: Tympanic membrane, ear canal and external ear normal. There is no impacted cerumen.     Left Ear: Tympanic membrane, ear canal and external ear normal. There is no impacted cerumen.     Nose: Congestion present. No rhinorrhea.     Right Sinus: Maxillary sinus tenderness and frontal sinus tenderness present.     Left Sinus: Maxillary sinus tenderness and frontal sinus tenderness present.     Mouth/Throat:     Mouth: Mucous membranes are moist.     Pharynx: Oropharynx is clear. No oropharyngeal exudate or posterior oropharyngeal erythema.  Eyes:  General: No scleral icterus.       Right eye: No discharge.        Left eye: No discharge.     Conjunctiva/sclera: Conjunctivae normal.  Cardiovascular:     Rate and Rhythm: Normal rate and regular rhythm.     Heart sounds: Normal heart sounds. No murmur heard.    No friction rub. No gallop.  Pulmonary:     Effort: Pulmonary effort is normal. No respiratory distress.     Breath sounds: Normal breath sounds. No stridor. No wheezing, rhonchi or rales.  Musculoskeletal:        General: Normal range of motion.     Cervical back: Normal range of motion.   Lymphadenopathy:     Cervical: No cervical adenopathy.  Skin:    General: Skin is warm and dry.     Capillary Refill: Capillary refill takes less than 2 seconds.  Neurological:     General: No focal deficit present.     Mental Status: She is alert and oriented to person, place, and time. Mental status is at baseline.  Psychiatric:        Mood and Affect: Mood normal.        Behavior: Behavior normal.        Thought Content: Thought content normal.        Judgment: Judgment normal.

## 2023-03-21 ENCOUNTER — Ambulatory Visit (AMBULATORY_SURGERY_CENTER): Payer: BC Managed Care – PPO

## 2023-03-21 VITALS — Ht 67.0 in | Wt 139.0 lb

## 2023-03-21 DIAGNOSIS — Z860100 Personal history of colon polyps, unspecified: Secondary | ICD-10-CM

## 2023-03-21 MED ORDER — NA SULFATE-K SULFATE-MG SULF 17.5-3.13-1.6 GM/177ML PO SOLN: 1.0000 | Freq: Once | ORAL | 0 refills | Status: AC

## 2023-03-21 NOTE — Progress Notes (Signed)

## 2023-03-22 ENCOUNTER — Telehealth: Payer: Self-pay | Admitting: Family Medicine

## 2023-03-22 NOTE — Telephone Encounter (Signed)
Called and advised the patient that she should reach out to the treating physician if it continues. She will call if it continues tomorrow otherwise she is doing better than previously

## 2023-03-22 NOTE — Telephone Encounter (Signed)
Caller name: TARONDA KOKES  On DPR?: Yes  Call back number: [phone] (home)  Provider they see: Shade Flood, MD  Reason for call:   Taking ABX Augumentin started on Mon 03/20/23 it has given her diarrhea does she keep taking it- head not clear and is eating yogurt- advise

## 2023-04-21 ENCOUNTER — Encounter: Payer: Self-pay | Admitting: Gastroenterology

## 2023-04-26 ENCOUNTER — Ambulatory Visit: Payer: BC Managed Care – PPO | Admitting: Gastroenterology

## 2023-04-26 ENCOUNTER — Encounter: Payer: Self-pay | Admitting: Gastroenterology

## 2023-04-26 VITALS — BP 146/72 | HR 73 | Temp 97.2°F | Resp 10 | Ht 67.0 in | Wt 139.0 lb

## 2023-04-26 DIAGNOSIS — K641 Second degree hemorrhoids: Secondary | ICD-10-CM | POA: Diagnosis not present

## 2023-04-26 DIAGNOSIS — Z1211 Encounter for screening for malignant neoplasm of colon: Secondary | ICD-10-CM | POA: Diagnosis present

## 2023-04-26 DIAGNOSIS — D123 Benign neoplasm of transverse colon: Secondary | ICD-10-CM | POA: Diagnosis not present

## 2023-04-26 DIAGNOSIS — Z860100 Personal history of colon polyps, unspecified: Secondary | ICD-10-CM

## 2023-04-26 DIAGNOSIS — Q438 Other specified congenital malformations of intestine: Secondary | ICD-10-CM | POA: Diagnosis not present

## 2023-04-26 MED ORDER — SODIUM CHLORIDE 0.9 % IV SOLN: 500.0000 mL | Freq: Once | INTRAVENOUS | Status: AC

## 2023-04-26 NOTE — Progress Notes (Signed)
 Called to room to assist during endoscopic procedure.  Patient ID and intended procedure confirmed with present staff. Received instructions for my participation in the procedure from the performing physician.

## 2023-04-26 NOTE — Op Note (Signed)
 [facility] Patient Name: Lindsey Sanford Procedure Date: 04/26/2023 8:30 AM MRN: [id number] Endoscopist: Yong Henle , MD, [id number] Age: 63 Referring MD:  Date of Birth: Aug 17, 1960 Gender: Female Account #: [id number] Procedure:                Colonoscopy Indications:              Screening for colorectal malignant neoplasm,                            Previous fair preparation Medicines:                Monitored Anesthesia Care Procedure:                Pre-Anesthesia Assessment:                           - Prior to the procedure, a History and Physical                            was performed, and patient medications and                            allergies were reviewed. The patient's tolerance of                            previous anesthesia was also reviewed. The risks                            and benefits of the procedure and the sedation                            options and risks were discussed with the patient.                            All questions were answered, and informed consent                            was obtained. Prior Anticoagulants: The patient has                            taken no anticoagulant or antiplatelet agents. ASA                            Grade Assessment: II - A patient with mild systemic                            disease. After reviewing the risks and benefits,                            the patient was deemed in satisfactory condition to                            undergo the procedure.  After obtaining informed consent, the colonoscope                            was passed under direct vision. Throughout the                            procedure, the patient's blood pressure, pulse, and                            oxygen saturations were monitored continuously. The                            PCF-HQ190L Colonoscope [id number] was introduced                            through the anus and advanced to  the the cecum,                            identified by transillumination. The colonoscopy                            was performed without difficulty. The patient                            tolerated the procedure. The quality of the bowel                            preparation was excellent. The ileocecal valve,                            appendiceal orifice, and rectum were photographed. Scope In: 8:40:20 AM Scope Out: 8:52:46 AM Scope Withdrawal Time: 0 hours 8 minutes 42 seconds  Total Procedure Duration: 0 hours 12 minutes 26 seconds  Findings:                 The digital rectal exam findings include                            hemorrhoids. Pertinent negatives include no                            palpable rectal lesions.                           The left colon was moderately tortuous.                           A tattoo was seen in the ascending colon. The                            tattoo site appeared normal without any evidence of                            adenomatous or serrated tissue.  A 5 mm polyp was found in the hepatic flexure. The                            polyp was sessile. The polyp was removed with a                            cold snare. Resection and retrieval were complete.                           Normal mucosa was found in the entire colon                            otherwise.                           Non-bleeding non-thrombosed internal hemorrhoids                            were found during retroflexion, during perianal                            exam and during digital exam. The hemorrhoids were                            Grade II (internal hemorrhoids that prolapse but                            reduce spontaneously). Complications:            No immediate complications. Estimated Blood Loss:     Estimated blood loss was minimal. Impression:               - Hemorrhoids found on digital rectal exam.                           -  Tortuous colon.                           - Tattoo noted in the ascending colon.                           - One 5 mm polyp at the hepatic flexure, removed                            with a cold snare. Resected and retrieved.                           - Normal mucosa in the entire examined colon                            otherwise.                           - Non-bleeding non-thrombosed internal hemorrhoids. Recommendation:           - The patient will be observed post-procedure,  until all discharge criteria are met.                           - Discharge patient to home.                           - Patient has a contact number available for                            emergencies. The signs and symptoms of potential                            delayed complications were discussed with the                            patient. Return to normal activities tomorrow.                            Written discharge instructions were provided to the                            patient.                           - High fiber diet.                           - Use FiberCon 1-2 tablets PO daily.                           - Continue present medications.                           - Await pathology results.                           - Repeat colonoscopy in 5-10 years for surveillance                            based on pathology results (if patient's tissue is                            not dysplastic, 10-year recommendation will be                            placed) - same preparation for next procedure..                           - The findings and recommendations were discussed                            with the patient.                           - The findings and recommendations were discussed  with the patient's family. Yong Henle, MD 04/26/2023 8:59:56 AM

## 2023-04-26 NOTE — Progress Notes (Signed)
 GASTROENTEROLOGY PROCEDURE H&P NOTE   Primary Care Physician: Benjiman Bras, MD  HPI: Lindsey Sanford is a 63 y.o. female who presents for Colonoscopy for history of adenomas.  Past Medical History:  Diagnosis Date   Anemia    Anxiety    Depression    Hypertension    Mouth ulcers    most of your life/ due to stress   Osteopenia    Osteoporosis    Past Surgical History:  Procedure Laterality Date   FOOT SURGERY  1985   right foot   HERNIA REPAIR     s   TONSILLECTOMY     27-34 years old   Current Outpatient Medications  Medication Sig Dispense Refill   amLODipine  (NORVASC ) 2.5 MG tablet Take 1 tablet (2.5 mg total) by mouth daily. 90 tablet 3   Cholecalciferol (VITAMIN D -1000 MAX ST) 25 MCG (1000 UT) tablet Take by mouth.     estradiol  (ESTRACE ) 0.1 MG/GM vaginal cream Insert vaginally twice weekly 42.5 g 2   FLUoxetine  (PROZAC ) 20 MG capsule Take 1 capsule (20 mg total) by mouth daily. 90 capsule 3   EPINEPHrine  (AUVI-Q ) 0.3 mg/0.3 mL IJ SOAJ injection Inject 0.3 mg into the muscle as needed for anaphylaxis. 4 each 1   hydrOXYzine  (ATARAX ) 25 MG tablet Take 0.5-1 tablets (12.5-25 mg total) by mouth at bedtime as needed for anxiety. (Patient not taking: Reported on 03/20/2023) 90 tablet 1   Iron-Vitamin C (IRON 100/C PO) Take by mouth. (Patient not taking: Reported on 04/26/2023)     Multiple Vitamin (MULTIVITAMIN) tablet Take 1 tablet by mouth daily. (Patient not taking: Reported on 04/26/2023)     Current Facility-Administered Medications  Medication Dose Route Frequency Provider Last Rate Last Admin   0.9 %  sodium chloride  infusion  500 mL Intravenous Once Mansouraty, Latanza Pfefferkorn Jr., MD        Current Outpatient Medications:    amLODipine  (NORVASC ) 2.5 MG tablet, Take 1 tablet (2.5 mg total) by mouth daily., Disp: 90 tablet, Rfl: 3   Cholecalciferol (VITAMIN D -1000 MAX ST) 25 MCG (1000 UT) tablet, Take by mouth., Disp: , Rfl:    estradiol  (ESTRACE ) 0.1 MG/GM  vaginal cream, Insert vaginally twice weekly, Disp: 42.5 g, Rfl: 2   FLUoxetine  (PROZAC ) 20 MG capsule, Take 1 capsule (20 mg total) by mouth daily., Disp: 90 capsule, Rfl: 3   EPINEPHrine  (AUVI-Q ) 0.3 mg/0.3 mL IJ SOAJ injection, Inject 0.3 mg into the muscle as needed for anaphylaxis., Disp: 4 each, Rfl: 1   hydrOXYzine  (ATARAX ) 25 MG tablet, Take 0.5-1 tablets (12.5-25 mg total) by mouth at bedtime as needed for anxiety. (Patient not taking: Reported on 03/20/2023), Disp: 90 tablet, Rfl: 1   Iron-Vitamin C (IRON 100/C PO), Take by mouth. (Patient not taking: Reported on 04/26/2023), Disp: , Rfl:    Multiple Vitamin (MULTIVITAMIN) tablet, Take 1 tablet by mouth daily. (Patient not taking: Reported on 04/26/2023), Disp: , Rfl:   Current Facility-Administered Medications:    0.9 %  sodium chloride  infusion, 500 mL, Intravenous, Once, Mansouraty, Albino Alu., MD Allergies  Allergen Reactions   Nitrofurantoin Monohyd Macro     Lip swelling   Wasp Venom Hives   Family History  Problem Relation Age of Onset   Allergic rhinitis Mother        food allergies   Diabetes Father    Eczema Father    Heart disease Father    Eczema Sister    Breast cancer Paternal Aunt  Colon cancer Neg Hx    Rectal cancer Neg Hx    Colon polyps Neg Hx    Esophageal cancer Neg Hx    Stomach cancer Neg Hx    Social History   Socioeconomic History   Marital status: Married    Spouse name: Not on file   Number of children: Not on file   Years of education: Not on file   Highest education level: Not on file  Occupational History   Not on file  Tobacco Use   Smoking status: Never    Passive exposure: Past   Smokeless tobacco: Never  Vaping Use   Vaping status: Never Used  Substance and Sexual Activity   Alcohol use: Not Currently    Alcohol/week: 2.0 standard drinks of alcohol    Types: 2 Glasses of wine per week   Drug use: No   Sexual activity: Yes    Partners: Male    Birth control/protection:  Post-menopausal    Comment: INTERCOUSRE AGE 52, SEXUAL PARTNERS LESS THAN 5  Other Topics Concern   Not on file  Social History Narrative   Not on file   Social Drivers of Health   Financial Resource Strain: Not on file  Food Insecurity: Not on file  Transportation Needs: Not on file  Physical Activity: Not on file  Stress: Not on file  Social Connections: Not on file  Intimate Partner Violence: Not on file    Physical Exam: Today's Vitals   04/26/23 0735  BP: 130/68  Pulse: 75  Temp: (!) 97.2 F (36.2 C)  SpO2: 100%  Weight: 139 lb (63 kg)  Height: 5\' 7"  (1.702 m)   Body mass index is 21.77 kg/m. GEN: NAD EYE: Sclerae anicteric ENT: MMM CV: Non-tachycardic GI: Soft, NT/ND NEURO:  Alert & Oriented x 3  Lab Results: No results for input(s): "WBC", "HGB", "HCT", "PLT" in the last 72 hours. BMET No results for input(s): "NA", "K", "CL", "CO2", "GLUCOSE", "BUN", "CREATININE", "CALCIUM" in the last 72 hours. LFT No results for input(s): "PROT", "ALBUMIN", "AST", "ALT", "ALKPHOS", "BILITOT", "BILIDIR", "IBILI" in the last 72 hours. PT/INR No results for input(s): "LABPROT", "INR" in the last 72 hours.   Impression / Plan: This is a 63 y.o.female who presents for Colonoscopy for history of adenomas.  The risks and benefits of endoscopic evaluation/treatment were discussed with the patient and/or family; these include but are not limited to the risk of perforation, infection, bleeding, missed lesions, lack of diagnosis, severe illness requiring hospitalization, as well as anesthesia and sedation related illnesses.  The patient's history has been reviewed, patient examined, no change in status, and deemed stable for procedure.  The patient and/or family is agreeable to proceed.    Yong Henle, MD [facility]oscopy Office # [phone]

## 2023-04-26 NOTE — Progress Notes (Signed)
 Vss nad trans to pacu

## 2023-04-26 NOTE — Patient Instructions (Signed)
-   High fiber diet. - Use FiberCon 1-2 tablets PO daily. - Continue present medications. - Await pathology results   YOU HAD AN ENDOSCOPIC PROCEDURE TODAY AT THE [facility]:   Refer to the procedure report that was given to you for any specific questions about what was found during the examination.  If the procedure report does not answer your questions, please call your gastroenterologist to clarify.  If you requested that your care partner not be given the details of your procedure findings, then the procedure report has been included in a sealed envelope for you to review at your convenience later.  YOU SHOULD EXPECT: Some feelings of bloating in the abdomen. Passage of more gas than usual.  Walking can help get rid of the air that was put into your GI tract during the procedure and reduce the bloating. If you had a lower endoscopy (such as a colonoscopy or flexible sigmoidoscopy) you may notice spotting of blood in your stool or on the toilet paper. If you underwent a bowel prep for your procedure, you may not have a normal bowel movement for a few days.  Please Note:  You might notice some irritation and congestion in your nose or some drainage.  This is from the oxygen used during your procedure.  There is no need for concern and it should clear up in a day or so.  SYMPTOMS TO REPORT IMMEDIATELY:  Following lower endoscopy (colonoscopy or flexible sigmoidoscopy):  Excessive amounts of blood in the stool  Significant tenderness or worsening of abdominal pains  Swelling of the abdomen that is new, acute  Fever of 100F or higher  For urgent or emergent issues, a gastroenterologist can be reached at any hour by calling [phone]. Do not use MyChart messaging for urgent concerns.    DIET:  We do recommend a small meal at first, but then you may proceed to your regular diet.  Drink plenty of fluids but you should avoid alcoholic beverages for 24 hours.  ACTIVITY:  You  should plan to take it easy for the rest of today and you should NOT DRIVE or use heavy machinery until tomorrow (because of the sedation medicines used during the test).    FOLLOW UP: Our staff will call the number listed on your records the next business day following your procedure.  We will call around 7:15- 8:00 am to check on you and address any questions or concerns that you may have regarding the information given to you following your procedure. If we do not reach you, we will leave a message.     If any biopsies were taken you will be contacted by phone or by letter within the next 1-3 weeks.  Please call us at [phone] if you have not heard about the biopsies in 3 weeks.    SIGNATURES/CONFIDENTIALITY: You and/or your care partner have signed paperwork which will be entered into your electronic medical record.  These signatures attest to the fact that that the information above on your After Visit Summary has been reviewed and is understood.  Full responsibility of the confidentiality of this discharge information lies with you and/or your care-partner.

## 2023-04-27 ENCOUNTER — Telehealth: Payer: Self-pay | Admitting: *Deleted

## 2023-04-27 NOTE — Telephone Encounter (Signed)
  Follow up Call-     04/26/2023    7:35 AM  Call back number  Post procedure Call Back phone  # [phone]  Permission to leave phone message Yes     Patient questions:  Do you have a fever, pain , or abdominal swelling? No. Pain Score  0 *  Have you tolerated food without any problems? Yes.    Have you been able to return to your normal activities? Yes.    Do you have any questions about your discharge instructions: Diet   No. Medications  No. Follow up visit  No.  Do you have questions or concerns about your Care? No.  Actions: * If pain score is 4 or above: No action needed, pain <4.

## 2023-04-28 ENCOUNTER — Encounter: Payer: Self-pay | Admitting: Gastroenterology

## 2023-04-28 LAB — SURGICAL PATHOLOGY

## 2023-05-25 ENCOUNTER — Encounter: Payer: Self-pay | Admitting: Family Medicine

## 2023-05-25 DIAGNOSIS — E871 Hypo-osmolality and hyponatremia: Secondary | ICD-10-CM

## 2023-05-26 NOTE — Addendum Note (Signed)
Addended by: Meredith Staggers R on: 05/26/2023 03:36 PM   Modules accepted: Orders

## 2023-05-26 NOTE — Telephone Encounter (Signed)
Patient sent a message about medication change and declined stating she wants to feel stable for her husbands surgery in April, notes she can come in for labs if that would be preferred

## 2023-05-30 ENCOUNTER — Encounter: Payer: Self-pay | Admitting: Family Medicine

## 2023-05-30 ENCOUNTER — Other Ambulatory Visit (INDEPENDENT_AMBULATORY_CARE_PROVIDER_SITE_OTHER): Payer: 59

## 2023-05-30 DIAGNOSIS — E871 Hypo-osmolality and hyponatremia: Secondary | ICD-10-CM | POA: Diagnosis not present

## 2023-05-30 LAB — BASIC METABOLIC PANEL
BUN: 19 mg/dL (ref 6–23)
CO2: 27 meq/L (ref 19–32)
Calcium: 8.9 mg/dL (ref 8.4–10.5)
Chloride: 98 meq/L (ref 96–112)
Creatinine, Ser: 0.83 mg/dL (ref 0.40–1.20)
GFR: 75.57 mL/min (ref >60.00)
Glucose, Bld: 96 mg/dL (ref 70–99)
Potassium: 4.2 meq/L (ref 3.5–5.1)
Sodium: 132 meq/L — ABNORMAL LOW (ref 135–145)

## 2023-07-25 ENCOUNTER — Encounter: Payer: Self-pay | Admitting: Family Medicine

## 2023-07-25 DIAGNOSIS — E871 Hypo-osmolality and hyponatremia: Secondary | ICD-10-CM

## 2023-07-25 DIAGNOSIS — I10 Essential (primary) hypertension: Secondary | ICD-10-CM

## 2023-07-25 DIAGNOSIS — E785 Hyperlipidemia, unspecified: Secondary | ICD-10-CM

## 2023-07-25 NOTE — Telephone Encounter (Signed)
 Patient has appointment coming up on 08/02/2023 and is questioning if you would like lab work completed before appointment?

## 2023-07-26 NOTE — Telephone Encounter (Signed)
 Orders pended, okay to schedule lab visit here or walk-in to [facility] at least a day or 2 prior to her appointment.  Please arrange.  Thank you.

## 2023-07-27 ENCOUNTER — Other Ambulatory Visit: Payer: Self-pay | Admitting: Family Medicine

## 2023-07-27 DIAGNOSIS — G4700 Insomnia, unspecified: Secondary | ICD-10-CM

## 2023-07-27 DIAGNOSIS — I10 Essential (primary) hypertension: Secondary | ICD-10-CM

## 2023-07-27 DIAGNOSIS — F411 Generalized anxiety disorder: Secondary | ICD-10-CM

## 2023-08-01 ENCOUNTER — Other Ambulatory Visit (INDEPENDENT_AMBULATORY_CARE_PROVIDER_SITE_OTHER)

## 2023-08-01 DIAGNOSIS — I10 Essential (primary) hypertension: Secondary | ICD-10-CM

## 2023-08-01 DIAGNOSIS — E871 Hypo-osmolality and hyponatremia: Secondary | ICD-10-CM | POA: Diagnosis not present

## 2023-08-01 DIAGNOSIS — E785 Hyperlipidemia, unspecified: Secondary | ICD-10-CM

## 2023-08-01 LAB — LIPID PANEL
Cholesterol: 226 mg/dL — ABNORMAL HIGH (ref 0–200)
HDL: 86.6 mg/dL (ref >39.00)
LDL Cholesterol: 127 mg/dL — ABNORMAL HIGH (ref 0–99)
NonHDL: 138.94
Total CHOL/HDL Ratio: 3
Triglycerides: 60 mg/dL (ref 0.0–149.0)
VLDL: 12 mg/dL (ref 0.0–40.0)

## 2023-08-01 LAB — COMPREHENSIVE METABOLIC PANEL WITH GFR
ALT: 15 U/L (ref 0–35)
AST: 18 U/L (ref 0–37)
Albumin: 4 g/dL (ref 3.5–5.2)
Alkaline Phosphatase: 66 U/L (ref 39–117)
BUN: 16 mg/dL (ref 6–23)
CO2: 27 meq/L (ref 19–32)
Calcium: 8.9 mg/dL (ref 8.4–10.5)
Chloride: 98 meq/L (ref 96–112)
Creatinine, Ser: 0.82 mg/dL (ref 0.40–1.20)
GFR: 76.58 mL/min (ref >60.00)
Glucose, Bld: 93 mg/dL (ref 70–99)
Potassium: 3.8 meq/L (ref 3.5–5.1)
Sodium: 131 meq/L — ABNORMAL LOW (ref 135–145)
Total Bilirubin: 0.3 mg/dL (ref 0.2–1.2)
Total Protein: 6.9 g/dL (ref 6.0–8.3)

## 2023-08-02 ENCOUNTER — Ambulatory Visit: Payer: BC Managed Care – PPO | Admitting: Family Medicine

## 2023-08-02 VITALS — BP 122/60 | HR 73 | Temp 97.9°F | Ht 67.0 in | Wt 138.4 lb

## 2023-08-02 DIAGNOSIS — F411 Generalized anxiety disorder: Secondary | ICD-10-CM | POA: Diagnosis not present

## 2023-08-02 DIAGNOSIS — I10 Essential (primary) hypertension: Secondary | ICD-10-CM

## 2023-08-02 DIAGNOSIS — E785 Hyperlipidemia, unspecified: Secondary | ICD-10-CM | POA: Diagnosis not present

## 2023-08-02 DIAGNOSIS — E871 Hypo-osmolality and hyponatremia: Secondary | ICD-10-CM

## 2023-08-02 NOTE — Progress Notes (Signed)
 Subjective:  Patient ID: Lindsey Sanford, female    DOB: 1961-01-31  Age: 63 y.o. MRN: [id number]  CC:  Chief Complaint  Patient presents with   Medical Management of Chronic Issues    Pt is doing well no concerns herself    Health Maintenance    Patient has already done labs so no Hep C or HIV today, notes she is willing to do next shingles shot not due for 3 days but amenable to doing this early if advised     HPI Lindsey Sanford presents for   Hyponatremia Last discussed in November 2024.  Asymptomatic.  Salt liberalization had been discussed previously.  She is on fluoxetine  20 mg daily for anxiety, possible SSRI on hyponatremia.  Option of medication change discussed. Labs obtained prior to today's visit with sodium 131, overall stable from 132 in February, 130 in November, 129 in October 24.  Option to switch to buspirone for primary anxiety treatment versus SNRI.  Deferred changes during the holidays.  Question of depression treatment as that primarily is an issue during winter.   Lab Results  Component Value Date   NA 131 (L) 08/01/2023   K 3.8 08/01/2023   CL 98 08/01/2023   CO2 27 08/01/2023   Hypertension: Amlodipine  2.5 mg daily. No new side effects.  Home readings: 130/70's.  BP Readings from Last 3 Encounters:  08/02/23 122/60  04/26/23 (!) 146/72  03/20/23 122/64   Lab Results  Component Value Date   CREATININE 0.82 08/01/2023   Anxiety/situational anxiety Treated with Prozac  20 mg daily, question contribution to hyponatremia as above.  Symptoms are usually better during the summer.  Hydroxyzine  if needed.  Care provider, which is a stressor.  Stable dosing of medications in October 2024. Husband had renal transplant 4/3.  He is doing well, recovering well. Daughter was donor - she is doing well.  Defers med changes at this time.  Rare hydroxyzine  - 1-2 times since new year.  No vomiting, nausea, fatigue, lethargy, confusion, forgetfulness, headache,  dizziness, gait disturbance, or persistent muscle cramps.      08/02/2023   10:24 AM 02/01/2023   10:33 AM 07/21/2022   10:44 AM 06/03/2021   10:53 AM  GAD 7 : Generalized Anxiety Score  Nervous, Anxious, on Edge 1 1 1 1   Control/stop worrying 1 0 0 0  Worry too much - different things 1 1 0 0  Trouble relaxing 1 0 1 1  Restless 1 1 0 1  Easily annoyed or irritable 1 1 0 1  Afraid - awful might happen 1 0 0 0  Total GAD 7 Score 7 4 2 4   Anxiety Difficulty   Not difficult at all        08/02/2023   10:24 AM 03/20/2023    2:22 PM 02/01/2023   10:33 AM 07/21/2022   10:43 AM 02/24/2022   11:22 AM  Depression screen PHQ 2/9  Decreased Interest 0 0 2 0 1  Down, Depressed, Hopeless 1 1 2  0 0  PHQ - 2 Score 1 1 4  0 1  Altered sleeping 1  0 1 1  Tired, decreased energy 0  2 0 1  Change in appetite 1  1 0 0  Feeling bad or failure about yourself  0  1 1 0  Trouble concentrating 0  0 0 0  Moving slowly or fidgety/restless 0  0 0 0  Suicidal thoughts 0  0 0 0  PHQ-9 Score  3  8 2 3   Difficult doing work/chores   Not difficult at all Not difficult at all    Hyperlipidemia: Recent labs. No statin.  Father with sudden cardiac arrest at 20yo. Smoker.  Discussed option of CCS - deferred for now.  The 10-year ASCVD risk score (Arnett DK, et al., 2019) is: 4.2%   Values used to calculate the score:     Age: 69 years     Sex: Female     Is Non-Hispanic African American: No     Diabetic: No     Tobacco smoker: No     Systolic Blood Pressure: 122 mmHg     Is BP treated: Yes     HDL Cholesterol: 86.6 mg/dL     Total Cholesterol: 226 mg/dL  Lab Results  Component Value Date   CHOL 226 (H) 08/01/2023   HDL 86.60 08/01/2023   LDLCALC 127 (H) 08/01/2023   TRIG 60.0 08/01/2023   CHOLHDL 3 08/01/2023   Lab Results  Component Value Date   ALT 15 08/01/2023   AST 18 08/01/2023   ALKPHOS 66 08/01/2023   BILITOT 0.3 08/01/2023      HM:  Immunization History  Administered Date(s)  Administered   Influenza,inj,Quad PF,6+ Mos 01/23/2019, 01/14/2020, 01/21/2021, 01/10/2022   Moderna Sars-Covid-2 Vaccination 04/09/2019, 05/07/2019   PFIZER(Purple Top)SARS-COV-2 Vaccination 02/13/2020, 11/11/2020, 01/07/2022   Tdap 11/25/2016  Shingrix 2/26 - repeat planned at [facility] after 4/26.    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   Primary osteoarthritis of both hands 02/22/2016   Primary osteoarthritis of both feet 02/22/2016   Fatigue 02/22/2016   Osteochondroma of foot, right 02/22/2016   Anxiety 07/15/2011   Past Medical History:  Diagnosis Date   Anemia    Anxiety    Depression    Hypertension    Mouth ulcers    most of your life/ due to stress   Osteopenia    Osteoporosis    Past Surgical History:  Procedure Laterality Date   FOOT SURGERY  1985   right foot   HERNIA REPAIR     s   TONSILLECTOMY     1-94 years old   Allergies  Allergen Reactions   Nitrofurantoin Monohyd Macro     Lip swelling   Wasp Venom Hives   Prior to Admission medications   Medication Sig Start Date End Date Taking? Authorizing Provider  amLODipine  (NORVASC ) 2.5 MG tablet TAKE 1 TABLET BY MOUTH EVERY DAY 07/27/23  Yes Benjiman Bras, MD  Cholecalciferol (VITAMIN D -1000 MAX ST) 25 MCG (1000 UT) tablet Take by mouth.   Yes [provider]  EPINEPHrine  (AUVI-Q ) 0.3 mg/0.3 mL IJ SOAJ injection Inject 0.3 mg into the muscle as needed for anaphylaxis. 02/17/21  Yes Trudy Fusi, DO  estradiol  (ESTRACE ) 0.1 MG/GM vaginal cream Insert vaginally twice weekly 08/31/22  Yes Lajuana Pilar, Tiffany A, NP  FLUoxetine  (PROZAC ) 20 MG capsule TAKE 1 CAPSULE BY MOUTH EVERY DAY 07/27/23  Yes Benjiman Bras, MD  hydrOXYzine  (ATARAX ) 25 MG tablet Take 0.5-1 tablets (12.5-25 mg  total) by mouth at bedtime as needed for anxiety. Patient not taking: Reported on 08/02/2023 02/01/23   Benjiman Bras, MD  Iron-Vitamin C (IRON 100/C PO) Take by mouth. Patient not taking: Reported  on 08/02/2023    [provider]  Multiple Vitamin (MULTIVITAMIN) tablet Take 1 tablet by mouth daily. Patient not taking: Reported on 03/21/2023    [provider]   Social History   Socioeconomic History   Marital status: Married    Spouse name: Not on file   Number of children: Not on file   Years of education: Not on file   Highest education level: Not on file  Occupational History   Not on file  Tobacco Use   Smoking status: Never    Passive exposure: Past   Smokeless tobacco: Never  Vaping Use   Vaping status: Never Used  Substance and Sexual Activity   Alcohol use: Not Currently    Alcohol/week: 2.0 standard drinks of alcohol    Types: 2 Glasses of wine per week   Drug use: No   Sexual activity: Yes    Partners: Male    Birth control/protection: Post-menopausal    Comment: INTERCOUSRE AGE 47, SEXUAL PARTNERS LESS THAN 5  Other Topics Concern   Not on file  Social History Narrative   Not on file   Social Drivers of Health   Financial Resource Strain: Not on file  Food Insecurity: Not on file  Transportation Needs: Not on file  Physical Activity: Not on file  Stress: Not on file  Social Connections: Not on file  Intimate Partner Violence: Not on file    Review of Systems  Constitutional:  Negative for fatigue and unexpected weight change.  Respiratory:  Negative for chest tightness and shortness of breath.   Cardiovascular:  Negative for chest pain, palpitations and leg swelling.  Gastrointestinal:  Negative for abdominal pain and blood in stool.  Neurological:  Negative for dizziness, syncope, light-headedness and headaches.     Objective:   Vitals:   08/02/23 1025  BP: 122/60  Pulse: 73  Temp: 97.9 F (36.6 C)  TempSrc: Temporal   SpO2: 98%  Weight: 138 lb 6.4 oz (62.8 kg)  Height: 5\' 7"  (1.702 m)     Physical Exam Vitals reviewed.  Constitutional:      Appearance: Normal appearance. She is well-developed.  HENT:     Head: Normocephalic and atraumatic.  Eyes:     Conjunctiva/sclera: Conjunctivae normal.     Pupils: Pupils are equal, round, and reactive to light.  Neck:     Vascular: No carotid bruit.  Cardiovascular:     Rate and Rhythm: Normal rate and regular rhythm.     Heart sounds: Normal heart sounds.  Pulmonary:     Effort: Pulmonary effort is normal.     Breath sounds: Normal breath sounds.  Abdominal:     Palpations: Abdomen is soft. There is no pulsatile mass.     Tenderness: There is no abdominal tenderness.  Musculoskeletal:     Right lower leg: No edema.     Left lower leg: No edema.  Skin:    General: Skin is warm and dry.  Neurological:     Mental Status: She is alert and oriented to person, place, and time.  Psychiatric:        Mood and Affect: Mood normal.        Behavior: Behavior normal.        Assessment & Plan:  Lindsey Sanford is a 63 y.o. female . Hyponatremia - Plan: Basic metabolic panel with GFR  - Monitor for stability with BMP, option to try off SSRI but with stressors, life changes as above we  decided to hold on changes for now, can revisit later in the summer.  Continue to monitor for hyponatremia and potential symptoms of severe hyponatremia discussed with ER precautions.  Essential hypertension - Plan: Basic metabolic panel with GFR  - Stable with current regimen, continue same.  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  Continue to follow.  Hyperlipidemia, unspecified hyperlipidemia type Check labs and adjust plan accordingly.  Low ASCVD risk score previously, however family history of heart disease noted as above, option of coronary calcium  scoring.  No orders of the defined types were placed in this encounter.  Patient Instructions  10-year heart disease risk score was low, and would not recommend medication for cholesterol based on that level.  However with your family history of heart disease in your father, we do have the option of a coronary calcium scoring test that is around $100 out-of-pocket.  That can give us  an idea if there may be some plaque or calcium within the coronary arteries to help decide on medication need.  Let me know if you would like me to order that test.  No med changes today. Have a lab visit few days prior to follow up in 3 months, then can decide about med change for low sodium if needed at that time. Given recent family health changes, I think it is reasonable to wait on med changes for now.       Signed,   Caro Christmas, MD [facility], [facility] 08/02/23 11:16 AM

## 2023-08-02 NOTE — Patient Instructions (Addendum)
 10-year heart disease risk score was low, and would not recommend medication for cholesterol based on that level.  However with your family history of heart disease in your father, we do have the option of a coronary calcium scoring test that is around $100 out-of-pocket.  That can give us  an idea if there may be some plaque or calcium within the coronary arteries to help decide on medication need.  Let me know if you would like me to order that test.  No med changes today. Have a lab visit few days prior to follow up in 3 months, then can decide about med change for low sodium if needed at that time. Given recent family health changes, I think it is reasonable to wait on med changes for now.

## 2023-08-05 ENCOUNTER — Encounter: Payer: Self-pay | Admitting: Family Medicine

## 2023-09-09 ENCOUNTER — Encounter: Payer: Self-pay | Admitting: Nurse Practitioner

## 2023-09-09 DIAGNOSIS — N951 Menopausal and female climacteric states: Secondary | ICD-10-CM

## 2023-09-11 MED ORDER — ESTRADIOL 0.1 MG/GM VA CREA: TOPICAL_CREAM | VAGINAL | 0 refills | Status: DC

## 2023-09-11 NOTE — Telephone Encounter (Signed)
 Med refill request:estradiol  vaginal cream 0.1 mg Last AEX: 08/31/22 -TW Next AEX: 10/19/23 -TW Last MMG (if hormonal med) 12/15/22 -BiRads 1 neg Refill authorized: Please Advise?

## 2023-10-19 ENCOUNTER — Ambulatory Visit: Admitting: Nurse Practitioner

## 2023-10-31 ENCOUNTER — Other Ambulatory Visit (INDEPENDENT_AMBULATORY_CARE_PROVIDER_SITE_OTHER)

## 2023-10-31 DIAGNOSIS — I10 Essential (primary) hypertension: Secondary | ICD-10-CM

## 2023-10-31 DIAGNOSIS — E871 Hypo-osmolality and hyponatremia: Secondary | ICD-10-CM | POA: Diagnosis not present

## 2023-10-31 LAB — BASIC METABOLIC PANEL WITH GFR
BUN: 17 mg/dL (ref 6–23)
CO2: 27 meq/L (ref 19–32)
Calcium: 9.1 mg/dL (ref 8.4–10.5)
Chloride: 98 meq/L (ref 96–112)
Creatinine, Ser: 0.84 mg/dL (ref 0.40–1.20)
GFR: 74.27 mL/min (ref >60.00)
Glucose, Bld: 93 mg/dL (ref 70–99)
Potassium: 4.2 meq/L (ref 3.5–5.1)
Sodium: 131 meq/L — ABNORMAL LOW (ref 135–145)

## 2023-11-02 ENCOUNTER — Ambulatory Visit: Admitting: Family Medicine

## 2023-11-02 ENCOUNTER — Encounter: Payer: Self-pay | Admitting: Family Medicine

## 2023-11-02 VITALS — BP 136/64 | HR 78 | Wt 143.2 lb

## 2023-11-02 DIAGNOSIS — I10 Essential (primary) hypertension: Secondary | ICD-10-CM | POA: Diagnosis not present

## 2023-11-02 DIAGNOSIS — F411 Generalized anxiety disorder: Secondary | ICD-10-CM

## 2023-11-02 DIAGNOSIS — E871 Hypo-osmolality and hyponatremia: Secondary | ICD-10-CM | POA: Diagnosis not present

## 2023-11-02 DIAGNOSIS — Z114 Encounter for screening for human immunodeficiency virus [HIV]: Secondary | ICD-10-CM

## 2023-11-02 DIAGNOSIS — Z1159 Encounter for screening for other viral diseases: Secondary | ICD-10-CM

## 2023-11-02 DIAGNOSIS — E785 Hyperlipidemia, unspecified: Secondary | ICD-10-CM | POA: Diagnosis not present

## 2023-11-02 NOTE — Patient Instructions (Signed)
 Thank you for coming in today.  Recent sodium level looks okay.  Can try coming off the fluoxetine  after the 10 mg dose for a week, then recheck electrolytes with lab work at [facility] location (or schedule lab visit here) about 2 weeks later.  If you do notice significant worsening of anxiety often medication just restarted it and we can discuss other plans.  Depending on the electrolytes can discuss need to either restart fluoxetine  and look at other causes or stay off that medication and look at other options.  Let me know if there are questions.  Hang in there with the life stressors, let me know if I can help!

## 2023-11-02 NOTE — Progress Notes (Signed)
 Subjective:  Patient ID: Lindsey Sanford, female    DOB: [birth year]  Age: 63 y.o. MRN: [id number]  CC:  Chief Complaint  Patient presents with   Medical Management of Chronic Issues    3 month follow up.     HPI Lindsey Sanford presents for  Follow up.  Spouse is healing, back at work. No new health changes.   Hyponatremia See prior visits, asymptomatic.  Salt liberalization initially discussed, she is on SSRI.  Option of medication changes have been discussed as well.  Due to stressors and life events decided to remain on same dose of SSRI. Would like to see if meds may be contributing.  Occasional electrolyte pack few days per week.   Lab Results  Component Value Date   NA 131 (L) 10/31/2023   K 4.2 10/31/2023   CL 98 10/31/2023   CO2 27 10/31/2023  No vomiting, nausea, fatigue, lethargy, confusion, forgetfulness, headache, dizziness, gait disturbance, or persistent muscle cramps.   Hypertension: Amlodipine  2.5 mg daily without any new side effects. Home readings: 120/70 range.  BP Readings from Last 3 Encounters:  11/02/23 136/64  08/02/23 122/60  04/26/23 (!) 146/72   Lab Results  Component Value Date   CREATININE 0.84 10/31/2023   Hyperlipidemia: Low ASCVD risk score of 4.2% in April, option of coronary calcium scoring discussed but deferred at that time.  Father with sudden cardiac arrest at age 72 years of age but he was a smoker.  No current meds.  Diet/exercise approach. Still considering CCS testing.  Lab Results  Component Value Date   CHOL 226 (H) 08/01/2023   HDL 86.60 08/01/2023   LDLCALC 127 (H) 08/01/2023   TRIG 60.0 08/01/2023   CHOLHDL 3 08/01/2023   Lab Results  Component Value Date   ALT 15 08/01/2023   AST 18 08/01/2023   ALKPHOS 66 08/01/2023   BILITOT 0.3 08/01/2023    Anxiety/situational anxiety Prozac  20 mg daily.  Hydroxyzine  if needed for breakthrough symptoms or sleep.  Rare use when discussed in April.  Stressed with spouse  undergoing renal transplant April 3.  Daughter was donor. Doing ok currently on current dose.  Occasional hydroxyzine  only as needed.      11/02/2023    1:01 PM 08/02/2023   10:24 AM 02/01/2023   10:33 AM 07/21/2022   10:44 AM  GAD 7 : Generalized Anxiety Score  Nervous, Anxious, on Edge 1 1 1 1   Control/stop worrying 0 1 0 0  Worry too much - different things 0 1 1 0  Trouble relaxing 1 1 0 1  Restless 0 1 1 0  Easily annoyed or irritable 1 1 1  0  Afraid - awful might happen 0 1 0 0  Total GAD 7 Score 3 7 4 2   Anxiety Difficulty Not difficult at all   Not difficult at all      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   Primary osteoarthritis of both hands 02/22/2016   Primary osteoarthritis of both feet 02/22/2016   Fatigue 02/22/2016   Osteochondroma of foot, right 02/22/2016   Anxiety 07/15/2011   Past Medical History:  Diagnosis Date   Anemia    Anxiety    Depression    Hypertension    Mouth  ulcers    most of your life/ due to stress   Osteopenia    Osteoporosis    Past Surgical History:  Procedure Laterality Date   FOOT SURGERY  1985   right foot   HERNIA REPAIR     s   TONSILLECTOMY     62-46 years old   Allergies  Allergen Reactions   Nitrofurantoin Monohyd Macro     Lip swelling   Wasp Venom Hives   Prior to Admission medications   Medication Sig Start Date End Date Taking? Authorizing Provider  amLODipine  (NORVASC ) 2.5 MG tablet TAKE 1 TABLET BY MOUTH EVERY DAY 07/27/23  Yes Levora Reyes SAUNDERS, MD  EPINEPHrine  (AUVI-Q ) 0.3 mg/0.3 mL IJ SOAJ injection Inject 0.3 mg into the muscle as needed for anaphylaxis. 02/17/21  Yes Luke Orlan HERO, DO  estradiol  (ESTRACE ) 0.1 MG/GM vaginal cream Insert vaginally twice weekly 09/11/23  Yes Prentiss,  Tiffany A, NP  FLUoxetine  (PROZAC ) 20 MG capsule TAKE 1 CAPSULE BY MOUTH EVERY DAY 07/27/23  Yes Levora Reyes SAUNDERS, MD  hydrOXYzine  (ATARAX ) 25 MG tablet Take 0.5-1 tablets (12.5-25 mg total) by mouth at bedtime as needed for anxiety. 02/01/23  Yes Levora Reyes SAUNDERS, MD  Iron-Vitamin C (IRON 100/C PO) Take by mouth.   Yes [provider]  Multiple Vitamin (MULTIVITAMIN) tablet Take 1 tablet by mouth daily.   Yes [provider]  Cholecalciferol (VITAMIN D -1000 MAX ST) 25 MCG (1000 UT) tablet Take by mouth. Patient not taking: Reported on 11/02/2023    [provider]   Social History   Socioeconomic History   Marital status: Married    Spouse name: Not on file   Number of children: Not on file   Years of education: Not on file   Highest education level: Not on file  Occupational History   Not on file  Tobacco Use   Smoking status: Never    Passive exposure: Past   Smokeless tobacco: Never  Vaping Use   Vaping status: Never Used  Substance and Sexual Activity   Alcohol use: Not Currently    Alcohol/week: 2.0 standard drinks of alcohol    Types: 2 Glasses of wine per week   Drug use: No   Sexual activity: Yes    Partners: Male    Birth control/protection: Post-menopausal    Comment: INTERCOUSRE AGE 42, SEXUAL PARTNERS LESS THAN 5  Other Topics Concern   Not on file  Social History Narrative   Not on file   Social Drivers of Health   Financial Resource Strain: Not on file  Food Insecurity: Not on file  Transportation Needs: Not on file  Physical Activity: Not on file  Stress: Not on file  Social Connections: Not on file  Intimate Partner Violence: Not on file    Review of Systems Per hpi  Objective:   Vitals:   11/02/23 1258  BP: 136/64  Pulse: 78  SpO2: 99%  Weight: 143 lb 3.2 oz (65 kg)     Physical Exam Vitals reviewed.  Constitutional:      Appearance: Normal appearance. She is well-developed.  HENT:     Head: Normocephalic  and atraumatic.  Eyes:     Conjunctiva/sclera: Conjunctivae normal.     Pupils: Pupils are equal, round, and reactive to light.  Neck:     Vascular: No carotid bruit.  Cardiovascular:     Rate and Rhythm: Normal rate and regular rhythm.     Heart sounds: Normal heart sounds.  Pulmonary:     Effort: Pulmonary effort is normal.     Breath sounds: Normal breath sounds.  Abdominal:     Palpations: Abdomen is soft. There is no pulsatile mass.     Tenderness: There is no abdominal tenderness.  Musculoskeletal:     Right lower leg: No edema.     Left lower leg: No edema.  Skin:    General: Skin is warm and dry.  Neurological:     Mental Status: She is alert and oriented to person, place, and time.  Psychiatric:        Mood and Affect: Mood normal.        Behavior: Behavior normal.        Assessment & Plan:  ALLENE FURUYA is a 63 y.o. female . Hyponatremia - Plan: Basic metabolic panel with GFR  - Mild based on most recent level 131, asymptomatic.  She plans to try coming off of the SSRI to see if that may be the culprit, check labs within a few weeks of stopping Prozac .  However if she does have flare of anxiety off meds can restart Prozac  and we can look at other possible causes, especially at this mild hyponatremia.  RTC precautions.  Essential hypertension  - Stable with current regimen, no changes.  Plan on dated labs as above in the next few weeks.  Generalized anxiety disorder  - Situational stressors but overall handling well currently, plan is to try cutting back on the Prozac  as above, tapering off meds to see if that may be responsible for the burning premium but if she does have flare of symptoms restart Prozac  at prior dose and she will let me know.  Hyperlipidemia, unspecified hyperlipidemia type  - Low ASCVD risk score, continue diet/exercise approach.  Option of coronary calcium scoring discussed.    Need for hepatitis C screening test - Plan: Hepatitis C  antibody  Encounter for screening for HIV - Plan: HIV Antibody (routine testing w rflx)   No orders of the defined types were placed in this encounter.  Patient Instructions  Thank you for coming in today.  Recent sodium level looks okay.  Can try coming off the fluoxetine  after the 10 mg dose for a week, then recheck electrolytes with lab work at [facility] location (or schedule lab visit here) about 2 weeks later.  If you do notice significant worsening of anxiety often medication just restarted it and we can discuss other plans.  Depending on the electrolytes can discuss need to either restart fluoxetine  and look at other causes or stay off that medication and look at other options.  Let me know if there are questions.  Hang in there with the life stressors, let me know if I can help!    Signed,   Reyes Pines, MD [facility], [facility] 11/02/23 1:40 PM

## 2023-12-12 ENCOUNTER — Encounter: Payer: Self-pay | Admitting: Family Medicine

## 2023-12-12 ENCOUNTER — Other Ambulatory Visit (INDEPENDENT_AMBULATORY_CARE_PROVIDER_SITE_OTHER)

## 2023-12-12 ENCOUNTER — Ambulatory Visit: Payer: Self-pay | Admitting: Family Medicine

## 2023-12-12 DIAGNOSIS — Z114 Encounter for screening for human immunodeficiency virus [HIV]: Secondary | ICD-10-CM

## 2023-12-12 DIAGNOSIS — E871 Hypo-osmolality and hyponatremia: Secondary | ICD-10-CM

## 2023-12-12 DIAGNOSIS — Z1159 Encounter for screening for other viral diseases: Secondary | ICD-10-CM | POA: Diagnosis not present

## 2023-12-12 LAB — BASIC METABOLIC PANEL WITH GFR
BUN: 16 mg/dL (ref 6–23)
CO2: 24 meq/L (ref 19–32)
Calcium: 8.6 mg/dL (ref 8.4–10.5)
Chloride: 102 meq/L (ref 96–112)
Creatinine, Ser: 0.87 mg/dL (ref 0.40–1.20)
GFR: 71.15 mL/min (ref >60.00)
Glucose, Bld: 112 mg/dL — ABNORMAL HIGH (ref 70–99)
Potassium: 3.9 meq/L (ref 3.5–5.1)
Sodium: 135 meq/L (ref 135–145)

## 2023-12-13 LAB — HEPATITIS C ANTIBODY: Hepatitis C Ab: NONREACTIVE

## 2023-12-13 LAB — HIV ANTIBODY (ROUTINE TESTING W REFLEX): HIV 1&2 Ab, 4th Generation: NONREACTIVE

## 2023-12-13 NOTE — Telephone Encounter (Signed)
 Patient states she ate the inside of an apple pie before having blood work completed. Patient also states she does not want to start anti-depressant/anti-anxiety medications at this time.

## 2023-12-19 ENCOUNTER — Encounter: Payer: Self-pay | Admitting: Nurse Practitioner

## 2023-12-19 ENCOUNTER — Ambulatory Visit (INDEPENDENT_AMBULATORY_CARE_PROVIDER_SITE_OTHER): Admitting: Nurse Practitioner

## 2023-12-19 VITALS — BP 118/70 | HR 81 | Ht 66.75 in | Wt 140.0 lb

## 2023-12-19 DIAGNOSIS — Z1331 Encounter for screening for depression: Secondary | ICD-10-CM | POA: Diagnosis not present

## 2023-12-19 DIAGNOSIS — Z01419 Encounter for gynecological examination (general) (routine) without abnormal findings: Secondary | ICD-10-CM | POA: Diagnosis not present

## 2023-12-19 DIAGNOSIS — N951 Menopausal and female climacteric states: Secondary | ICD-10-CM

## 2023-12-19 DIAGNOSIS — M810 Age-related osteoporosis without current pathological fracture: Secondary | ICD-10-CM | POA: Diagnosis not present

## 2023-12-19 MED ORDER — ESTRADIOL 0.1 MG/GM VA CREA: 1.0000 g | TOPICAL_CREAM | VAGINAL | 3 refills | Status: AC

## 2023-12-19 NOTE — Progress Notes (Signed)
 Lindsey Sanford Feb 05, 1961 [id number]   History:  63 y.o. G3P3 presents for annual exam. Postmenopausal - no bleeding, using vaginal estrogen for dryness/dyspareunia. Feels less stimulation. Stopped Prozax a little over a month ago due to low sodium levels. Sodium has improved. Emotional during visit today. Considering doing a different antidepressant. Normal pap history. Osteoporosis of bilateral hips, declines treatment and is optimizing Vit D + calcium and exercise.    Gynecologic History Patient's last menstrual period was 09/11/2013.   Contraception: post menopausal status Sexually active: Yes  Health maintenance Last Pap: 08/31/2022. Results were: Normal neg HPV Last mammogram: 12/14/2022. Results were: Normal Last colonoscopy: 04/26/2023. Results were: Tubular adenoma, 7-year recall Last Dexa: 08/11/2021. Results were: T-score -2.6 (-2.7 in 2020)     12/19/2023   11:58 AM  Depression screen PHQ 2/9  Decreased Interest 0  Down, Depressed, Hopeless 0  PHQ - 2 Score 0     Past medical history, past surgical history, family history and social history were all reviewed and documented in the EPIC chart. Married. Husband works for Consulting civil engineer housing at [employer]. Patient retired. 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 Caring for elderly mother who lives at independent living facility.   ROS:  A ROS was performed and pertinent positives and negatives are included.  Exam:  Vitals:   12/19/23 1151  BP: 118/70  Pulse: 81  SpO2: 98%  Weight: 140 lb (63.5 kg)  Height: 5' 6.75 (1.695 m)     Body mass index is 22.09 kg/m.  General appearance:  Normal Thyroid :  Symmetrical, normal in size, without palpable masses or nodularity. Respiratory  Auscultation:  Clear without wheezing or rhonchi Cardiovascular  Auscultation:  Regular rate, without rubs, murmurs or gallops  Edema/varicosities:  Not grossly evident Abdominal  Soft,nontender, without  masses, guarding or rebound.  Liver/spleen:  No organomegaly noted  Hernia:  None appreciated  Skin  Inspection:  Grossly normal   Breasts: Examined lying and sitting.   Right: Without masses, retractions, discharge or axillary adenopathy.   Left: Without masses, retractions, discharge or axillary adenopathy. Pelvic: External genitalia:  no lesions              Urethra:  normal appearing urethra with no masses, tenderness or lesions              Bartholins and Skenes: normal                 Vagina: normal appearing vagina with normal color and discharge, no lesions. Mild atrophic changes              Cervix: no lesions Bimanual Exam:  Uterus:  no masses or tenderness              Adnexa: no mass, fullness, tenderness              Rectovaginal: Deferred              Anus:  normal, no lesions  Dereck Keas, CMA present as chaperone.   Assessment/Plan:  63 y.o. G3P3 for annual exam.   Well female exam with routine gynecological exam - Education provided on SBEs, importance of preventative screenings, current guidelines, high calcium diet, regular exercise, and multivitamin daily.  Labs with PCP.   Menopausal vaginal dryness - Plan: estradiol  (ESTRACE ) 0.1 MG/GM vaginal cream twice weekly. Feels less sensitivity with clitoral stimulation. Has been on Prozac  for 5 years, just discontinued. Could improve. Did briefly discuss option  for topical testosterone. Can increase vaginal estrogen to 3 times per week.  Postmenopausal - no HRT, no bleeding  Age-related osteoporosis without current pathological fracture - Plan: DG Bone Density. T-score -2.7 in May 2023. Declines medication management and is optimizing Vit D + calcium and regular exercise.   Screening for cervical cancer - Normal pap history. Will repeat at 5-year interval per guidelines.   Screening for breast cancer - Normal mammogram history.  Continue annual screenings.  Normal breast exam today.  Screening for colon cancer -  04/2023 colonoscopy. Will repeat at GI's recommended interval.   Return in about 1 year (around 12/18/2024) for Annual.        Annabella DELENA [facility], 1:26 PM 12/19/2023

## 2023-12-20 ENCOUNTER — Encounter: Payer: Self-pay | Admitting: Family Medicine

## 2023-12-20 NOTE — Telephone Encounter (Signed)
 I called patient to schedule an appointment to discuss restarting medication. Patient did begin to cry while on the phone with me. She is scheduled tomorrow at 11:20am. I am sending as FYI.

## 2023-12-21 ENCOUNTER — Encounter: Payer: Self-pay | Admitting: Family Medicine

## 2023-12-21 ENCOUNTER — Ambulatory Visit: Admitting: Family Medicine

## 2023-12-21 VITALS — BP 142/78 | HR 66 | Temp 98.2°F | Resp 11 | Ht 66.7 in | Wt 141.0 lb

## 2023-12-21 DIAGNOSIS — F411 Generalized anxiety disorder: Secondary | ICD-10-CM | POA: Diagnosis not present

## 2023-12-21 DIAGNOSIS — E871 Hypo-osmolality and hyponatremia: Secondary | ICD-10-CM

## 2023-12-21 MED ORDER — FLUOXETINE HCL 20 MG PO CAPS: 20.0000 mg | ORAL_CAPSULE | Freq: Every day | ORAL | 1 refills | Status: DC

## 2023-12-21 NOTE — Progress Notes (Signed)
 Subjective:  Patient ID: Lindsey Sanford, female    DOB: 04-20-1960  Age: 63 y.o. MRN: [id number]  CC:  Chief Complaint  Patient presents with   Anxiety    Wants to restart prozac . Crying often    HPI Lindsey Sanford presents for   Anxiety History of anxiety, situational anxiety previously.  Symptoms have typically been worse in the wintertime.  Has used hydroxyzine  if needed, but primarily Prozac  20 mg daily.  Symptoms control was overall stable previously.  She was noted to have hyponatremia, and there was question whether her SSRI may be contributing.  Initially decided to remain on SSRI with continued monitoring of sodium, liberalization of sodium in the diet and electrolyte supplement few days per week.  Decided to try cutting back and tapering off Prozac  in July.  Initially taper down and then off meds. Extra stress after stopping Prozac  - moved mom into assisted living August 19th. Plan for other coping techniques - walking, meditation. Not able to do this initially, but may be able to moving forward.  Unfortunately anxiety symptoms have recurred, more tearful recently, would like to restart meds.  Trouble with caregiving responsibilities with anxiety as well. Has discussed symptoms with her therapist as well.  Most recent sodium level was normal at 135 on September 2.     12/21/2023   12:05 PM 11/02/2023    1:01 PM 08/02/2023   10:24 AM 02/01/2023   10:33 AM  GAD 7 : Generalized Anxiety Score  Nervous, Anxious, on Edge 1 1 1 1   Control/stop worrying 0 0 1 0  Worry too much - different things 1 0 1 1  Trouble relaxing 2 1 1  0  Restless 0 0 1 1  Easily annoyed or irritable 2 1 1 1   Afraid - awful might happen 1 0 1 0  Total GAD 7 Score 7 3 7 4   Anxiety Difficulty Not difficult at all Not difficult at all        12/21/2023   12:05 PM 12/19/2023   11:58 AM 11/02/2023    1:01 PM 08/02/2023   10:24 AM 03/20/2023    2:22 PM  Depression screen PHQ 2/9  Decreased Interest 1 0 1  0 0  Down, Depressed, Hopeless 3 0 1 1 1   PHQ - 2 Score 4 0 2 1 1   Altered sleeping 2  1 1    Tired, decreased energy 1  0 0   Change in appetite 1  0 1   Feeling bad or failure about yourself  3  1 0   Trouble concentrating 1  0 0   Moving slowly or fidgety/restless 0  0 0   Suicidal thoughts 0  0 0   PHQ-9 Score 12  4 3    Difficult doing work/chores Somewhat difficult  Not difficult at all       Lab Results  Component Value Date   NA 135 12/12/2023   K 3.9 12/12/2023   CL 102 12/12/2023   CO2 24 12/12/2023     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   Primary osteoarthritis of both hands 02/22/2016   Primary osteoarthritis of both feet 02/22/2016   Fatigue 02/22/2016   Osteochondroma of foot, right 02/22/2016   Anxiety 07/15/2011  Past Medical History:  Diagnosis Date   Anemia    Anxiety    Depression    Hypertension    Mouth ulcers    most of your life/ due to stress   Osteopenia    Osteoporosis    Past Surgical History:  Procedure Laterality Date   FOOT SURGERY  1985   right foot   HERNIA REPAIR     s   TONSILLECTOMY     30-67 years old   Allergies  Allergen Reactions   Nitrofurantoin Monohyd Macro     Lip swelling   Wasp Venom Hives   Prior to Admission medications   Medication Sig Start Date End Date Taking? Authorizing Provider  amLODipine  (NORVASC ) 2.5 MG tablet TAKE 1 TABLET BY MOUTH EVERY DAY 07/27/23  Yes Levora Reyes SAUNDERS, MD  EPINEPHrine  (AUVI-Q ) 0.3 mg/0.3 mL IJ SOAJ injection Inject 0.3 mg into the muscle as needed for anaphylaxis. 02/17/21  Yes Luke Orlan HERO, DO  estradiol  (ESTRACE ) 0.1 MG/GM vaginal cream Place 1 g vaginally 3 (three) times a week. 12/20/23  Yes Prentiss Riggs A, NP  hydrOXYzine  (ATARAX ) 25 MG tablet  Take 0.5-1 tablets (12.5-25 mg total) by mouth at bedtime as needed for anxiety. 02/01/23  Yes Levora Reyes SAUNDERS, MD  Multiple Vitamin (MULTIVITAMIN) tablet Take 1 tablet by mouth daily. Patient taking differently: Take 1 tablet by mouth as needed.   Yes [provider]  UNABLE TO FIND Med Name: the pause nutrition (omega 3, vitamin d  & k)   Yes [provider]  Iron-Vitamin C (IRON 100/C PO) Take by mouth. Patient not taking: Reported on 12/21/2023    [provider]   Social History   Socioeconomic History   Marital status: Married    Spouse name: Not on file   Number of children: Not on file   Years of education: Not on file   Highest education level: Not on file  Occupational History   Not on file  Tobacco Use   Smoking status: Never    Passive exposure: Past   Smokeless tobacco: Never  Vaping Use   Vaping status: Never Used  Substance and Sexual Activity   Alcohol use: Yes    Alcohol/week: 4.0 standard drinks of alcohol    Types: 4 Glasses of wine per week   Drug use: No   Sexual activity: Yes    Partners: Male    Birth control/protection: Post-menopausal    Comment: INTERCOUSRE AGE 73, SEXUAL PARTNERS LESS THAN 5  Other Topics Concern   Not on file  Social History Narrative   Not on file   Social Drivers of Health   Financial Resource Strain: Not on file  Food Insecurity: Not on file  Transportation Needs: Not on file  Physical Activity: Not on file  Stress: Not on file  Social Connections: Not on file  Intimate Partner Violence: Not on file    Review of Systems  Per HPI.  Objective:   Vitals:   12/21/23 1118 12/21/23 1142  BP: (!) 142/86 (!) 142/78  Pulse: 66   Resp: 11   Temp: 98.2 F (36.8 C)   TempSrc: Temporal   SpO2: 99%   Weight: 141 lb (64 kg)   Height: 5' 6.7 (1.694 m)      Physical Exam Constitutional:      General: She is not in acute distress.    Appearance: Normal appearance. She is well-developed.   HENT:     Head: Normocephalic and atraumatic.  Cardiovascular:     Rate and Rhythm: Normal rate.  Pulmonary:     Effort: Pulmonary effort is normal.  Neurological:     Mental Status: She is alert and oriented to person, place, and time.  Psychiatric:        Mood and Affect: Mood normal.      Assessment & Plan:  Lindsey Sanford is a 63 y.o. female . Hyponatremia - Plan: Basic metabolic panel with GFR, CANCELED: Basic metabolic panel with GFR  Generalized anxiety disorder - Plan: FLUoxetine  (PROZAC ) 20 MG capsule Unfortunately worsened control of anxiety, with situational stressors as above.  May be able to consider trial off meds again in the future but does not appear to be the appropriate time currently.  Restart fluoxetine  20 mg daily.  1 month virtual visit follow-up.  Continue counseling, other coping techniques planned as above including meditation, low intensity exercise.  As far as hyponatremia, recent levels looked okay.  Lab only visit in 2 weeks in case the SSRI may be contributory.  Adjustment plan accordingly.  Meds ordered this encounter  Medications   FLUoxetine  (PROZAC ) 20 MG capsule    Sig: Take 1 capsule (20 mg total) by mouth daily.    Dispense:  90 capsule    Refill:  1   Patient Instructions  Thank you for coming in today.  Lets restart the fluoxetine  at previous dose of 20 mg a day.  I do think that will help along with continued meetings with your therapist.  Most recent electrolytes looked okay.  Lab only visit in 2 weeks and we can recheck sodium level at that time.  Virtual visit with me in 1 month to check and see how things are going but I am happy to see sooner if needed.  Hang in there.     Signed,   Reyes Pines, MD [facility] 12/21/23 12:48 PM

## 2023-12-21 NOTE — Patient Instructions (Signed)
 Thank you for coming in today.  Lets restart the fluoxetine  at previous dose of 20 mg a day.  I do think that will help along with continued meetings with your therapist.  Most recent electrolytes looked okay.  Lab only visit in 2 weeks and we can recheck sodium level at that time.  Virtual visit with me in 1 month to check and see how things are going but I am happy to see sooner if needed.  Hang in there.

## 2024-01-05 ENCOUNTER — Other Ambulatory Visit

## 2024-01-05 DIAGNOSIS — E871 Hypo-osmolality and hyponatremia: Secondary | ICD-10-CM

## 2024-01-05 LAB — BASIC METABOLIC PANEL WITH GFR
BUN: 14 mg/dL (ref 6–23)
CO2: 27 meq/L (ref 19–32)
Calcium: 9 mg/dL (ref 8.4–10.5)
Chloride: 100 meq/L (ref 96–112)
Creatinine, Ser: 0.83 mg/dL (ref 0.40–1.20)
GFR: 75.25 mL/min (ref >60.00)
Glucose, Bld: 96 mg/dL (ref 70–99)
Potassium: 4.2 meq/L (ref 3.5–5.1)
Sodium: 131 meq/L — ABNORMAL LOW (ref 135–145)

## 2024-01-07 ENCOUNTER — Ambulatory Visit: Payer: Self-pay | Admitting: Family Medicine

## 2024-01-16 ENCOUNTER — Other Ambulatory Visit: Payer: Self-pay | Admitting: Family Medicine

## 2024-01-16 ENCOUNTER — Telehealth: Payer: Self-pay

## 2024-01-16 DIAGNOSIS — Z1231 Encounter for screening mammogram for malignant neoplasm of breast: Secondary | ICD-10-CM

## 2024-01-16 NOTE — Telephone Encounter (Signed)
 Patient brought inserts in to be send to [facility] for refurbish  Patient is paid in full can pick up when in  [place]

## 2024-01-22 ENCOUNTER — Ambulatory Visit (INDEPENDENT_AMBULATORY_CARE_PROVIDER_SITE_OTHER)

## 2024-01-22 DIAGNOSIS — F411 Generalized anxiety disorder: Secondary | ICD-10-CM | POA: Diagnosis not present

## 2024-01-22 DIAGNOSIS — Z23 Encounter for immunization: Secondary | ICD-10-CM | POA: Diagnosis not present

## 2024-01-22 DIAGNOSIS — I10 Essential (primary) hypertension: Secondary | ICD-10-CM | POA: Diagnosis not present

## 2024-01-22 DIAGNOSIS — E559 Vitamin D deficiency, unspecified: Secondary | ICD-10-CM | POA: Diagnosis not present

## 2024-01-22 DIAGNOSIS — E785 Hyperlipidemia, unspecified: Secondary | ICD-10-CM | POA: Diagnosis not present

## 2024-01-22 DIAGNOSIS — E871 Hypo-osmolality and hyponatremia: Secondary | ICD-10-CM | POA: Diagnosis not present

## 2024-01-22 NOTE — Progress Notes (Signed)
 Lindsey Sanford is a 63 y.o. female presents in office today for a nurse visit for Flu Vaccine.   Patient Injection was given in the  Left deltoid. Patient tolerated injection well.   Patient's next injection due n/a, appt made? not applicable  [employer]

## 2024-01-23 ENCOUNTER — Ambulatory Visit
Admission: RE | Admit: 2024-01-23 | Discharge: 2024-01-23 | Disposition: A | Discharge status: Home / Self Care | Source: Ambulatory Visit

## 2024-01-23 DIAGNOSIS — Z1231 Encounter for screening mammogram for malignant neoplasm of breast: Secondary | ICD-10-CM

## 2024-01-24 ENCOUNTER — Telehealth (INDEPENDENT_AMBULATORY_CARE_PROVIDER_SITE_OTHER): Admitting: Family Medicine

## 2024-01-24 DIAGNOSIS — F411 Generalized anxiety disorder: Secondary | ICD-10-CM | POA: Diagnosis not present

## 2024-01-24 DIAGNOSIS — E871 Hypo-osmolality and hyponatremia: Secondary | ICD-10-CM | POA: Diagnosis not present

## 2024-01-24 NOTE — Patient Instructions (Signed)
 Glad to hear you are doing better.  Lets stay on the same dose of fluoxetine  for now.  Sodium level was similar to some of your previous readings.  With the low but normal reading off the fluoxetine , it does suggest that that medicine may be contributing somewhat to that reading.  I think we can hold on any changes for now and then recheck labs in 3 months but watch for any new symptoms as we discussed and be seen if that occurs or if there are any questions.  Take care!

## 2024-01-24 NOTE — Progress Notes (Signed)
 Virtual Visit via Video Note  I connected with Lindsey Sanford on 01/24/24 at 10:45 AM by a video enabled telemedicine application and verified that I am speaking with the correct person using two identifiers.  Patient location: home - by self.  My location: office - [place].    I discussed the limitations, risks, security and privacy concerns of performing an evaluation and management service by telephone and the availability of in person appointments. I also discussed with the patient that there may be a patient responsible charge related to this service. The patient expressed understanding and agreed to proceed, consent obtained.   Chief complaint:  Chief Complaint  Patient presents with   Follow-up    Med check. No questions or concerns    History of Present Illness: Lindsey Sanford is a 63 y.o. female  Generalized anxiety disorder: Follow-up from September 11 visit.  Unfortunately worsening control of anxiety off of SSRI which has been discontinued as possible contributor to hyponatremia previously.  Given the stressors and worsening control at that time decided it was most appropriate to restart medications and initially went back to 20 mg daily. Feeling better back on fluoxetine . Less anxiety, able to cope better. No new side effects. Would like to continue same dose.   Hyponatremia Repeat labs with mild hyponatremia.  Possible contributor as above with SSRI.  Sodium 135 on September 2, most recently 131 which has been consistent with her readings over the past year.  No vomiting, nausea, fatigue, lethargy, confusion, forgetfulness, headache, dizziness, gait disturbance, or new muscle cramps, they need to be seen in the ER as these can indicate a more concerning hyponatremia that may need to be treated in the hospital.   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   Primary osteoarthritis of both hands 02/22/2016   Primary osteoarthritis of both feet 02/22/2016   Fatigue 02/22/2016   Osteochondroma of foot, right 02/22/2016   Anxiety 07/15/2011   Past Medical History:  Diagnosis Date   Anemia    Anxiety    Depression    Hypertension    Mouth ulcers    most of your life/ due to stress   Osteopenia    Osteoporosis    Past Surgical History:  Procedure Laterality Date   FOOT SURGERY  1985   right foot   HERNIA REPAIR     s   TONSILLECTOMY     69-89 years old   Allergies  Allergen Reactions   Nitrofurantoin Monohyd Macro     Lip swelling   Wasp Venom Hives   Prior to Admission medications   Medication Sig Start Date End Date Taking? Authorizing Provider  amLODipine  (NORVASC ) 2.5 MG tablet TAKE 1 TABLET BY MOUTH EVERY DAY 07/27/23  Yes Levora Reyes SAUNDERS, MD  EPINEPHrine  (AUVI-Q ) 0.3 mg/0.3 mL IJ SOAJ injection Inject 0.3 mg into the muscle as needed for anaphylaxis. 02/17/21  Yes Luke Orlan HERO, DO  estradiol  (ESTRACE ) 0.1 MG/GM vaginal cream Place 1 g vaginally 3 (three) times a week. 12/20/23  Yes Prentiss Riggs A, NP  FLUoxetine  (PROZAC ) 20 MG  capsule Take 1 capsule (20 mg total) by mouth daily. 12/21/23  Yes Levora Reyes SAUNDERS, MD  hydrOXYzine  (ATARAX ) 25 MG tablet Take 0.5-1 tablets (12.5-25 mg total) by mouth at bedtime as needed for anxiety. 02/01/23  Yes Levora Reyes SAUNDERS, MD  Multiple Vitamin (MULTIVITAMIN) tablet Take 1 tablet by mouth daily. Patient taking differently: Take 1 tablet by mouth as needed.   Yes [provider]  UNABLE TO FIND Med Name: the pause nutrition (omega 3, vitamin d  & k)   Yes [provider]  Iron-Vitamin C (IRON 100/C PO) Take by mouth. Patient not  taking: Reported on 01/24/2024    [provider]   Social History   Socioeconomic History   Marital status: Married    Spouse name: Not on file   Number of children: Not on file   Years of education: Not on file   Highest education level: Bachelor's degree (e.g., BA, AB, BS)  Occupational History   Not on file  Tobacco Use   Smoking status: Never    Passive exposure: Past   Smokeless tobacco: Never  Vaping Use   Vaping status: Never Used  Substance and Sexual Activity   Alcohol use: Yes    Alcohol/week: 4.0 standard drinks of alcohol    Types: 4 Glasses of wine per week   Drug use: No   Sexual activity: Yes    Partners: Male    Birth control/protection: Post-menopausal    Comment: INTERCOUSRE AGE 14, SEXUAL PARTNERS LESS THAN 5  Other Topics Concern   Not on file  Social History Narrative   Not on file   Social Drivers of Health   Financial Resource Strain: Low Risk  (01/24/2024)   Overall Financial Resource Strain (CARDIA)    Difficulty of Paying Living Expenses: Not hard at all  Food Insecurity: No Food Insecurity (01/24/2024)   Hunger Vital Sign    Worried About Running Out of Food in the Last Year: Never true    Ran Out of Food in the Last Year: Never true  Transportation Needs: No Transportation Needs (01/24/2024)   PRAPARE - Administrator, Civil Service (Medical): No    Lack of Transportation (Non-Medical): No  Physical Activity: Insufficiently Active (01/24/2024)   Exercise Vital Sign    Days of Exercise per Week: 4 days    Minutes of Exercise per Session: 30 min  Stress: Stress Concern Present (01/24/2024)   [employer] of Occupational Health - Occupational Stress Questionnaire    Feeling of Stress: Rather much  Social Connections: Socially Integrated (01/24/2024)   Social Connection and Isolation Panel    Frequency of Communication with Friends and Family: Three times a week    Frequency of Social Gatherings with Friends and  Family: Once a week    Attends Religious Services: More than 4 times per year    Active Member of [employer] or Organizations: Yes    Attends Engineer, structural: More than 4 times per year    Marital Status: Married  Catering manager Violence: Not on file    Observations/Objective: There were no vitals filed for this visit. Nontoxic appearance, euthymic mood with affect mood congruent. No respiratory distress, speaking in full sentences.  All questions answered with understanding of plan expressed.  Assessment and Plan: Hyponatremia  - Mild, asymptomatic, and based on prior improvement off of fluoxetine , I suspect that may be partially contributing.  However has required restart of fluoxetine  due to worsening anxiety off meds.  Based on current levels I think it is reasonable to continue monitoring with repeat labs in 3 months and monitor for any symptoms of significant hyponatremia with RTC/ER precautions given.  Generalized anxiety disorder  - Improved with restart of fluoxetine , continue same.  78-month recheck.  Follow Up Instructions:    I discussed the assessment and treatment plan with the patient. The patient was provided an opportunity to ask questions and all were answered. The patient agreed with the plan and demonstrated an understanding of the instructions.   The patient was advised to call back or seek an in-person evaluation if the symptoms worsen or if the condition fails to improve as anticipated.   Reyes JONELLE Pines, MD

## 2024-03-11 ENCOUNTER — Encounter: Payer: Self-pay | Admitting: Family Medicine

## 2024-03-12 NOTE — Telephone Encounter (Signed)
 Patient is taking 20 mg of Fluoxetine  daily. Some days she takes a quarter tablet of  25mg  hydroxyzine  in the morning. She is wondering if that is okay? If so, how often should she take it?

## 2024-04-18 ENCOUNTER — Other Ambulatory Visit (HOSPITAL_BASED_OUTPATIENT_CLINIC_OR_DEPARTMENT_OTHER)

## 2024-05-02 ENCOUNTER — Encounter: Payer: Self-pay | Admitting: Family Medicine

## 2024-05-02 ENCOUNTER — Ambulatory Visit: Admitting: Family Medicine

## 2024-05-02 VITALS — BP 114/66 | HR 79 | Temp 98.2°F | Resp 16 | Ht 66.7 in | Wt 131.6 lb

## 2024-05-02 DIAGNOSIS — E785 Hyperlipidemia, unspecified: Secondary | ICD-10-CM

## 2024-05-02 DIAGNOSIS — I10 Essential (primary) hypertension: Secondary | ICD-10-CM | POA: Diagnosis not present

## 2024-05-02 DIAGNOSIS — F411 Generalized anxiety disorder: Secondary | ICD-10-CM | POA: Diagnosis not present

## 2024-05-02 DIAGNOSIS — E871 Hypo-osmolality and hyponatremia: Secondary | ICD-10-CM

## 2024-05-02 DIAGNOSIS — E559 Vitamin D deficiency, unspecified: Secondary | ICD-10-CM | POA: Diagnosis not present

## 2024-05-02 LAB — COMPREHENSIVE METABOLIC PANEL WITH GFR
ALT: 14 U/L (ref 3–35)
AST: 17 U/L (ref 5–37)
Albumin: 4.2 g/dL (ref 3.5–5.2)
Alkaline Phosphatase: 83 U/L (ref 39–117)
BUN: 14 mg/dL (ref 6–23)
CO2: 27 meq/L (ref 19–32)
Calcium: 9.5 mg/dL (ref 8.4–10.5)
Chloride: 100 meq/L (ref 96–112)
Creatinine, Ser: 0.73 mg/dL (ref 0.40–1.20)
GFR: 87.59 mL/min
Glucose, Bld: 84 mg/dL (ref 70–99)
Potassium: 4.2 meq/L (ref 3.5–5.1)
Sodium: 134 meq/L — ABNORMAL LOW (ref 135–145)
Total Bilirubin: 0.3 mg/dL (ref 0.2–1.2)
Total Protein: 7 g/dL (ref 6.0–8.3)

## 2024-05-02 LAB — LIPID PANEL
Cholesterol: 264 mg/dL — ABNORMAL HIGH (ref 28–200)
HDL: 79.5 mg/dL
LDL Cholesterol: 156 mg/dL — ABNORMAL HIGH (ref 10–99)
NonHDL: 184.1
Total CHOL/HDL Ratio: 3
Triglycerides: 142 mg/dL (ref 10.0–149.0)
VLDL: 28.4 mg/dL (ref 0.0–40.0)

## 2024-05-02 LAB — VITAMIN D 25 HYDROXY (VIT D DEFICIENCY, FRACTURES): VITD: 50.04 ng/mL (ref 30.00–100.00)

## 2024-05-02 MED ORDER — FLUOXETINE HCL 20 MG PO CAPS: 20.0000 mg | ORAL_CAPSULE | Freq: Every day | ORAL | 1 refills | Status: AC

## 2024-05-02 MED ORDER — AMLODIPINE BESYLATE 2.5 MG PO TABS: 2.5000 mg | ORAL_TABLET | Freq: Every day | ORAL | 3 refills | Status: AC

## 2024-05-02 NOTE — Addendum Note (Signed)
 Addended by: Edson Deridder R on: 05/02/2024 01:35 PM   Modules accepted: Orders

## 2024-05-02 NOTE — Progress Notes (Signed)
 "  Subjective:  Patient ID: Lindsey Sanford, female    DOB: January 08, 1961  Age: 64 y.o. MRN: [id number]  CC:  Chief Complaint  Patient presents with   Follow-up    6 month follow up. No questions or concerns.     HPI Lindsey Sanford presents for   Generalized anxiety disorder Follow-up from October.  We had tried stopping SSRI temporarily as possible contributor to hyponatremia but worsening control symptoms and increase in stressors, restarted fluoxetine  at 20 mg/day.  Less anxiety and better coping at that October visit.  Continued on same dose.  Previously had used hydroxyzine  as needed for sleep or anxiety. Doing ok. Same stressors. Does not want to increase dose at this time.  Still working with therapist every other week.    Hyponatremia Mild on repeat testing, overall stable.  Question of SSRI contribution but need to continue meds as above.  We have discussed possible symptoms of severe hyponatremia with ER precautions. Lab Results  Component Value Date   NA 131 (L) 01/05/2024   K 4.2 01/05/2024   CL 100 01/05/2024   CO2 27 01/05/2024   Hypertension: Amlodipine  2.5 mg daily. Home readings: BP Readings from Last 3 Encounters:  05/02/24 114/66  12/21/23 (!) 142/78  12/19/23 118/70   Lab Results  Component Value Date   CREATININE 0.83 01/05/2024   History of Vitamin D  Deficiency: Noted years ago, has been on otc Vit D 2000u/day.  Last vitamin D  Lab Results  Component Value Date   VD25OH 46 01/24/2018     Hyperlipidemia: No statins/meds.  Father with heart disease - sudden cardiac death at age 62.  The 10-year ASCVD risk score (Arnett DK, et al., 2019) is: 4.1%   Values used to calculate the score:     Age: 17 years     Clinically relevant sex: Female     Is Non-Hispanic African American: No     Diabetic: No     Tobacco smoker: No     Systolic Blood Pressure: 114 mmHg     Is BP treated: Yes     HDL Cholesterol: 86.6 mg/dL     Total Cholesterol: 226  mg/dL  Lab Results  Component Value Date   CHOL 226 (H) 08/01/2023   HDL 86.60 08/01/2023   LDLCALC 127 (H) 08/01/2023   TRIG 60.0 08/01/2023   CHOLHDL 3 08/01/2023   Lab Results  Component Value Date   ALT 15 08/01/2023   AST 18 08/01/2023   ALKPHOS 66 08/01/2023   BILITOT 0.3 08/01/2023   HM: declines pna vaccine Pap at GYN up to date - Lindsey Sanford.    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 Authorizing Provider  amLODipine  (NORVASC ) 2.5 MG tablet TAKE 1 TABLET BY MOUTH EVERY DAY 07/27/23  Yes Levora Reyes SAUNDERS, MD  EPINEPHrine  (AUVI-Q ) 0.3 mg/0.3 mL IJ SOAJ injection Inject 0.3 mg into the muscle as needed for anaphylaxis. 02/17/21  Yes Luke Orlan HERO, DO  estradiol  (ESTRACE ) 0.1 MG/GM vaginal cream Place 1 g vaginally 3 (three) times a week. 12/20/23  Yes Prentiss Riggs A, NP  FLUoxetine  (PROZAC ) 20 MG capsule Take 1 capsule (20 mg total) by mouth daily. 12/21/23  Yes Levora Reyes SAUNDERS, MD  hydrOXYzine  (ATARAX ) 25 MG tablet Take 0.5-1 tablets (12.5-25 mg total) by mouth at bedtime as needed for anxiety. 02/01/23  Yes Levora Reyes SAUNDERS, MD  UNABLE TO FIND Med  Name: the pause nutrition (omega 3, vitamin d  & k)   Yes [provider]  Multiple Vitamin (MULTIVITAMIN) tablet Take 1 tablet by mouth daily. Patient not taking: Reported on 05/02/2024    [provider]   Social History   Socioeconomic History   Marital status: Married    Spouse name: Not on file   Number of children: Not on file   Years of education: Not on file   Highest education level: Bachelor's degree (e.g., BA, AB, BS)  Occupational History   Not on file  Tobacco Use   Smoking status: Never    Passive exposure: Past   Smokeless tobacco: Never  Vaping Use   Vaping status: Never Used  Substance and Sexual Activity   Alcohol use: Yes    Alcohol/week: 4.0 standard drinks of alcohol    Types: 4 Glasses of wine per week   Drug use: No   Sexual activity: Yes    Partners: Male    Birth control/protection: Post-menopausal    Comment: INTERCOUSRE AGE 27, SEXUAL PARTNERS LESS THAN 5  Other Topics Concern   Not on file  Social History Narrative   Not on file   Social Drivers of Health   Tobacco Use: Low Risk (05/02/2024)   Patient History    Smoking Tobacco Use: Never    Smokeless Tobacco Use: Never    Passive Exposure: Past  Financial Resource Strain: Low Risk (01/24/2024)   Overall Financial Resource Strain (CARDIA)    Difficulty of Paying Living Expenses: Not hard at all  Food Insecurity: No Food Insecurity (01/24/2024)   Epic    Worried About Programme Researcher, Broadcasting/film/video in the Last Year: Never true    Ran Out of Food in the Last Year: Never true  Transportation Needs: No Transportation Needs (01/24/2024)   Epic    Lack of Transportation (Medical): No    Lack of Transportation (Non-Medical): No  Physical Activity: Insufficiently Active (01/24/2024)   Exercise Vital Sign    Days of Exercise per Week: 4 days    Minutes of Exercise per Session: 30 min  Stress: Stress Concern Present (01/24/2024)   [employer] of Occupational Health - Occupational  Stress Questionnaire    Feeling of Stress: Rather much  Social Connections: Socially Integrated (01/24/2024)   Social Connection and Isolation Panel    Frequency of Communication with Friends and Family: Three times a week    Frequency of Social Gatherings with Friends and Family: Once a week    Attends Religious Services: More than 4 times per year    Active Member of Clubs or  Organizations: Yes    Attends Engineer, Structural: More than 4 times per year    Marital Status: Married  Catering Manager Violence: Not on file  Depression (PHQ2-9): High Risk (12/21/2023)   Depression (PHQ2-9)    PHQ-2 Score: 12  Alcohol Screen: Low Risk (01/24/2024)   Alcohol Screen    Last Alcohol Screening Score (AUDIT): 5  Housing: Low Risk (01/24/2024)   Epic    Unable to Pay for Housing in the Last Year: No    Number of Times Moved in the Last Year: 0    Homeless in the Last Year: No  Utilities: Not on file  Health Literacy: Not on file    Review of Systems  Constitutional:  Negative for fatigue and unexpected weight change.  Respiratory:  Negative for chest tightness and shortness of breath.   Cardiovascular:  Negative for chest pain, palpitations and leg swelling.  Gastrointestinal:  Negative for abdominal pain and blood in stool.  Neurological:  Negative for dizziness, syncope, light-headedness and headaches.     Objective:   Vitals:   05/02/24 1306  BP: 114/66  Pulse: 79  Resp: 16  Temp: 98.2 F (36.8 C)  TempSrc: Temporal  SpO2: 97%  Weight: 131 lb 9.6 oz (59.7 kg)  Height: 5' 6.7 (1.694 m)     Physical Exam Vitals reviewed.  Constitutional:      Appearance: Normal appearance. She is well-developed.  HENT:     Head: Normocephalic and atraumatic.  Eyes:     Conjunctiva/sclera: Conjunctivae normal.     Pupils: Pupils are equal, round, and reactive to light.  Neck:     Vascular: No carotid bruit.  Cardiovascular:     Rate and Rhythm: Normal rate and regular  rhythm.     Heart sounds: Normal heart sounds.  Pulmonary:     Effort: Pulmonary effort is normal.     Breath sounds: Normal breath sounds.  Abdominal:     Palpations: Abdomen is soft. There is no pulsatile mass.     Tenderness: There is no abdominal tenderness.  Musculoskeletal:     Right lower leg: No edema.     Left lower leg: No edema.  Skin:    General: Skin is warm and dry.  Neurological:     Mental Status: She is alert and oriented to person, place, and time.  Psychiatric:        Mood and Affect: Mood normal.        Behavior: Behavior normal.        Assessment & Plan:  Lindsey Sanford is a 64 y.o. female . Hyponatremia  - Mild, stable on most recent testing, updated testing ordered.  Can look at meds, SSRI again as possible culprit but given her anxiety and stressors at this time I do not think we should stop meds currently.  ER precautions given.  62-month follow-up is stable.  Essential hypertension - Plan: Comprehensive metabolic panel with GFR  - Stable on current regimen, continue amlodipine , check labs and adjust plan accordingly.  Generalized anxiety disorder  - Overall stable with counseling, current dose of fluoxetine  but did discuss option of higher dosing of 40 mg daily if persistent or worsening anxiety symptom control.  28-month follow-up is stable.  Hyperlipidemia, unspecified hyperlipidemia type - Plan: Comprehensive metabolic panel with GFR, Lipid panel  - Low ASCVD risk score.  Check labs and adjust plan accordingly.  No new meds for now  Vitamin D  deficiency - Plan: Vitamin D  (  25 hydroxy)  - Reports history of vitamin D  deficiency, improved on over-the-counter supplementation.  Check updated labs and adjust plan accordingly.  No orders of the defined types were placed in this encounter.  Patient Instructions  Thank you for coming in today. No change in medications at this time. If there are any concerns on your bloodwork, I will let you know. Take  care!     Signed,   Reyes Pines, MD [facility], [facility] 05/02/24 1:33 PM      [1]  Allergies Allergen Reactions   Nitrofurantoin Monohyd Macro     Lip swelling   Wasp Venom Hives   "

## 2024-05-02 NOTE — Patient Instructions (Signed)
 Thank you for coming in today. No change in medications at this time. If there are any concerns on your bloodwork, I will let you know. Take care!

## 2024-05-04 ENCOUNTER — Ambulatory Visit: Payer: Self-pay | Admitting: Family Medicine

## 2024-09-04 ENCOUNTER — Ambulatory Visit (HOSPITAL_BASED_OUTPATIENT_CLINIC_OR_DEPARTMENT_OTHER)

## 2024-11-06 ENCOUNTER — Encounter: Admitting: Family Medicine
